# Patient Record
Sex: Female | Born: 1980 | Race: White | Hispanic: No | Marital: Single | Smoking: Former smoker
Health system: Southern US, Community
[De-identification: ages and names within clinical notes are randomized; demographics above are authoritative.]

## PROBLEM LIST (undated history)

## (undated) DIAGNOSIS — C801 Malignant (primary) neoplasm, unspecified: Secondary | ICD-10-CM

## (undated) DIAGNOSIS — G43909 Migraine, unspecified, not intractable, without status migrainosus: Secondary | ICD-10-CM

## (undated) DIAGNOSIS — F419 Anxiety disorder, unspecified: Secondary | ICD-10-CM

## (undated) DIAGNOSIS — Z9889 Other specified postprocedural states: Secondary | ICD-10-CM

## (undated) DIAGNOSIS — R112 Nausea with vomiting, unspecified: Secondary | ICD-10-CM

## (undated) HISTORY — DX: Migraine, unspecified, not intractable, without status migrainosus: G43.909

## (undated) HISTORY — PX: BILATERAL SALPINGECTOMY: SHX5743

---

## 2004-08-05 DIAGNOSIS — M5030 Other cervical disc degeneration, unspecified cervical region: Secondary | ICD-10-CM | POA: Insufficient documentation

## 2004-08-05 DIAGNOSIS — G43919 Migraine, unspecified, intractable, without status migrainosus: Secondary | ICD-10-CM | POA: Insufficient documentation

## 2006-07-23 DIAGNOSIS — N926 Irregular menstruation, unspecified: Secondary | ICD-10-CM | POA: Insufficient documentation

## 2018-07-12 DIAGNOSIS — F419 Anxiety disorder, unspecified: Secondary | ICD-10-CM | POA: Insufficient documentation

## 2019-03-18 DIAGNOSIS — F4323 Adjustment disorder with mixed anxiety and depressed mood: Secondary | ICD-10-CM | POA: Insufficient documentation

## 2020-11-12 ENCOUNTER — Ambulatory Visit (HOSPITAL_COMMUNITY)
Admission: EM | Admit: 2020-11-12 | Discharge: 2020-11-12 | Disposition: A | Discharge status: Home / Self Care | Payer: 59 | Attending: Emergency Medicine | Admitting: Emergency Medicine

## 2020-11-12 ENCOUNTER — Ambulatory Visit (INDEPENDENT_AMBULATORY_CARE_PROVIDER_SITE_OTHER): Payer: 59

## 2020-11-12 ENCOUNTER — Other Ambulatory Visit: Payer: Self-pay

## 2020-11-12 ENCOUNTER — Encounter (HOSPITAL_COMMUNITY): Payer: Self-pay

## 2020-11-12 DIAGNOSIS — M25512 Pain in left shoulder: Secondary | ICD-10-CM

## 2020-11-12 MED ORDER — KETOROLAC TROMETHAMINE 60 MG/2ML IM SOLN
60.0000 mg | Freq: Once | INTRAMUSCULAR | Status: AC
  Administered 2020-11-12: 60 mg via INTRAMUSCULAR

## 2020-11-12 MED ORDER — PREDNISONE 10 MG (21) PO TBPK: ORAL_TABLET | Freq: Every day | ORAL | 0 refills | Status: DC

## 2020-11-12 MED ORDER — KETOROLAC TROMETHAMINE 60 MG/2ML IM SOLN
INTRAMUSCULAR | Status: AC
  Filled 2020-11-12: qty 2

## 2020-11-12 NOTE — ED Triage Notes (Signed)
Pt reports left scapular pain x 3 days. States pain started at work when she was lifting some heavy boxes. Pain is worse when taking deep breath. Naproxen ibuprofen gives no relief.

## 2020-11-12 NOTE — Discharge Instructions (Addendum)
Your xray was negative you will need to follow up with orthopedic for further test and to r/o any other causes for pain  Take motrin and the steroids to help with the swelling and pain  Rest area for 3 days to help with the pain and swelling

## 2020-11-12 NOTE — ED Provider Notes (Signed)
[facility]    CSN: [id number] Arrival date & time: 11/12/20  1020      History   Chief Complaint Chief Complaint  Patient presents with   scapular pain    HPI Corrine Nagele is a 40 y.o. female.   Works at [employer] on fri was lifting above her head to get a box, began having pain to lt shoulder blade area. Not able to lift left arm well, has numbness and pain more in shoulder blade area. Has take motrin, naproxen, and ice  with no relief.   History reviewed. No pertinent past medical history.  There are no problems to display for this patient.   History reviewed. No pertinent surgical history.  OB History   No obstetric history on file.      Home Medications    Prior to Admission medications   Medication Sig Start Date End Date Taking? Authorizing Provider  predniSONE (STERAPRED UNI-PAK 21 TAB) 10 MG (21) TBPK tablet Take by mouth daily. Take 6 tabs by mouth daily  for 2 days, then 5 tabs for 2 days, then 4 tabs for 2 days, then 3 tabs for 2 days, 2 tabs for 2 days, then 1 tab by mouth daily for 2 days 11/12/20  Yes Coralyn Mark, NP    Family History History reviewed. No pertinent family history.  Social History Social History   Tobacco Use   Smoking status: Never   Smokeless tobacco: Never  Substance Use Topics   Alcohol use: Yes   Drug use: Never     Allergies   Amitriptyline and Contrast media [iodinated diagnostic agents]   Review of Systems Review of Systems  Respiratory: Negative.    Cardiovascular: Negative.   Gastrointestinal: Negative.   Genitourinary: Negative.   Musculoskeletal:  Positive for joint swelling.       Lt shoulder blade scapular area   Skin:        Swelling to lt shoulder blade area radiates to under arm pit   Neurological:  Positive for weakness and numbness.       Lt arm and shoulder blade area     Physical Exam Triage Vital Signs ED Triage Vitals  Enc Vitals Group     BP 11/12/20 1136 (!)  143/94     Pulse Rate 11/12/20 1136 69     Resp 11/12/20 1136 18     Temp 11/12/20 1136 98.6 F (37 C)     Temp Source 11/12/20 1136 Oral     SpO2 11/12/20 1136 99 %     Weight --      Height --      Head Circumference --      Peak Flow --      Pain Score 11/12/20 1134 8     Pain Loc --      Pain Edu? --      Excl. in GC? --    No data found.  Updated Vital Signs BP (!) 143/94 (BP Location: Right Arm)   Pulse 69   Temp 98.6 F (37 C) (Oral)   Resp 18   LMP  (Within Weeks) Comment: 2 1/2 weeks  SpO2 99%   Visual Acuity Right Eye Distance:   Left Eye Distance:   Bilateral Distance:    Right Eye Near:   Left Eye Near:    Bilateral Near:     Physical Exam Constitutional:      General: She is in acute distress.  Cardiovascular:  Rate and Rhythm: Normal rate.  Pulmonary:     Effort: Pulmonary effort is normal.  Abdominal:     General: Abdomen is flat.  Musculoskeletal:        General: Swelling, tenderness and signs of injury present.     Cervical back: Normal range of motion.     Comments: Decrease rom with extension to lt arm, lt shoulder scapula are slight edema, pain on palpation. Strong pulses.   Skin:    Capillary Refill: Capillary refill takes less than 2 seconds.     Findings: No erythema.  Neurological:     General: No focal deficit present.     Mental Status: She is alert.     UC Treatments / Results  Labs (all labs ordered are listed, but only abnormal results are displayed) Labs Reviewed - No data to display  EKG   Radiology DG Shoulder Left  Result Date: 11/12/2020 CLINICAL DATA:  Pain EXAM: LEFT SHOULDER - 2+ VIEW COMPARISON:  None. FINDINGS: There is no evidence of fracture or dislocation. There is no evidence of arthropathy or other focal bone abnormality. Soft tissues are unremarkable. IMPRESSION: Negative. Electronically Signed   By: Allegra Lai M.D.   On: 11/12/2020 12:42    Procedures Procedures (including critical care  time)  Medications Ordered in UC Medications  ketorolac (TORADOL) injection 60 mg (60 mg Intramuscular Given 11/12/20 1223)    Initial Impression / Assessment and Plan / UC Course  I have reviewed the triage vital signs and the nursing notes.  Pertinent labs & imaging results that were available during my care of the patient were reviewed by me and considered in my medical decision making (see chart for details).     Your xray was negative you will need to follow up with orthopedic for further test and to r/o any other causes for pain  Take motrin and the steroids to help with the swelling and pain  Final Clinical Impressions(s) / UC Diagnoses   Final diagnoses:  Pain in joint of left shoulder     Discharge Instructions      Your xray was negative you will need to follow up with orthopedic for further test and to r/o any other causes for pain  Take motrin and the steroids to help with the swelling and pain       ED Prescriptions     Medication Sig Dispense Auth. Provider   predniSONE (STERAPRED UNI-PAK 21 TAB) 10 MG (21) TBPK tablet Take by mouth daily. Take 6 tabs by mouth daily  for 2 days, then 5 tabs for 2 days, then 4 tabs for 2 days, then 3 tabs for 2 days, 2 tabs for 2 days, then 1 tab by mouth daily for 2 days 42 tablet Coralyn Mark, NP      PDMP not reviewed this encounter.   Coralyn Mark, NP 11/12/20 1253

## 2020-12-16 ENCOUNTER — Emergency Department (HOSPITAL_COMMUNITY)
Admission: EM | Admit: 2020-12-16 | Discharge: 2020-12-16 | Discharge status: Against Medical Advice | Payer: Managed Care, Other (non HMO) | Source: Home / Self Care

## 2021-10-08 ENCOUNTER — Other Ambulatory Visit: Payer: Self-pay | Admitting: Orthopaedic Surgery

## 2021-10-08 DIAGNOSIS — M25572 Pain in left ankle and joints of left foot: Secondary | ICD-10-CM

## 2021-10-22 ENCOUNTER — Ambulatory Visit
Admission: RE | Admit: 2021-10-22 | Discharge: 2021-10-22 | Disposition: A | Discharge status: Home / Self Care | Payer: BC Managed Care – PPO | Source: Ambulatory Visit | Attending: Emergency Medicine | Admitting: Emergency Medicine

## 2021-10-22 VITALS — BP 153/85 | HR 70 | Temp 98.5°F | Resp 18

## 2021-10-22 DIAGNOSIS — R110 Nausea: Secondary | ICD-10-CM

## 2021-10-22 DIAGNOSIS — A084 Viral intestinal infection, unspecified: Secondary | ICD-10-CM | POA: Diagnosis not present

## 2021-10-22 DIAGNOSIS — N926 Irregular menstruation, unspecified: Secondary | ICD-10-CM

## 2021-10-22 LAB — POCT URINALYSIS DIP (MANUAL ENTRY)
Bilirubin, UA: NEGATIVE
Blood, UA: NEGATIVE
Glucose, UA: NEGATIVE mg/dL
Ketones, POC UA: NEGATIVE mg/dL
Nitrite, UA: NEGATIVE
Protein Ur, POC: NEGATIVE mg/dL
Spec Grav, UA: 1.025 (ref 1.010–1.025)
Urobilinogen, UA: 0.2 E.U./dL
pH, UA: 6 (ref 5.0–8.0)

## 2021-10-22 MED ORDER — LOPERAMIDE HCL 2 MG PO TABS: 4.0000 mg | ORAL_TABLET | Freq: Four times a day (QID) | ORAL | 0 refills | Status: DC | PRN

## 2021-10-22 MED ORDER — ONDANSETRON 8 MG PO TBDP: 8.0000 mg | ORAL_TABLET | Freq: Three times a day (TID) | ORAL | 0 refills | Status: DC | PRN

## 2021-10-22 NOTE — Discharge Instructions (Signed)
Your urinalysis was not concerning for any signs of dehydration or urinary tract infection.  I sent a prescription for Zofran to your pharmacy that you can take every 8 hours as needed for nausea.  I sent a prescription for loperamide that you can take with every episode of diarrhea.  This can be taken up to 4 times daily.  Please do not take this medication unless you have an episode of diarrhea.  Please follow-up with either your primary care provider or your gynecologist regarding your irregular periods.  Thank you for visiting urgent care today.

## 2021-10-22 NOTE — ED Provider Notes (Signed)
[facility]    CSN: [id number] Arrival date & time: 10/22/21  1500    HISTORY   Chief Complaint  Patient presents with   Diarrhea    Iregular menstrual bleeding Vomiting - Entered by patient   Emesis   HPI Vanessa Murphy is a pleasant, 41 y.o. female who presents to urgent care today. Pt c/o intermittent episodes of vomiting and diarrhea for the past 6 days.  Patient states she threw up 3 times and had 4 episodes of diarrhea yesterday but adds that she is able to keep fluids down at this time.  Patient states she is only feeling nauseated currently.  Patient states she took some nausea medication around 9:30 AM today.  Patient also complains of menstrual bleeding beginning on 8/16, lasting for a few days then starting again on 9/1.  Patient states she currently is still spotting.  The history is provided by the patient.   History reviewed. No pertinent past medical history. There are no problems to display for this patient.  History reviewed. No pertinent surgical history. OB History   No obstetric history on file.    Home Medications    Prior to Admission medications   Not on File    Family History History reviewed. No pertinent family history. Social History Social History   Tobacco Use   Smoking status: Never   Smokeless tobacco: Never  Substance Use Topics   Alcohol use: Yes   Drug use: Never   Allergies   Amitriptyline and Contrast media [iodinated contrast media]  Review of Systems Review of Systems Pertinent findings revealed after performing a 14 point review of systems has been noted in the history of present illness.  Physical Exam Triage Vital Signs ED Triage Vitals  Enc Vitals Group     BP 12/07/20 0827 (!) 147/82     Pulse Rate 12/07/20 0827 72     Resp 12/07/20 0827 18     Temp 12/07/20 0827 98.3 F (36.8 C)     Temp Source 12/07/20 0827 Oral     SpO2 12/07/20 0827 98 %     Weight --      Height --      Head  Circumference --      Peak Flow --      Pain Score 12/07/20 0826 5     Pain Loc --      Pain Edu? --      Excl. in GC? --   No data found.  Updated Vital Signs BP (!) 153/85 (BP Location: Left Arm)   Pulse 70   Temp 98.5 F (36.9 C) (Oral)   Resp 18   SpO2 96%   Physical Exam Vitals and nursing note reviewed.  Constitutional:      General: She is not in acute distress.    Appearance: Normal appearance. She is not ill-appearing.  HENT:     Head: Normocephalic and atraumatic.  Eyes:     General: Lids are normal.        Right eye: No discharge.        Left eye: No discharge.     Extraocular Movements: Extraocular movements intact.     Conjunctiva/sclera: Conjunctivae normal.     Right eye: Right conjunctiva is not injected.     Left eye: Left conjunctiva is not injected.  Neck:     Trachea: Trachea and phonation normal.  Cardiovascular:     Rate and Rhythm: Normal rate and regular rhythm.  Pulses: Normal pulses.     Heart sounds: Normal heart sounds. No murmur heard.    No friction rub. No gallop.  Pulmonary:     Effort: Pulmonary effort is normal. No accessory muscle usage, prolonged expiration or respiratory distress.     Breath sounds: Normal breath sounds. No stridor, decreased air movement or transmitted upper airway sounds. No decreased breath sounds, wheezing, rhonchi or rales.  Chest:     Chest wall: No tenderness.  Abdominal:     General: Abdomen is flat. Bowel sounds are normal. There is no distension.     Palpations: Abdomen is soft. There is no mass.     Tenderness: There is no abdominal tenderness. There is no right CVA tenderness, left CVA tenderness, guarding or rebound.     Hernia: No hernia is present.  Musculoskeletal:        General: Normal range of motion.     Cervical back: Normal range of motion and neck supple. Normal range of motion.  Lymphadenopathy:     Cervical: No cervical adenopathy.  Skin:    General: Skin is warm and dry.      Findings: No erythema or rash.  Neurological:     General: No focal deficit present.     Mental Status: She is alert and oriented to person, place, and time.  Psychiatric:        Mood and Affect: Mood normal.        Behavior: Behavior normal.     Visual Acuity Right Eye Distance:   Left Eye Distance:   Bilateral Distance:    Right Eye Near:   Left Eye Near:    Bilateral Near:     UC Couse / Diagnostics / Procedures:     Radiology No results found.  Procedures Procedures (including critical care time) EKG  Pending results:  Labs Reviewed  POCT URINALYSIS DIP (MANUAL ENTRY) - Abnormal; Notable for the following components:      Result Value   Leukocytes, UA Trace (*)    All other components within normal limits    Medications Ordered in UC: Medications - No data to display  UC Diagnoses / Final Clinical Impressions(s)   I have reviewed the triage vital signs and the nursing notes.  Pertinent labs & imaging results that were available during my care of the patient were reviewed by me and considered in my medical decision making (see chart for details).    Final diagnoses:  Nausea without vomiting  Viral gastroenteritis  Irregular menses   Urinalysis was unremarkable.  Patient provided with Zofran and Immodium for nausea, vomiting and diarrhea.  Patient advised to follow-up with her gynecologist regarding menstrual irregularities.  Return precautions advised.  ED Prescriptions     Medication Sig Dispense Auth. Provider   ondansetron (ZOFRAN-ODT) 8 MG disintegrating tablet Take 1 tablet (8 mg total) by mouth every 8 (eight) hours as needed for nausea or vomiting. 20 tablet Lynden Oxford Scales, PA-C   loperamide (IMODIUM A-D) 2 MG tablet Take 2 tablets (4 mg total) by mouth 4 (four) times daily as needed for diarrhea or loose stools. 30 tablet Lynden Oxford Scales, PA-C      PDMP not reviewed this encounter.  Pending results:  Labs Reviewed  POCT  URINALYSIS DIP (MANUAL ENTRY) - Abnormal; Notable for the following components:      Result Value   Leukocytes, UA Trace (*)    All other components within normal limits    Discharge Instructions:  Discharge Instructions      Your urinalysis was not concerning for any signs of dehydration or urinary tract infection.  I sent a prescription for Zofran to your pharmacy that you can take every 8 hours as needed for nausea.  I sent a prescription for loperamide that you can take with every episode of diarrhea.  This can be taken up to 4 times daily.  Please do not take this medication unless you have an episode of diarrhea.  Please follow-up with either your primary care provider or your gynecologist regarding your irregular periods.  Thank you for visiting urgent care today.      Disposition Upon Discharge:  Condition: stable for discharge home  Patient presented with an acute illness with associated systemic symptoms and significant discomfort requiring urgent management. In my opinion, this is a condition that a prudent lay person (someone who possesses an average knowledge of health and medicine) may potentially expect to result in complications if not addressed urgently such as respiratory distress, impairment of bodily function or dysfunction of bodily organs.   Routine symptom specific, illness specific and/or disease specific instructions were discussed with the patient and/or caregiver at length.   As such, the patient has been evaluated and assessed, work-up was performed and treatment was provided in alignment with urgent care protocols and evidence based medicine.  Patient/parent/caregiver has been advised that the patient may require follow up for further testing and treatment if the symptoms continue in spite of treatment, as clinically indicated and appropriate.  Patient/parent/caregiver has been advised to return to the [facility] or PCP if no better; to PCP or the Emergency  Department if new signs and symptoms develop, or if the current signs or symptoms continue to change or worsen for further workup, evaluation and treatment as clinically indicated and appropriate  The patient will follow up with their current PCP if and as advised. If the patient does not currently have a PCP we will assist them in obtaining one.   The patient may need specialty follow up if the symptoms continue, in spite of conservative treatment and management, for further workup, evaluation, consultation and treatment as clinically indicated and appropriate.   Patient/parent/caregiver verbalized understanding and agreement of plan as discussed.  All questions were addressed during visit.  Please see discharge instructions below for further details of plan.  This office note has been dictated using Teaching laboratory technician.  Unfortunately, this method of dictation can sometimes lead to typographical or grammatical errors.  I apologize for your inconvenience in advance if this occurs.  Please do not hesitate to reach out to me if clarification is needed.      Theadora Rama Scales, PA-C 10/22/21 1553

## 2021-10-22 NOTE — ED Triage Notes (Addendum)
Pt c/o vomiting and diarrhea intermittent since last Thursday. States vomit x3 and diarrhea x4 today. States able to keep fluids down. States having nausea now. Took nausea meds at 9:30am.  Pt states having irregular menstrual cycle this month. States last cycle was 8/16 and started again on 9/1 and still bleeding some.

## 2021-10-23 ENCOUNTER — Ambulatory Visit
Admission: RE | Admit: 2021-10-23 | Discharge: 2021-10-23 | Disposition: A | Discharge status: Home / Self Care | Payer: 59 | Source: Ambulatory Visit | Attending: Orthopaedic Surgery | Admitting: Orthopaedic Surgery

## 2021-10-23 DIAGNOSIS — M25572 Pain in left ankle and joints of left foot: Secondary | ICD-10-CM

## 2022-01-16 IMAGING — DX DG SHOULDER 2+V*L*
3 series · 3 of 3 positions shown · non-contrast
Comparison: None.

CLINICAL DATA: Pain

EXAM:
LEFT SHOULDER - 2+ VIEW

[shoulder ap]
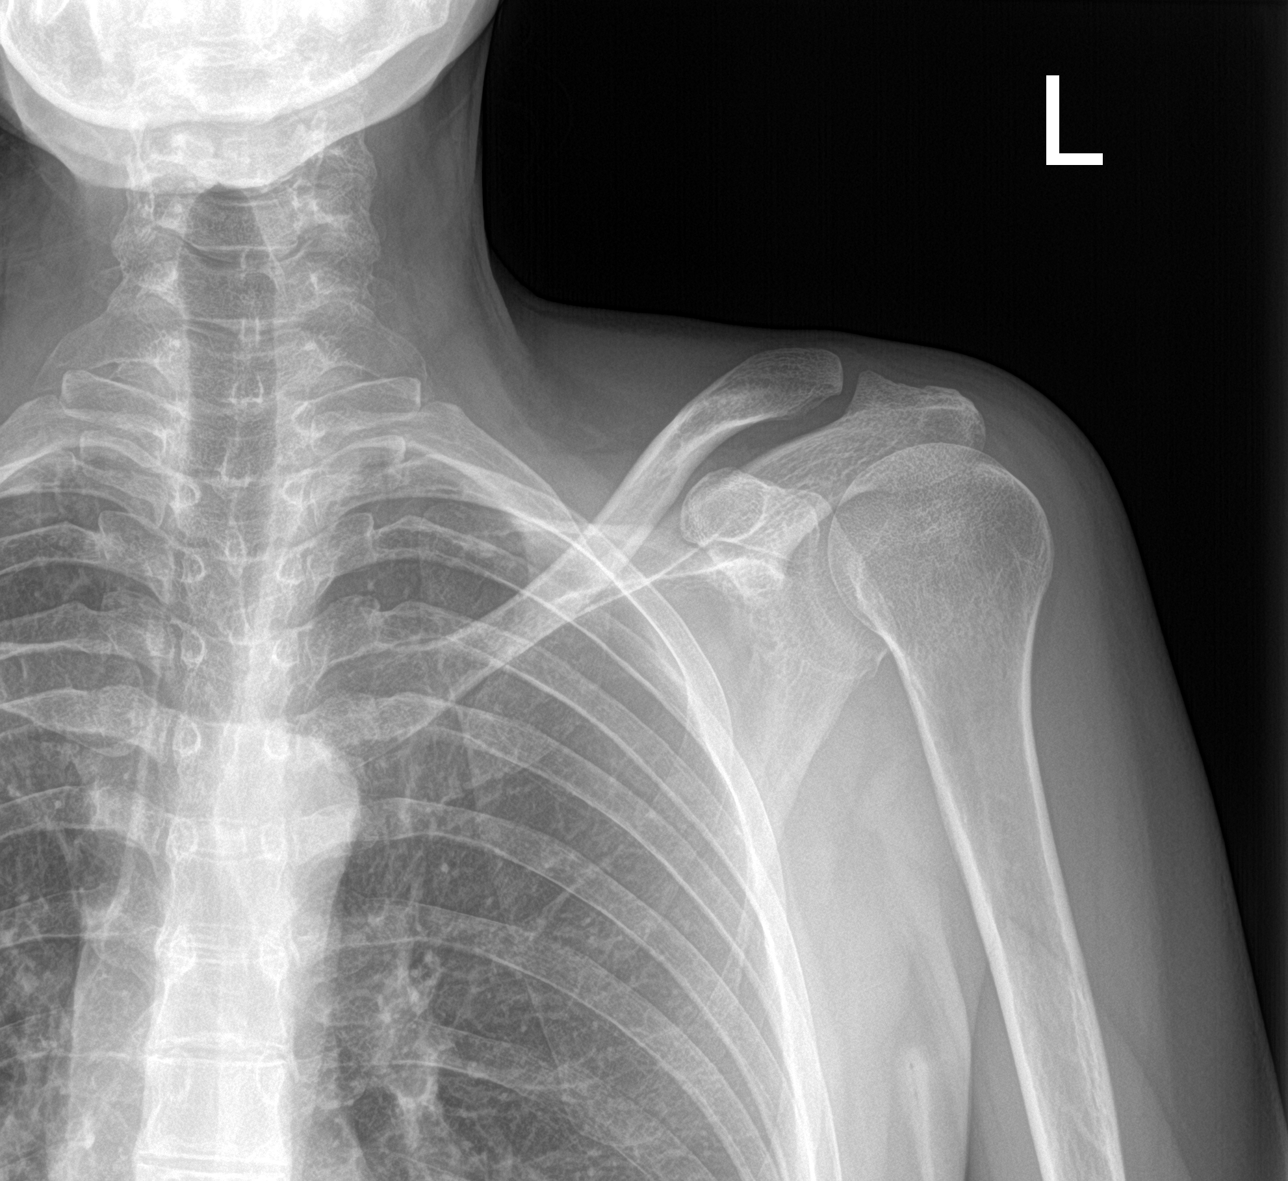

[shoulder grashey]
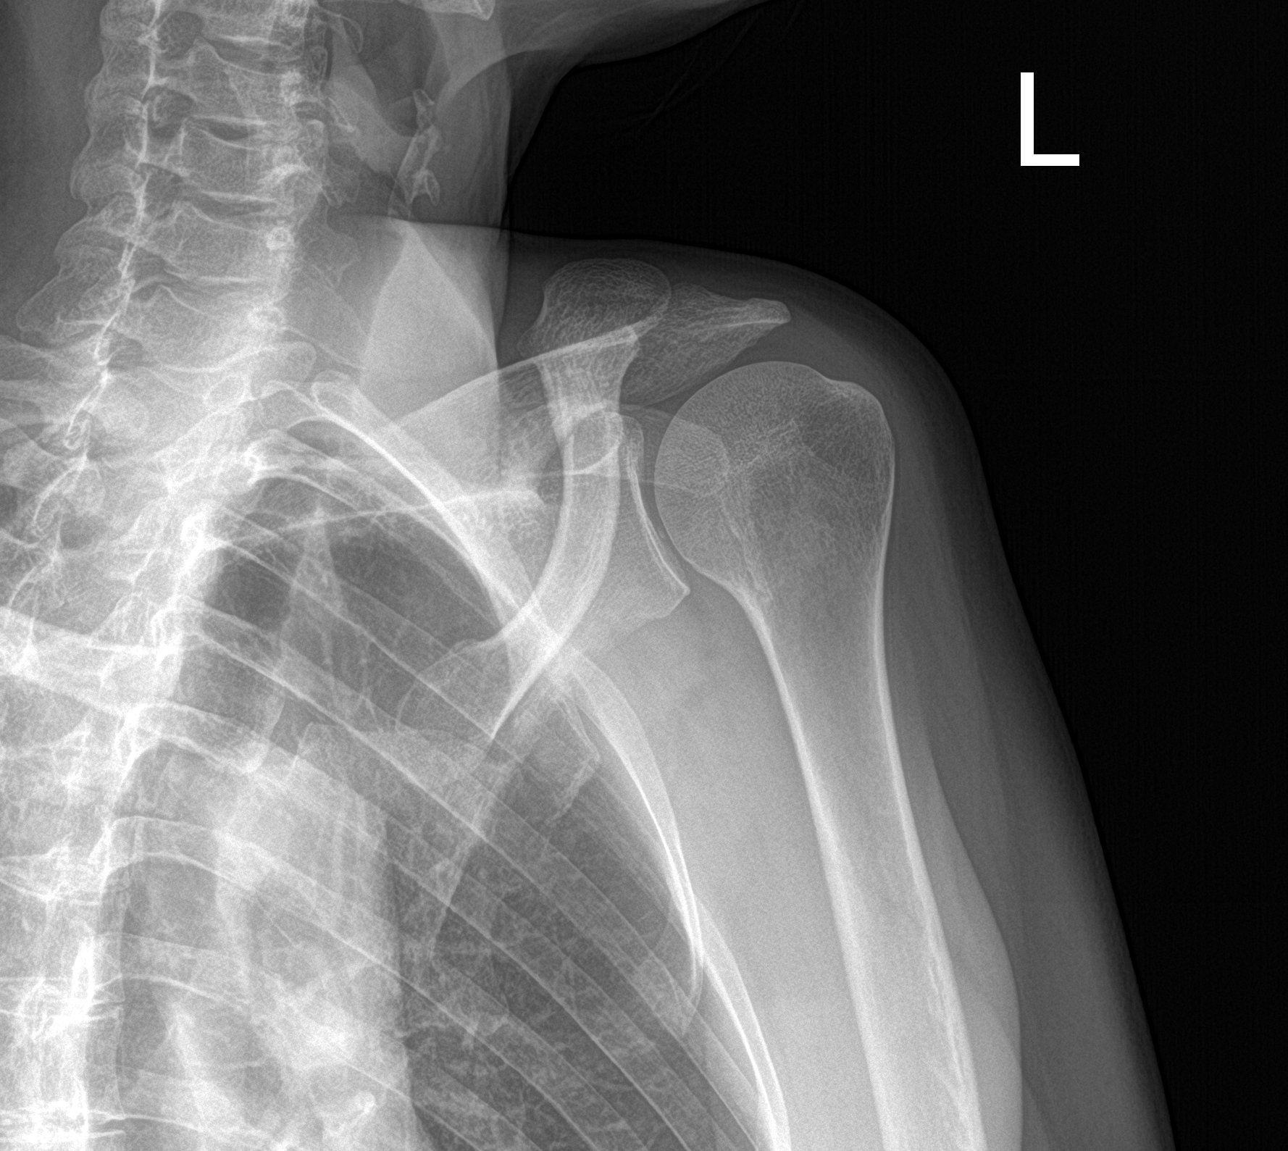

[shoulder y-view]
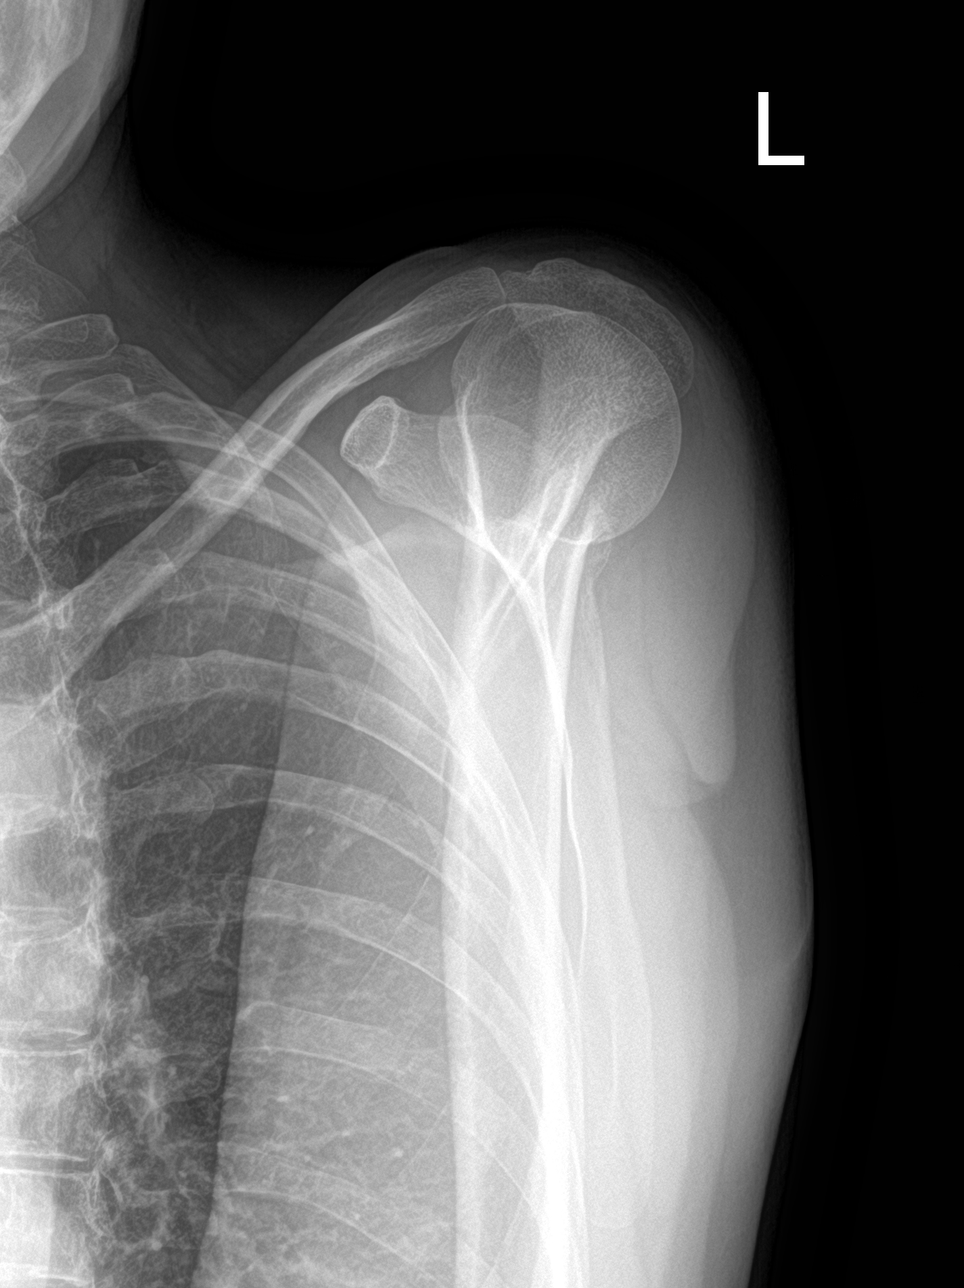

[3 of 3 positions shown; findings below may reference images not displayed]

FINDINGS: There is no evidence of fracture or dislocation. There is no
evidence of arthropathy or other focal bone abnormality. Soft
tissues are unremarkable.
IMPRESSION: Negative.

## 2022-04-10 ENCOUNTER — Other Ambulatory Visit: Payer: Self-pay | Admitting: *Deleted

## 2022-04-10 DIAGNOSIS — N6310 Unspecified lump in the right breast, unspecified quadrant: Secondary | ICD-10-CM

## 2022-04-11 ENCOUNTER — Other Ambulatory Visit: Payer: Self-pay | Admitting: *Deleted

## 2022-04-11 DIAGNOSIS — M810 Age-related osteoporosis without current pathological fracture: Secondary | ICD-10-CM

## 2022-04-24 ENCOUNTER — Other Ambulatory Visit: Payer: Self-pay | Admitting: *Deleted

## 2022-04-24 ENCOUNTER — Ambulatory Visit
Admission: RE | Admit: 2022-04-24 | Discharge: 2022-04-24 | Disposition: A | Discharge status: Home / Self Care | Payer: BC Managed Care – PPO | Source: Ambulatory Visit | Attending: *Deleted | Admitting: *Deleted

## 2022-04-24 DIAGNOSIS — N6310 Unspecified lump in the right breast, unspecified quadrant: Secondary | ICD-10-CM

## 2022-04-25 ENCOUNTER — Ambulatory Visit
Admission: RE | Admit: 2022-04-25 | Discharge: 2022-04-25 | Disposition: A | Discharge status: Home / Self Care | Payer: BC Managed Care – PPO | Source: Ambulatory Visit | Attending: *Deleted | Admitting: *Deleted

## 2022-04-25 DIAGNOSIS — N6310 Unspecified lump in the right breast, unspecified quadrant: Secondary | ICD-10-CM

## 2022-04-25 HISTORY — PX: BREAST BIOPSY: SHX20

## 2022-05-01 ENCOUNTER — Other Ambulatory Visit: Payer: BC Managed Care – PPO

## 2022-05-05 NOTE — Progress Notes (Signed)
Radiation Oncology         ([phone]) [phone] ________________________________  Name: Vanessa Murphy        MRN: [id number]  Date of Service: 05/07/2022 DOB: 04/22/1980  CC:Pcp, No  Jovita Kussmaul, MD     REFERRING PHYSICIAN: Autumn Messing III, MD   DIAGNOSIS: There were no encounter diagnoses.   HISTORY OF PRESENT ILLNESS: Vanessa Murphy is a 42 y.o. female seen in the multidisciplinary breast clinic for a new diagnosis of right breast cancer. The patient presented with two palpable abnormalities in the right breast. Diagnostic mammogram and ultrasound on 04/24/22 showed a suspicious mass in the 3 o'clock position measuring 2.2 cm. A 3 mm contiguous nodule is suspicious for satellite lesion. Patient accordingly underwent a core needle biopsy of the right breast on 04/25/22. 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 Prognostics showed estrogen receptor at 40% positive with weak staining intensity and progesterone receptor negative at 0%. HER2 was negative and the proliferation marker Ki^7 was 80%.   She is seen today to discuss treatment recommendations of her cancer.      PREVIOUS RADIATION THERAPY: {EXAM; YES/NO:19492::"No"}   PAST MEDICAL HISTORY: No past medical history on file.     PAST SURGICAL HISTORY: Past Surgical History:  Procedure Laterality Date   BREAST BIOPSY Right 04/25/2022   [place] RT BREAST BX W LOC DEV 1ST LESION IMG BX SPEC US GUIDE 04/25/2022 [facility]     FAMILY HISTORY: No family history on file.   SOCIAL HISTORY:  reports that she has never smoked. She has never used smokeless tobacco. She reports current alcohol use. She reports that she does not use drugs.   ALLERGIES: Amitriptyline and Contrast media [iodinated contrast media]   MEDICATIONS:  Current Outpatient Medications  Medication Sig Dispense Refill   loperamide (IMODIUM A-D) 2 MG tablet Take 2 tablets (4 mg total) by mouth 4 (four) times daily as needed for diarrhea or loose  stools. 30 tablet 0   ondansetron (ZOFRAN-ODT) 8 MG disintegrating tablet Take 1 tablet (8 mg total) by mouth every 8 (eight) hours as needed for nausea or vomiting. 20 tablet 0   No current facility-administered medications for this visit.     REVIEW OF SYSTEMS: On review of systems, the patient reports that she is doing well overall. No breast specific complaints are verbalized.  ***      PHYSICAL EXAM:  Wt Readings from Last 3 Encounters:  No data found for Wt   Temp Readings from Last 3 Encounters:  10/22/21 98.5 F (36.9 C) (Oral)  11/12/20 98.6 F (37 C) (Oral)   BP Readings from Last 3 Encounters:  10/22/21 (!) 153/85  11/12/20 (!) 143/94   Pulse Readings from Last 3 Encounters:  10/22/21 70  11/12/20 69    In general this is a well appearing *** female in no acute distress. She's alert and oriented x4 and appropriate throughout the examination. Cardiopulmonary assessment is negative for acute distress and she exhibits normal effort. Bilateral breast exam is deferred.    ECOG = ***  0 - Asymptomatic (Fully active, able to carry on all predisease activities without restriction)  1 - Symptomatic but completely ambulatory (Restricted in physically strenuous activity but ambulatory and able to carry out work of a light or sedentary nature. For example, light housework, office work)  2 - Symptomatic, <50% in bed during the day (Ambulatory and capable of all self care but unable to carry out any work activities. Up and  about more than 50% of waking hours)  3 - Symptomatic, >50% in bed, but not bedbound (Capable of only limited self-care, confined to bed or chair 50% or more of waking hours)  4 - Bedbound (Completely disabled. Cannot carry on any self-care. Totally confined to bed or chair)  5 - Death   Eustace Pen MM, Creech RH, Tormey DC, et al. [phone]). "Toxicity and response criteria of the [facility]". [place] Oncol. 5 (6):  649-55    LABORATORY DATA:  No results found for: "WBC", "HGB", "HCT", "MCV", "PLT" No results found for: "NA", "K", "CL", "CO2" No results found for: "ALT", "AST", "GGT", "ALKPHOS", "BILITOT"    RADIOGRAPHY: [place] RT BREAST BX W LOC DEV 1ST LESION IMG BX SPEC US GUIDE  Addendum Date: 04/29/2022   ADDENDUM REPORT: 04/29/2022 15:47 ADDENDUM: Pathology revealed GRADE 3 INVASIVE DUCTAL CARCINOMA of the RIGHT breast, needle core biopsy, 3 o'clock, (heart clip). This was found to be concordant by Dr. Lovey Newcomer. Pathology results were discussed with the patient by telephone. The patient reported doing well after the biopsy with tenderness at the site. Post biopsy instructions and care were reviewed and questions were answered. The patient was encouraged to call [facility] for any additional concerns. The patient was referred to [facility] on May 07, 2022, per patient request. Pathology results reported by Stacie Acres RN on 04/28/2022. Electronically Signed   By: Lovey Newcomer M.D.   On: 04/29/2022 15:47   Result Date: 04/29/2022 CLINICAL DATA:  Suspicious palpable right breast mass. EXAM: ULTRASOUND GUIDED RIGHT BREAST CORE NEEDLE BIOPSY COMPARISON:  Previous exam(s). PROCEDURE: I met with the patient and we discussed the procedure of ultrasound-guided biopsy, including benefits and alternatives. We discussed the high likelihood of a successful procedure. We discussed the risks of the procedure, including infection, bleeding, tissue injury, clip migration, and inadequate sampling. Informed written consent was given. The usual time-out protocol was performed immediately prior to the procedure. Lesion quadrant: Lower inner quadrant Using sterile technique and 1% Lidocaine as local anesthetic, under direct ultrasound visualization, a 14 gauge spring-loaded device was used to perform biopsy of right breast mass  3 o'clock position using a medial approach. At the conclusion of the procedure heart shaped tissue marker clip was deployed into the biopsy cavity. Follow up 2 view mammogram was performed and dictated separately. IMPRESSION: Ultrasound guided biopsy of right breast mass 3 o'clock position. No apparent complications. Electronically Signed: By: Lovey Newcomer M.D. On: 04/25/2022 14:28  MM CLIP PLACEMENT RIGHT  Result Date: 04/25/2022 CLINICAL DATA:  Suspicious palpable right breast mass. EXAM: 3D DIAGNOSTIC RIGHT MAMMOGRAM POST ULTRASOUND BIOPSY COMPARISON:  Previous exam(s). FINDINGS: 3D Mammographic images were obtained following ultrasound guided biopsy of palpable right breast mass. The biopsy marking clip is in expected position at the site of biopsy. IMPRESSION: Appropriate positioning of the heart shaped biopsy marking clip at the site of biopsy in the right breast mass 3 o'clock position. Final Assessment: Post Procedure Mammograms for Marker Placement Electronically Signed   By: Lovey Newcomer M.D.   On: 04/25/2022 14:30  MM DIAG BREAST TOMO BILATERAL  Result Date: 04/24/2022 CLINICAL DATA:  Two palpable abnormalities in the RIGHT breast. EXAM: DIGITAL DIAGNOSTIC BILATERAL MAMMOGRAM WITH TOMOSYNTHESIS; ULTRASOUND RIGHT BREAST LIMITED TECHNIQUE: Bilateral digital diagnostic mammography and breast tomosynthesis was performed.; Targeted ultrasound examination of the right breast was performed COMPARISON:  Baseline ACR  Breast Density Category b: There are scattered areas of fibroglandular density. FINDINGS: RIGHT BREAST: Mammogram: Within the MEDIAL aspect of the RIGHT breast, there is an oval mass with spiculated margins correlating with the palpable finding. Spot tangential view is performed in the UPPER central RIGHT breast corresponding to the second area of concern. This view shows normal appearing fibrofatty tissue. Mammographic images were processed with CAD. Physical Exam: I palpate a discrete mobile  mass in the 3 o'clock location of the RIGHT breast 4 centimeters from the nipple. In the UPPER central RIGHT breast, I palpate superficial ribs but no discrete mass. Ultrasound: Targeted ultrasound is performed, showing an oval mass with irregular margins in the 3 o'clock location of the RIGHT breast 4 centimeters from the nipple. Mass is taller than wide without significant blood flow on Doppler evaluation. Mass measures 2.2 x 1.9 x 2.0 centimeters. A small contiguous nodule measures 3 millimeters and is suspicious for satellite lesion. Evaluation of the RIGHT axilla is negative for adenopathy. LEFT BREAST: Mammogram: No suspicious mass, distortion, or microcalcifications are identified to suggest presence of malignancy. Mammographic images were processed with CAD. IMPRESSION: Suspicious mass in the 3 o'clock location of the RIGHT breast for which biopsy is recommended. RECOMMENDATION: Ultrasound-guided core biopsy of RIGHT breast mass. I have discussed the findings and recommendations with the patient. If applicable, a reminder letter will be sent to the patient regarding the next appointment. BI-RADS CATEGORY  5: Highly suggestive of malignancy. Electronically Signed   By: Nolon Nations M.D.   On: 04/24/2022 12:54  US BREAST LTD UNI RIGHT INC AXILLA  Result Date: 04/24/2022 CLINICAL DATA:  Two palpable abnormalities in the RIGHT breast. EXAM: DIGITAL DIAGNOSTIC BILATERAL MAMMOGRAM WITH TOMOSYNTHESIS; ULTRASOUND RIGHT BREAST LIMITED TECHNIQUE: Bilateral digital diagnostic mammography and breast tomosynthesis was performed.; Targeted ultrasound examination of the right breast was performed COMPARISON:  Baseline ACR Breast Density Category b: There are scattered areas of fibroglandular density. FINDINGS: RIGHT BREAST: Mammogram: Within the MEDIAL aspect of the RIGHT breast, there is an oval mass with spiculated margins correlating with the palpable finding. Spot tangential view is performed in the UPPER  central RIGHT breast corresponding to the second area of concern. This view shows normal appearing fibrofatty tissue. Mammographic images were processed with CAD. Physical Exam: I palpate a discrete mobile mass in the 3 o'clock location of the RIGHT breast 4 centimeters from the nipple. In the UPPER central RIGHT breast, I palpate superficial ribs but no discrete mass. Ultrasound: Targeted ultrasound is performed, showing an oval mass with irregular margins in the 3 o'clock location of the RIGHT breast 4 centimeters from the nipple. Mass is taller than wide without significant blood flow on Doppler evaluation. Mass measures 2.2 x 1.9 x 2.0 centimeters. A small contiguous nodule measures 3 millimeters and is suspicious for satellite lesion. Evaluation of the RIGHT axilla is negative for adenopathy. LEFT BREAST: Mammogram: No suspicious mass, distortion, or microcalcifications are identified to suggest presence of malignancy. Mammographic images were processed with CAD. IMPRESSION: Suspicious mass in the 3 o'clock location of the RIGHT breast for which biopsy is recommended. RECOMMENDATION: Ultrasound-guided core biopsy of RIGHT breast mass. I have discussed the findings and recommendations with the patient. If applicable, a reminder letter will be sent to the patient regarding the next appointment. BI-RADS CATEGORY  5: Highly suggestive of malignancy. Electronically Signed   By: Nolon Nations M.D.   On: 04/24/2022 12:54      IMPRESSION/PLAN: 1. *** Dr. Lisbeth Renshaw discussed the  pathology findings and reviewed the nature of ***. The consensus from the breast conference includes ***. Dr. Lisbeth Renshaw recommends external beam radiotherapy to the breast following her ***lumpectomy to reduce risks of local recurrence followed by antiestrogen therapy. We discussed the risks, benefits, short, and long term effects of radiotherapy, as well as the *** intent, and the patient is interested in proceeding. Dr. Lisbeth Renshaw discussed the  delivery and logistics of radiotherapy and anticipates a course of *** weeks of radiotherapy to the *** breast with *** deep inspiration breath hold technique. We will see her back a few weeks after surgery to discuss the simulation process and anticipate starting radiotherapy about 4-6 weeks after surgery.   2. Possible genetic predisposition to malignancy. The patient is a candidate for genetic testing given *** personal and family history. She will meet with our geneticist today in clinic.   In a visit lasting *** minutes, greater than 50% of the time was spent face to face reviewing her case, as well as in preparation of, discussing, and coordinating the patient's care.  The above documentation reflects my direct findings during this shared patient visit. Please see the separate note by Dr. Lisbeth Renshaw on this date for the remainder of the patient's plan of care.    Leona Singleton, [place]    **Disclaimer: This note was dictated with voice recognition software. Similar sounding words can inadvertently be transcribed and this note may contain transcription errors which may not have been corrected upon publication of note.**

## 2022-05-06 ENCOUNTER — Encounter: Payer: Self-pay | Admitting: *Deleted

## 2022-05-06 DIAGNOSIS — Z170 Estrogen receptor positive status [ER+]: Secondary | ICD-10-CM

## 2022-05-07 ENCOUNTER — Ambulatory Visit: Payer: Self-pay | Admitting: General Surgery

## 2022-05-07 ENCOUNTER — Encounter: Payer: Self-pay | Admitting: Physical Therapy

## 2022-05-07 ENCOUNTER — Inpatient Hospital Stay (HOSPITAL_BASED_OUTPATIENT_CLINIC_OR_DEPARTMENT_OTHER): Payer: BC Managed Care – PPO | Admitting: Hematology and Oncology

## 2022-05-07 ENCOUNTER — Inpatient Hospital Stay (HOSPITAL_BASED_OUTPATIENT_CLINIC_OR_DEPARTMENT_OTHER): Payer: BC Managed Care – PPO | Admitting: Genetic Counselor

## 2022-05-07 ENCOUNTER — Ambulatory Visit: Payer: BC Managed Care – PPO | Attending: General Surgery | Admitting: Physical Therapy

## 2022-05-07 ENCOUNTER — Telehealth: Payer: Self-pay | Admitting: Hematology and Oncology

## 2022-05-07 ENCOUNTER — Encounter: Payer: Self-pay | Admitting: *Deleted

## 2022-05-07 ENCOUNTER — Ambulatory Visit
Admission: RE | Admit: 2022-05-07 | Discharge: 2022-05-07 | Disposition: A | Discharge status: Home / Self Care | Payer: BC Managed Care – PPO | Source: Ambulatory Visit | Attending: Radiation Oncology | Admitting: Radiation Oncology

## 2022-05-07 ENCOUNTER — Inpatient Hospital Stay: Payer: BC Managed Care – PPO | Admitting: Licensed Clinical Social Worker

## 2022-05-07 ENCOUNTER — Other Ambulatory Visit: Payer: Self-pay

## 2022-05-07 ENCOUNTER — Inpatient Hospital Stay: Payer: BC Managed Care – PPO | Attending: Hematology and Oncology

## 2022-05-07 ENCOUNTER — Encounter: Payer: Self-pay | Admitting: Hematology and Oncology

## 2022-05-07 ENCOUNTER — Encounter: Payer: Self-pay | Admitting: Genetic Counselor

## 2022-05-07 VITALS — BP 139/82 | HR 77 | Temp 97.9°F | Resp 16 | Ht 64.0 in | Wt 135.9 lb

## 2022-05-07 DIAGNOSIS — Z803 Family history of malignant neoplasm of breast: Secondary | ICD-10-CM | POA: Diagnosis not present

## 2022-05-07 DIAGNOSIS — Z808 Family history of malignant neoplasm of other organs or systems: Secondary | ICD-10-CM | POA: Insufficient documentation

## 2022-05-07 DIAGNOSIS — Z170 Estrogen receptor positive status [ER+]: Secondary | ICD-10-CM | POA: Insufficient documentation

## 2022-05-07 DIAGNOSIS — Z87891 Personal history of nicotine dependence: Secondary | ICD-10-CM | POA: Insufficient documentation

## 2022-05-07 DIAGNOSIS — Z483 Aftercare following surgery for neoplasm: Secondary | ICD-10-CM | POA: Diagnosis not present

## 2022-05-07 DIAGNOSIS — C50211 Malignant neoplasm of upper-inner quadrant of right female breast: Secondary | ICD-10-CM

## 2022-05-07 DIAGNOSIS — R293 Abnormal posture: Secondary | ICD-10-CM | POA: Diagnosis not present

## 2022-05-07 DIAGNOSIS — C50311 Malignant neoplasm of lower-inner quadrant of right female breast: Secondary | ICD-10-CM | POA: Diagnosis not present

## 2022-05-07 LAB — CBC WITH DIFFERENTIAL (CANCER CENTER ONLY)
Abs Immature Granulocytes: 0.03 10*3/uL (ref 0.00–0.07)
Basophils Absolute: 0 10*3/uL (ref 0.0–0.1)
Basophils Relative: 1 %
Eosinophils Absolute: 0.1 10*3/uL (ref 0.0–0.5)
Eosinophils Relative: 1 %
HCT: 41.1 % (ref 36.0–46.0)
Hemoglobin: 14.3 g/dL (ref 12.0–15.0)
Immature Granulocytes: 0 %
Lymphocytes Relative: 21 %
Lymphs Abs: 1.6 10*3/uL (ref 0.7–4.0)
MCH: 31.6 pg (ref 26.0–34.0)
MCHC: 34.8 g/dL (ref 30.0–36.0)
MCV: 90.9 fL (ref 80.0–100.0)
Monocytes Absolute: 0.6 10*3/uL (ref 0.1–1.0)
Monocytes Relative: 8 %
Neutro Abs: 5.2 10*3/uL (ref 1.7–7.7)
Neutrophils Relative %: 69 %
Platelet Count: 258 10*3/uL (ref 150–400)
RBC: 4.52 MIL/uL (ref 3.87–5.11)
RDW: 12.5 % (ref 11.5–15.5)
WBC Count: 7.6 10*3/uL (ref 4.0–10.5)
nRBC: 0 % (ref 0.0–0.2)

## 2022-05-07 LAB — CMP (CANCER CENTER ONLY)
ALT: 13 U/L (ref 0–44)
AST: 13 U/L — ABNORMAL LOW (ref 15–41)
Albumin: 4.8 g/dL (ref 3.5–5.0)
Alkaline Phosphatase: 50 U/L (ref 38–126)
Anion gap: 6 (ref 5–15)
BUN: 11 mg/dL (ref 6–20)
CO2: 26 mmol/L (ref 22–32)
Calcium: 10.1 mg/dL (ref 8.9–10.3)
Chloride: 105 mmol/L (ref 98–111)
Creatinine: 0.72 mg/dL (ref 0.44–1.00)
GFR, Estimated: >60 mL/min (ref >60)
Glucose, Bld: 101 mg/dL — ABNORMAL HIGH (ref 70–99)
Potassium: 4.5 mmol/L (ref 3.5–5.1)
Sodium: 137 mmol/L (ref 135–145)
Total Bilirubin: 0.7 mg/dL (ref 0.3–1.2)
Total Protein: 7.5 g/dL (ref 6.5–8.1)

## 2022-05-07 LAB — GENETIC SCREENING ORDER

## 2022-05-07 NOTE — Therapy (Signed)
OUTPATIENT PHYSICAL THERAPY BREAST CANCER BASELINE EVALUATION   Patient Name: Vanessa Murphy MRN: [id number] DOB:[birth year], 42 y.o., female Today's Date: 05/07/2022  END OF SESSION:  PT End of Session - 05/07/22 1046     Visit Number 1    Number of Visits 2    Date for PT Re-Evaluation 11/07/22    PT Start Time 0945    PT Stop Time 1010   Also saw pt from 1140 to 1148 for a total of 33 minutes   PT Time Calculation (min) 25 min    Activity Tolerance Patient tolerated treatment well    Behavior During Therapy [facility] for tasks assessed/performed             Past Medical History:  Diagnosis Date   Migraines    Past Surgical History:  Procedure Laterality Date   BREAST BIOPSY Right 04/25/2022   [place] RT BREAST BX W LOC DEV 1ST LESION IMG BX [place] US GUIDE 04/25/2022 [facility] MAMMOGRAPHY   Patient Active Problem List   Diagnosis Date Noted   Malignant neoplasm of upper-inner quadrant of right breast in female, estrogen receptor positive ([place]) 05/06/2022    REFERRING PROVIDER: Dr. Autumn Messing  REFERRING DIAG: Right breast cancer  THERAPY DIAG:  Malignant neoplasm of lower-inner quadrant of right breast of female, estrogen receptor positive ([place])  Abnormal posture  Rationale for Evaluation and Treatment: Rehabilitation  ONSET DATE: 04/24/2022  SUBJECTIVE:                                                                                                                                                                                           SUBJECTIVE STATEMENT: Patient reports she is here today to be seen by her medical team for her newly diagnosed right breast cancer.   PERTINENT HISTORY:  Patient was diagnosed on 04/24/2022 with right grade 3 invasive ductal carcinoma breast cancer. It measures 2.2 cm and is located in the lower inner quadrant. It is weakly ER positive, PR and HER2 negative with a Ki67 of 80%. She smokes 1/2 pack per day.  PATIENT GOALS:   reduce lymphedema  risk and learn post op HEP.   PAIN:  Are you having pain? No  PRECAUTIONS: Active CA   HAND DOMINANCE: left  WEIGHT BEARING RESTRICTIONS: No  FALLS:  Has patient fallen in last 6 months? 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  LEISURE: She does not exercise  PRIOR LEVEL OF FUNCTION: Independent   OBJECTIVE:  COGNITION: Overall cognitive  status: Within functional limits for tasks assessed    POSTURE:  Forward head and rounded shoulders posture  UPPER EXTREMITY AROM/PROM:  A/PROM RIGHT   eval   Shoulder extension 41  Shoulder flexion 155  Shoulder abduction 168  Shoulder internal rotation 64  Shoulder external rotation 83    (Blank rows = not tested)  A/PROM LEFT   eval  Shoulder extension 50  Shoulder flexion 152  Shoulder abduction 165  Shoulder internal rotation 57  Shoulder external rotation 88    (Blank rows = not tested)  CERVICAL AROM: All within normal limits   UPPER EXTREMITY STRENGTH: WFL  LYMPHEDEMA ASSESSMENTS:   LANDMARK RIGHT   eval  10 cm proximal to olecranon process 24.8  Olecranon process 22.9  10 cm proximal to ulnar styloid process 19.4  Just proximal to ulnar styloid process 14  Across hand at thumb web space 17.8  At base of 2nd digit 6  (Blank rows = not tested)  LANDMARK LEFT   eval  10 cm proximal to olecranon process 26  Olecranon process 22.7  10 cm proximal to ulnar styloid process 18.6  Just proximal to ulnar styloid process 14  Across hand at thumb web space 18.4  At base of 2nd digit 5.8  (Blank rows = not tested)  L-DEX LYMPHEDEMA SCREENING:  The patient was assessed using the L-Dex machine today to produce a lymphedema index baseline score. The patient will be reassessed on a regular basis (typically every 3 months) to obtain new L-Dex scores. If  the score is > 6.5 points away from his/her baseline score indicating onset of subclinical lymphedema, it will be recommended to wear a compression garment for 4 weeks, 12 hours per day and then be reassessed. If the score continues to be > 6.5 points from baseline at reassessment, we will initiate lymphedema treatment. Assessing in this manner has a 95% rate of preventing clinically significant lymphedema.   L-DEX FLOWSHEETS - 05/07/22 1000       L-DEX LYMPHEDEMA SCREENING   Measurement Type Unilateral    L-DEX MEASUREMENT EXTREMITY Upper Extremity    POSITION  Standing    DOMINANT SIDE Left    At Risk Side Right    BASELINE SCORE (UNILATERAL) 2.6             QUICK DASH SURVEY:  Katina Dung - 05/07/22 0001     Open a tight or new jar Mild difficulty    Do heavy household chores (wash walls, wash floors) No difficulty    Carry a shopping bag or briefcase No difficulty    Wash your back No difficulty    Use a knife to cut food No difficulty    Recreational activities in which you take some force or impact through your arm, shoulder, or hand (golf, hammering, tennis) Mild difficulty    During the past week, to what extent has your arm, shoulder or hand problem interfered with your normal social activities with family, friends, neighbors, or groups? Not at all    During the past week, to what extent has your arm, shoulder or hand problem limited your work or other regular daily activities Slightly    Arm, shoulder, or hand pain. None    Tingling (pins and needles) in your arm, shoulder, or hand None    Difficulty Sleeping No difficulty    DASH Score 6.82 %              PATIENT EDUCATION:  Education details:  Lymphedema risk reduction and post op shoulder/posture HEP Person educated: Patient Education method: Explanation, Demonstration, Handout Education comprehension: Patient verbalized understanding and returned demonstration  HOME EXERCISE PROGRAM: Patient was instructed  today in a home exercise program today for post op shoulder range of motion. These included active assist shoulder flexion in sitting, scapular retraction, wall walking with shoulder abduction, and hands behind head external rotation.  She was encouraged to do these twice a day, holding 3 seconds and repeating 5 times when permitted by her physician.   ASSESSMENT:  CLINICAL IMPRESSION: Patient was diagnosed on 04/24/2022 with right grade 3 invasive ductal carcinoma breast cancer. It measures 2.2 cm and is located in the lower inner quadrant. It is weakly ER positive, PR and HER2 negative with a Ki67 of 80%. She smokes 1/2 pack per day.Her multidisciplinary medical team met prior to her assessments to determine a recommended treatment plan. She is planning to have neoadjuvant chemotherapy followed by a right lumpectomy and sentinel node biopsy and radiation. She will benefit from a post op PT reassessment to determine needs and from L-Dex screens every 3 months for 2 years to detect subclinical lymphedema.  Pt will benefit from skilled therapeutic intervention to improve on the following deficits: Decreased knowledge of precautions, impaired UE functional use, pain, decreased ROM, postural dysfunction.   PT treatment/interventions: ADL/self-care home management, pt/family education, therapeutic exercise  REHAB POTENTIAL: Excellent  CLINICAL DECISION MAKING: Stable/uncomplicated  EVALUATION COMPLEXITY: Low   GOALS: Goals reviewed with patient? YES  LONG TERM GOALS: (STG=LTG)    Name Target Date Goal status  1 Pt will be able to verbalize understanding of pertinent lymphedema risk reduction practices relevant to her dx specifically related to skin care.  Baseline:  No knowledge 05/07/2022 Achieved at eval  2 Pt will be able to return demo and/or verbalize understanding of the post op HEP related to regaining shoulder ROM. Baseline:  No knowledge 05/07/2022 Achieved at eval  3 Pt will be able  to verbalize understanding of the importance of attending the post op After Breast CA Class for further lymphedema risk reduction education and therapeutic exercise.  Baseline:  No knowledge 05/07/2022 Achieved at eval  4 Pt will demo she has regained full shoulder ROM and function post operatively compared to baselines.  Baseline: See objective measurements taken today. 11/07/2022     PLAN:  PT FREQUENCY/DURATION: EVAL and 1 follow up appointment.   PLAN FOR NEXT SESSION: will reassess 3-4 weeks post op to determine needs.   Patient will follow up at outpatient cancer rehab 3-4 weeks following surgery.  If the patient requires physical therapy at that time, a specific plan will be dictated and sent to the referring physician for approval. The patient was educated today on appropriate basic range of motion exercises to begin post operatively and the importance of attending the After Breast Cancer class following surgery.  Patient was educated today on lymphedema risk reduction practices as it pertains to recommendations that will benefit the patient immediately following surgery.  She verbalized good understanding.    Physical Therapy Information for After Breast Cancer Surgery/Treatment:  Lymphedema is a swelling condition that you may be at risk for in your arm if you have lymph nodes removed from the armpit area.  After a sentinel node biopsy, the risk is approximately 5-9% and is higher after an axillary node dissection.  There is treatment available for this condition and it is not life-threatening.  Contact your physician or physical therapist with  concerns. You may begin the 4 shoulder/posture exercises (see additional sheet) when permitted by your physician (typically a week after surgery).  If you have drains, you may need to wait until those are removed before beginning range of motion exercises.  A general recommendation is to not lift your arms above shoulder height until drains are  removed.  These exercises should be done to your tolerance and gently.  This is not a "no pain/no gain" type of recovery so listen to your body and stretch into the range of motion that you can tolerate, stopping if you have pain.  If you are having immediate reconstruction, ask your plastic surgeon about doing exercises as he or she may want you to wait. We encourage you to attend the free one time ABC (After Breast Cancer) class offered by [facility].  You will learn information related to lymphedema risk, prevention and treatment and additional exercises to regain mobility following surgery.  You can call [phone] for more information.  This is offered the 1st and 3rd Monday of each month.  You only attend the class one time. While undergoing any medical procedure or treatment, try to avoid blood pressure being taken or needle sticks from occurring on the arm on the side of cancer.   This recommendation begins after surgery and continues for the rest of your life.  This may help reduce your risk of getting lymphedema (swelling in your arm). An excellent resource for those seeking information on lymphedema is the National Lymphedema Network's web site. It can be accessed at [place].org If you notice swelling in your hand, arm or breast at any time following surgery (even if it is many years from now), please contact your doctor or physical therapist to discuss this.  Lymphedema can be treated at any time but it is easier for you if it is treated early on.  If you feel like your shoulder motion is not returning to normal in a reasonable amount of time, please contact your surgeon or physical therapist.  [facility] [phone]. [address], [place] [address]  ABC CLASS After Breast Cancer Class  After Breast Cancer Class is a specially designed exercise class to assist you in a safe recover after having breast cancer  surgery.  In this class you will learn how to get back to full function whether your drains were just removed or if you had surgery a month ago.  This one-time class is held the 1st and 3rd Monday of every month from 11:00 a.m. until 12:00 noon virtually.  This class is FREE and space is limited. For more information or to register for the next available class, call [phone].  Class Goals  Understand specific stretches to improve the flexibility of you chest and shoulder. Learn ways to safely strengthen your upper body and improve your posture. Understand the warning signs of infection and why you may be at risk for an arm infection. Learn about Lymphedema and prevention.  ** You do not attend this class until after surgery.  Drains must be removed to participate  Patient was instructed today in a home exercise program today for post op shoulder range of motion. These included active assist shoulder flexion in sitting, scapular retraction, wall walking with shoulder abduction, and hands behind head external rotation.  She was encouraged to do these twice a day, holding 3 seconds and repeating 5 times when permitted by her physician.  Annia Friendly, [place] 05/07/22 12:26 PM

## 2022-05-07 NOTE — Assessment & Plan Note (Addendum)
This is a very pleasant 42 year old female patient with newly diagnosed right breast high-grade invasive ductal carcinoma, ER +40% weak staining, PR 0% negative, HER2 negative, Ki-67 of 80% referred to breast [place] for additional recommendations.  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  With tumor size larger than 2 cm, we have discussed about considering neoadjuvant chemotherapy based on keynote 522 study.  Other option would be to do neoadjuvant non anthracycline-based regimen since she has node-negative disease.  Given high-grade, high proliferation index and weak staining of estrogen receptor, I do believe this is likely to behave like a triple negative breast cancer.  Pathology and radiology counseling: Discussed with the patient, the details of pathology including the type of breast cancer,the clinical staging, the significance of ER, PR and HER-2/neu receptors and the implications for treatment. After reviewing the pathology in detail, we proceeded to discuss the different treatment options between surgery, radiation, chemotherapy, antiestrogen therapies.   Recommendation  1. Neoadjuvant chemotherapy with  Taxol weekly 12 with carboplatin (weekly or every 3 weeks) and Keytruda every 3 weeks x4, followed by Adriamycin and Cytoxan with Keytruda every 3 weeks followed by Beryle Flock maintenance for 1 year ( Adjuvant 9 cycles) 2. Followed by breast conserving surgery with sentinel lymph node study 3. Followed by adjuvant radiation therapy 4.  Followed by adjuvant antiestrogen therapy.  We can consider repeating prognostics if there is residual cancer specimen to confirm the weak ER positivity.  If she is ER negative on the residual tumor, we will omit antiestrogen therapy.   Chemotherapy Counseling: I discussed the risks and benefits of chemotherapy including the risks of nausea/ vomiting, risk of infection from low WBC count, fatigue  due to chemo or anemia, bruising or bleeding due to low platelets, mouth sores, loss/ change in taste and decreased appetite. Liver and kidney function will be monitored through out chemotherapy as abnormalities in liver and kidney function may be a side effect of treatment.  Peripheral neuropathy due to Taxol and cardiac dysfunction due to Adriamycin was discussed in detail. Risk of permanent bone marrow dysfunction due to chemo were also discussed.  We also discussed the immune mediated adverse effects related to [facility].   Plan: 1. Port placement to be done  2. Echocardiogram 3. Chemotherapy class 4. Breast MRI  Genetic counseling will also be arranged   Return to clinic in 2 weeks to start chemotherapy.  She is agreeable to all these recommendations.

## 2022-05-07 NOTE — Progress Notes (Signed)
START ON PATHWAY REGIMEN - Breast     Cycles 1 through 4: A cycle is every 21 days:     Pembrolizumab      Paclitaxel      Carboplatin      Filgrastim-xxxx    Cycles 5 through 8: A cycle is every 21 days:     Pembrolizumab      Doxorubicin      Cyclophosphamide      Pegfilgrastim-xxxx   **Always confirm dose/schedule in your pharmacy ordering system**  Patient Characteristics: Preoperative or Nonsurgical Candidate, M0 (Clinical Staging), Up to cT4c, Any N, M0, Neoadjuvant Therapy followed by Surgery, Invasive Disease, Chemotherapy, HER2 Negative, ER Negative, Platinum Therapy Indicated and Candidate for Checkpoint Inhibitor Therapeutic Status: Preoperative or Nonsurgical Candidate, M0 (Clinical Staging) AJCC M Category: cM0 AJCC Grade: G3 ER Status: Negative (-) AJCC 8 Stage Grouping: IIB HER2 Status: Negative (-) AJCC T Category: cT2 AJCC N Category: cN0 PR Status: Negative (-) Breast Surgical Plan: Neoadjuvant Therapy followed by Surgery Intent of Therapy: Curative Intent, Discussed with Patient

## 2022-05-07 NOTE — Progress Notes (Signed)
REFERRING PROVIDER: Benay Pike, MD  PRIMARY PROVIDER:  Everardo Beals, NP  PRIMARY REASON FOR VISIT:  1. Malignant neoplasm of upper-inner quadrant of right breast in female, estrogen receptor positive ([place])   2. Family history of breast cancer   3. Family history of melanoma    HISTORY OF PRESENT ILLNESS:   Vanessa Murphy, a 42 y.o. female, was seen for a [place] cancer genetics consultation during the breast multidisciplinary clinic at the request of Dr. Chryl Heck due to a personal and family history of cancer.  Vanessa Murphy presents to clinic today to discuss the possibility of a hereditary predisposition to cancer, to discuss genetic testing, and to further clarify her future cancer risks, as well as potential cancer risks for family members.   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   CANCER HISTORY:  Oncology History  Malignant neoplasm of upper-inner quadrant of right breast in female, estrogen receptor positive ([place])  04/24/2022 Mammogram   Bilateral diagnostic mammogram given palpable mass showed suspicious mass in the 3:00 location of the right breast for which biopsy was recommended.   04/25/2022 Pathology Results   Right breast needle core biopsy at 3:00 showed high-grade invasive ductal carcinoma.  Prognostic showed ER 40% positive weak staining intensity, PR 0% negative, group 5 HER2 negative and Ki-67 of 80%.   05/06/2022 Initial Diagnosis   Malignant neoplasm of upper-inner quadrant of right breast in female, estrogen receptor positive ([place])   05/21/2022 - 05/21/2022 Chemotherapy   Patient is on Treatment Plan : BREAST Pembrolizumab (200) D1 + Carboplatin (5) D1 + Paclitaxel (80) D1,8,15 q21d X 4 cycles / Pembrolizumab (200) D1 + AC D1 q21d x 4 cycles     05/21/2022 -  Chemotherapy   Patient is on Treatment Plan : BREAST Pembrolizumab (200) D1 +  Carboplatin (1.5) D1,8,15 + Paclitaxel (80) D1,8,15 q21d X 4 cycles / Pembrolizumab (200) D1 + AC D1 q21d x 4 cycles        RISK FACTORS:  Menarche was at age 38.  First live birth at age 34.  OCP use for approximately  15  years.  Ovaries intact: yes.  Uterus intact: yes.  Menopausal status: premenopausal.  HRT use: 0 years. Colonoscopy: no Mammogram within the last year: yes.  Past Medical History:  Diagnosis Date   Migraines     Past Surgical History:  Procedure Laterality Date   BREAST BIOPSY Right 04/25/2022   [place] RT BREAST BX W LOC DEV 1ST LESION IMG BX SPEC US GUIDE 04/25/2022 [facility] MAMMOGRAPHY    Social History   Socioeconomic History   Marital status: Single    Spouse name: Not on file   Number of children: Not on file   Years of education: Not on file   Highest education level: Not on file  Occupational History   Not on file  Tobacco Use   Smoking status: Former    Packs/day: .5    Types: Cigarettes    Quit date: 04/29/2022    Years since quitting: 0.0   Smokeless tobacco: Never  Substance and Sexual Activity   Alcohol use: Yes   Drug use: Never   Sexual activity: Yes    Birth control/protection: Surgical  Other Topics Concern   Not on file  Social History Narrative   Not on file   Social Determinants of Health   Financial Resource Strain: Medium  Risk (05/07/2022)   Overall Financial Resource Strain (CARDIA)    Difficulty of Paying Living Expenses: Somewhat hard  Food Insecurity: No Food Insecurity (05/07/2022)   Hunger Vital Sign    Worried About Running Out of Food in the Last Year: Never true    Ran Out of Food in the Last Year: Never true  Transportation Needs: No Transportation Needs (05/07/2022)   PRAPARE - Hydrologist (Medical): No    Lack of Transportation (Non-Medical): No  Physical Activity: Not on file  Stress: Stress Concern Present (05/07/2022)   [place]    Feeling of Stress : Very much  Social Connections: Not on file     FAMILY HISTORY:  We obtained a detailed, 4-generation family history.  Significant diagnoses are listed below: Family History  Problem Relation Age of Onset   Melanoma Father 82 - 86       dx. again in his early 80s   Breast cancer Paternal Grandmother 18 - 77     Vanessa Murphy's father was diagnosed with melanoma in his late 11s and again in his early 105s. Her paternal grandmother was diagnosed with breast cancer in her 31s. Vanessa Murphy is unaware of previous family history of genetic testing for hereditary cancer risks. There is no reported Ashkenazi Jewish ancestry.   GENETIC COUNSELING ASSESSMENT: Vanessa Murphy is a 42 y.o. female with a personal and family history of cancer which is somewhat suggestive of a hereditary cancer syndrome and predisposition to cancer given her young age at diagnosis. We, therefore, discussed and recommended the following at today's visit.   DISCUSSION: We discussed that 5 - 10% of cancer is hereditary, with most cases of hereditary breast cancer associated with mutations in BRCA1/2.  There are other genes that can be associated with hereditary breast cancer syndromes. Type of cancer risk and level of risk are gene-specific. We discussed that testing is beneficial for several reasons including knowing how to follow individuals after completing their treatment, identifying whether potential treatment options would be beneficial, and understanding if other family members could be at risk for cancer and allowing them to undergo genetic testing.   We reviewed the characteristics, features and inheritance patterns of hereditary cancer syndromes. We also discussed genetic testing, including the appropriate family members to test, the process of testing, insurance coverage and turn-around-time for results. We discussed the implications of a negative, positive and/or variant of  uncertain significant result.   Vanessa Murphy elected to have [facility] Custom Panel. The Custom Hereditary Cancers Panel offered by [facility] includes sequencing and/or deletion duplication testing of the following 43 genes: APC, ATM, AXIN2, BAP1, BARD1, BMPR1A, BRCA1, BRCA2, BRIP1, CDH1, CDK4, CDKN2A (p14ARF and p16INK4a only), CHEK2, CTNNA1, EPCAM (Deletion/duplication testing only), FH, GREM1 (promoter region duplication testing only), HOXB13, KIT, MBD4, MEN1, MLH1, MSH2, MSH3, MSH6, MUTYH, NF1, NHTL1, PALB2, PDGFRA, PMS2, POLD1, POLE, PTEN, RAD51C, RAD51D, SMAD4, SMARCA4. STK11, TP53, TSC1, TSC2, and VHL.  Based on Vanessa Murphy personal and family history of cancer, she meets medical criteria for genetic testing. Despite that she meets criteria, she may still have an out of pocket cost. We discussed that if her out of pocket cost for testing is over $100, the laboratory should contact them to discuss self-pay prices, patient pay assistance programs, if applicable, and other billing options.   PLAN: After considering the risks, benefits, and limitations, Vanessa Murphy provided informed consent to pursue genetic testing  and the blood sample was sent to [facility] for analysis of the  Custom Cancer Panel. Results should be available within approximately 2-3 weeks' time, at which point they will be disclosed by telephone to Vanessa Murphy, as will any additional recommendations warranted by these results. Vanessa Murphy will receive a summary of her genetic counseling visit and a copy of her results once available. This information will also be available in Epic.   Vanessa Murphy questions were answered to her satisfaction today. Our contact information was provided should additional questions or concerns arise. Thank you for the referral and allowing [place] to share in the care of your patient.   Lucille Passy, MS, [facility] Genetic Counselor [email or web address] (P) [phone]  The  patient was seen for a total of 20 minutes in face-to-face genetic counseling.  The patient brought her partner, Ron. Drs. Lindi Adie and/or Burr Medico were available to discuss this case as needed.  _______________________________________________________________________ For Office Staff:  Number of people involved in session: 2 Was an Intern/ student involved with case: no

## 2022-05-07 NOTE — Progress Notes (Signed)
Vanessa Murphy  Initial Assessment   Vanessa Murphy is a 42 y.o. year old female accompanied by partner, Vanessa Murphy). Clinical Social Murphy was referred by  [facility]  for assessment of psychosocial needs.   SDOH (Social Determinants of Health) assessments performed: Yes SDOH Interventions    Flowsheet Row Clinical Support from 05/07/2022 in [place] at [employer] Interventions Intervention Not Indicated  Housing Interventions Intervention Not Indicated  Transportation Interventions Intervention Not Indicated  Financial Strain Interventions Other (Comment)  [cancer foundations]  Stress Interventions Provide Counseling, Other (Comment)  [support group, peer mentor]       SDOH Screenings   Food Insecurity: No Food Insecurity (05/07/2022)  Housing: Low Risk  (05/07/2022)  Transportation Needs: No Transportation Needs (05/07/2022)  Financial Resource Strain: Medium Risk (05/07/2022)  Stress: Stress Concern Present (05/07/2022)  Tobacco Use: Medium Risk (05/07/2022)     Distress Screen completed: Yes    05/07/2022    1:57 PM  ONCBCN DISTRESS SCREENING  Screening Type Initial Screening  Distress experienced in past week (1-10) 7  Emotional problem type Nervousness/Anxiety  Physical Problem type Nausea/vomiting;Sleep/insomnia      Family/Social Information:  Housing Arrangement: patient lives with significant other, Vanessa . 48yo daughter lives in [place] (to finish HS) with pt's parents Family members/support persons in your life? Significant other, his family. Limited support locally as moved to [place] from [place] 1.5 years ago Transportation concerns: no  Employment: Working full time as Psychologist, sport and exercise for [employer].  Income source: Employment - has FMLA and short-term disability Financial concerns: Yes, due to illness and/or loss of Murphy during treatment Type of concern: Medical bills Food access  concerns: no Religious or spiritual practice: Not known Services Currently in place:  [employer]. Working on bringing paperwork for [employer], STD if needed  Coping/ Adjustment to diagnosis: Patient understands treatment plan and what happens next? Patient understands but is overwhelmed by unexpected news of needing chemo and later daily radiation. Tearful during visit today Concerns about diagnosis and/or treatment: Losing my job and/or losing income and How I will pay for the services I need and keep supporting daughter financially Patient reported stressors: Finances, Anxiety/ nervousness, and Adjusting to my illness Current coping skills/ strengths: Other: supportive partner. Open to additional peer and professional supports    SUMMARY: Current SDOH Barriers:  Financial constraints related to loss of income during treatment Anxiety related to diagnosis  Clinical Social Murphy Clinical Goal(s):  Demonstrate a reduction in symptoms related to :Anxiety  Explore resources for unmet needs related to medical financial constraints  Interventions: Discussed common feeling and emotions when being diagnosed with cancer, and the importance of support during treatment Informed patient of the support team roles and support services at [facility] Provided CSW contact information and encouraged patient to call with any questions or concerns Referred patient to [place] guide/ peer mentor Emailed information about breast cancer foundations for financial support Added to support group mailing list   Follow Up Plan: CSW will follow-up with patient by phone in 1 week and plan to support pt during treatment. Pt will look at foundation information and notify CSW with questions Patient verbalizes understanding of plan: Yes    Javan Gonzaga E Terell Kincy, LCSW

## 2022-05-07 NOTE — Research (Signed)
Trial:  [employer] 2021-05 - Specimen Collection Study to Evaluate Biomarkers in Subjects with Cancer  Patient Vanessa Murphy was identified by Dr Chryl Heck as a potential candidate for the above listed study.  This Clinical Research Nurse met with Vincenzina Graw, [id number], on 05/07/22 in a manner and location that ensures patient privacy to discuss participation in the above listed research study.  Patient is Accompanied by support person .  A copy of the informed consent document with embedded HIPAA language was provided to the patient.  Patient reads, speaks, and understands [place].   Patient was provided with the business card of this Nurse and encouraged to contact the research team with any questions.  Approximately 15 minutes were spent with the patient reviewing the informed consent documents.  Patient was provided the option of taking informed consent documents home to review and was encouraged to review at their convenience with their support network, including other care providers. Patient took the consent documents home to review. Patient expressed interest in enrolling in study. Research team will follow appt schedule and coordinate consents & labs with future appointments. Confirmed phone number [phone].  Marjie Skiff Gabbi Whetstone, RN, BSN, [facility]  Her  Hers Clinical Research Nurse [facility] Direct Dial [phone]  Pager [phone] 05/07/2022 1:24 PM

## 2022-05-07 NOTE — Telephone Encounter (Signed)
Left patient a vm regarding upcoming appointments  

## 2022-05-07 NOTE — Progress Notes (Signed)
[place] NOTE  Patient Care Team: Everardo Beals, NP as PCP - General Benay Pike, MD as Consulting Physician (Hematology and Oncology) Kyung Rudd, MD as Consulting Physician (Radiation Oncology) Jovita Kussmaul, MD as Consulting Physician (General Surgery) Mauro Kaufmann, RN as Oncology Nurse Navigator Rockwell Germany, RN as Oncology Nurse Navigator  CHIEF COMPLAINTS/PURPOSE OF CONSULTATION:  Newly diagnosed breast cancer  HISTORY OF PRESENTING ILLNESS:  Vanessa Murphy 42 y.o. female is here because of recent diagnosis of right breast cancer  I reviewed her records extensively and collaborated the history with the patient.  SUMMARY OF ONCOLOGIC HISTORY: Oncology History  Malignant neoplasm of upper-inner quadrant of right breast in female, estrogen receptor positive ([place])  04/24/2022 Mammogram   Bilateral diagnostic mammogram given palpable mass showed suspicious mass in the 3:00 location of the right breast for which biopsy was recommended.   04/25/2022 Pathology Results   Right breast needle core biopsy at 3:00 showed high-grade invasive ductal carcinoma.  Prognostic showed ER 40% positive weak staining intensity, PR 0% negative, group 5 HER2 negative and Ki-67 of 80%.   05/06/2022 Initial Diagnosis   Malignant neoplasm of upper-inner quadrant of right breast in female, estrogen receptor positive ([place])   05/21/2022 - 05/21/2022 Chemotherapy   Patient is on Treatment Plan : BREAST Pembrolizumab (200) D1 + Carboplatin (5) D1 + Paclitaxel (80) D1,8,15 q21d X 4 cycles / Pembrolizumab (200) D1 + AC D1 q21d x 4 cycles     05/21/2022 -  Chemotherapy   Patient is on Treatment Plan : BREAST Pembrolizumab (200) D1 + Carboplatin (1.5) D1,8,15 + Paclitaxel (80) D1,8,15 q21d X 4 cycles / Pembrolizumab (200) D1 + AC D1 q21d x 4 cycles      This is a very pleasant 42 year old female patient with no significant past medical history referred to breast [place]  for additional recommendations given new diagnosis of right breast high-grade invasive carcinoma.  She arrived to the appointment today with her boyfriend.  She tells me that she first felt this lump in early February, no significant change since diagnosis.  She used to work as a Charity fundraiser for 5 years hence was familiar with some terminology from the pathology report.  She is a smoker, smokes about half pack a day now , however used to smoke about a pack a day for 5 years.  She drinks about 2-3 drinks every week.  She has a 11 year old daughter.  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  She denies any history of breast cancer in her first-degree relatives but she says her family history is not very clear.  She does mention her paternal grandmother who had breast cancer in her 60s.  Rest of the pertinent 10 point ROS reviewed and negative.  MEDICAL HISTORY:  Past Medical History:  Diagnosis Date   Migraines     SURGICAL HISTORY: Past Surgical History:  Procedure Laterality Date   BREAST BIOPSY Right 04/25/2022   [place] RT BREAST BX W LOC DEV 1ST LESION IMG BX SPEC US GUIDE 04/25/2022 [facility] MAMMOGRAPHY    SOCIAL HISTORY: Social History   Socioeconomic History   Marital status: Single    Spouse name: Not on file   Number of children: Not on file   Years of education: Not on file   Highest education level: Not on file  Occupational History   Not on file  Tobacco Use   Smoking status: Former    Packs/day: .5  Types: Cigarettes    Quit date: 04/29/2022    Years since quitting: 0.0   Smokeless tobacco: Never  Substance and Sexual Activity   Alcohol use: Yes   Drug use: Never   Sexual activity: Yes    Birth control/protection: Surgical  Other Topics Concern   Not on file  Social History Narrative   Not on file   Social Determinants of Health   Financial Resource Strain: Not on file  Food Insecurity: Not on file  Transportation Needs: Not on file   Physical Activity: Not on file  Stress: Not on file  Social Connections: Not on file  Intimate Partner Violence: Not on file    FAMILY HISTORY: Family History  Problem Relation Age of Onset   Skin cancer Father    Breast cancer Paternal Grandmother     ALLERGIES:  is allergic to contrast media [iodinated contrast media] and amitriptyline.  MEDICATIONS:  No current outpatient medications on file.   No current facility-administered medications for this visit.    REVIEW OF SYSTEMS:   Constitutional: Denies fevers, chills or abnormal night sweats Eyes: Denies blurriness of vision, double vision or watery eyes Ears, nose, mouth, throat, and face: Denies mucositis or sore throat Respiratory: Denies cough, dyspnea or wheezes Cardiovascular: Denies palpitation, chest discomfort or lower extremity swelling Gastrointestinal:  Denies nausea, heartburn or change in bowel habits Skin: Denies abnormal skin rashes Lymphatics: Denies new lymphadenopathy or easy bruising Neurological:Denies numbness, tingling or new weaknesses Behavioral/Psych: Mood is stable, no new changes  Breast: Low denies any palpable lumps or discharge All other systems were reviewed with the patient and are negative.  PHYSICAL EXAMINATION: ECOG PERFORMANCE STATUS: 0 - Asymptomatic  Vitals:   05/07/22 0854  BP: 139/82  Pulse: 77  Resp: 16  Temp: 97.9 F (36.6 C)  SpO2: 100%   Filed Weights   05/07/22 0854  Weight: 135 lb 14.4 oz (61.6 kg)    GENERAL:alert, no distress and comfortable SKIN: skin color, texture, turgor are normal, no rashes or significant lesions EYES: normal, conjunctiva are pink and non-injected, sclera clear OROPHARYNX:no exudate, no erythema and lips, buccal mucosa, and tongue normal  NECK: supple, thyroid normal size, non-tender, without nodularity LYMPH:  no palpable lymphadenopathy in the cervical, axillary  LUNGS: clear to auscultation and percussion with normal breathing  effort HEART: regular rate & rhythm and no murmurs and no lower extremity edema ABDOMEN:abdomen soft, non-tender and normal bowel sounds Musculoskeletal:no cyanosis of digits and no clubbing  PSYCH: alert & oriented x 3 with fluent speech NEURO: no focal motor/sensory deficits BREAST: Right breast inner lower quadrant mass, palpable about 3 cms. no regional adenopathy  LABORATORY DATA:  I have reviewed the data as listed Lab Results  Component Value Date   WBC 7.6 05/07/2022   HGB 14.3 05/07/2022   HCT 41.1 05/07/2022   MCV 90.9 05/07/2022   PLT 258 05/07/2022   Lab Results  Component Value Date   NA 137 05/07/2022   K 4.5 05/07/2022   CL 105 05/07/2022   CO2 26 05/07/2022    RADIOGRAPHIC STUDIES: I have personally reviewed the radiological reports and agreed with the findings in the report.  ASSESSMENT AND PLAN:  Malignant neoplasm of upper-inner quadrant of right breast in female, estrogen receptor positive ([place]) This is a very pleasant 42 year old female patient with newly diagnosed right breast high-grade invasive ductal carcinoma, ER +40% weak staining, PR 0% negative, HER2 negative, Ki-67 of 80% referred to breast [place] for additional  recommendations.  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  With tumor size larger than 2 cm, we have discussed about considering neoadjuvant chemotherapy based on keynote 522 study.  Other option would be to do neoadjuvant non anthracycline-based regimen since she has node-negative disease.  Given high-grade, high proliferation index and weak staining of estrogen receptor, I do believe this is likely to behave like a triple negative breast cancer.  Pathology and radiology counseling: Discussed with the patient, the details of pathology including the type of breast cancer,the clinical staging, the significance of ER, PR and HER-2/neu receptors and the implications for treatment.  After reviewing the pathology in detail, we proceeded to discuss the different treatment options between surgery, radiation, chemotherapy, antiestrogen therapies.   Recommendation  1. Neoadjuvant chemotherapy with  Taxol weekly 12 with carboplatin (weekly or every 3 weeks) and Keytruda every 3 weeks x4, followed by Adriamycin and Cytoxan with Keytruda every 3 weeks followed by Beryle Flock maintenance for 1 year ( Adjuvant 9 cycles) 2. Followed by breast conserving surgery with sentinel lymph node study 3. Followed by adjuvant radiation therapy 4.  Followed by adjuvant antiestrogen therapy.  We can consider repeating prognostics if there is residual cancer specimen to confirm the weak ER positivity.  If she is ER negative on the residual tumor, we will omit antiestrogen therapy.   Chemotherapy Counseling: I discussed the risks and benefits of chemotherapy including the risks of nausea/ vomiting, risk of infection from low WBC count, fatigue due to chemo or anemia, bruising or bleeding due to low platelets, mouth sores, loss/ change in taste and decreased appetite. Liver and kidney function will be monitored through out chemotherapy as abnormalities in liver and kidney function may be a side effect of treatment.  Peripheral neuropathy due to Taxol and cardiac dysfunction due to Adriamycin was discussed in detail. Risk of permanent bone marrow dysfunction due to chemo were also discussed.  We also discussed the immune mediated adverse effects related to [facility].   Plan: 1. Port placement to be done  2. Echocardiogram 3. Chemotherapy class 4. Breast MRI  Genetic counseling will also be arranged   Return to clinic in 2 weeks to start chemotherapy.  She is agreeable to all these recommendations.    Total time spent: 60 minutes including history, physical exam, review of records, counseling and coordination of care All questions were answered. The patient knows to call the clinic with any problems,  questions or concerns.    Benay Pike, MD 05/07/22

## 2022-05-09 ENCOUNTER — Encounter: Payer: Self-pay | Admitting: *Deleted

## 2022-05-09 ENCOUNTER — Telehealth: Payer: Self-pay | Admitting: *Deleted

## 2022-05-09 DIAGNOSIS — Z170 Estrogen receptor positive status [ER+]: Secondary | ICD-10-CM

## 2022-05-09 NOTE — Telephone Encounter (Signed)
Spoke with patient to follow up from [facility] 3/27 and assess navigation needs. Patient tells me she is going for a 2nd opinion at [facility] in [place] on 4/4 but will move forward with all her appts here including port insertion. Encourage her to call with any questions or concerns. Patient verbalized understanding.

## 2022-05-12 ENCOUNTER — Telehealth: Payer: Self-pay

## 2022-05-12 HISTORY — PX: BREAST BIOPSY: SHX20

## 2022-05-12 NOTE — Telephone Encounter (Signed)
[employer] 2021-05 - Specimen Collection Study to Evaluate Biomarkers in Subjects with Cancer   Spoke with Ms Hladky to follow up on our meeting at [facility] last week. She wishes to go ahead with study enrollment. Plan to sign consents after her chemo ed on Friday 05/16/22, and labs will be drawn at 1st chemo on Thursday 05/22/22.  Marjie Skiff Ahmira Boisselle, RN, BSN, [facility]  Her  Hers Clinical Research Nurse [facility] Direct Dial [phone]  Pager [phone] 05/12/2022 3:31 PM

## 2022-05-13 ENCOUNTER — Other Ambulatory Visit: Payer: Self-pay | Admitting: *Deleted

## 2022-05-13 ENCOUNTER — Other Ambulatory Visit: Payer: Self-pay

## 2022-05-14 ENCOUNTER — Telehealth: Payer: Self-pay | Admitting: Hematology and Oncology

## 2022-05-14 NOTE — Telephone Encounter (Signed)
Patient aware of upcoming appointment change 

## 2022-05-15 ENCOUNTER — Encounter: Payer: Self-pay | Admitting: Genetic Counselor

## 2022-05-15 ENCOUNTER — Telehealth: Payer: Self-pay | Admitting: Genetic Counselor

## 2022-05-15 ENCOUNTER — Encounter: Payer: Self-pay | Admitting: *Deleted

## 2022-05-15 DIAGNOSIS — Z1379 Encounter for other screening for genetic and chromosomal anomalies: Secondary | ICD-10-CM | POA: Insufficient documentation

## 2022-05-15 NOTE — Progress Notes (Signed)
Pharmacist Chemotherapy Monitoring - Initial Assessment    Anticipated start date: 05/22/22   The following has been reviewed per standard work regarding the patient's treatment regimen: The patient's diagnosis, treatment plan and drug doses, and organ/hematologic function Lab orders and baseline tests specific to treatment regimen  The treatment plan start date, drug sequencing, and pre-medications Prior authorization status  Patient's documented medication list, including drug-drug interaction screen and prescriptions for anti-emetics and supportive care specific to the treatment regimen The drug concentrations, fluid compatibility, administration routes, and timing of the medications to be used The patient's access for treatment and lifetime cumulative dose history, if applicable  The patient's medication allergies and previous infusion related reactions, if applicable   Changes made to treatment plan:  treatment plan date  Follow up needed:  Pending authorization for treatment    Judge Stall, [facility], 05/15/2022  4:22 PM

## 2022-05-15 NOTE — Telephone Encounter (Signed)
I contacted [name] to discuss her genetic testing results. No pathogenic variants were identified in the 43 genes analyzed. Detailed clinic note to follow.  The test report has been scanned into EPIC and is located under the Molecular Pathology section of the Results Review tab.  A portion of the result report is included below for reference.   Vanessa Passy, MS, [facility] Genetic Counselor [email or web address] (P) [phone]

## 2022-05-16 ENCOUNTER — Inpatient Hospital Stay: Payer: BC Managed Care – PPO | Attending: Hematology and Oncology | Admitting: Licensed Clinical Social Worker

## 2022-05-16 ENCOUNTER — Other Ambulatory Visit: Payer: BC Managed Care – PPO

## 2022-05-16 ENCOUNTER — Ambulatory Visit
Admission: RE | Admit: 2022-05-16 | Discharge: 2022-05-16 | Disposition: A | Discharge status: Home / Self Care | Payer: BC Managed Care – PPO | Source: Ambulatory Visit | Attending: Hematology and Oncology | Admitting: Hematology and Oncology

## 2022-05-16 DIAGNOSIS — Z5189 Encounter for other specified aftercare: Secondary | ICD-10-CM | POA: Insufficient documentation

## 2022-05-16 DIAGNOSIS — R509 Fever, unspecified: Secondary | ICD-10-CM | POA: Insufficient documentation

## 2022-05-16 DIAGNOSIS — Z79899 Other long term (current) drug therapy: Secondary | ICD-10-CM | POA: Insufficient documentation

## 2022-05-16 DIAGNOSIS — Z170 Estrogen receptor positive status [ER+]: Secondary | ICD-10-CM

## 2022-05-16 DIAGNOSIS — Z87891 Personal history of nicotine dependence: Secondary | ICD-10-CM | POA: Insufficient documentation

## 2022-05-16 DIAGNOSIS — Z5111 Encounter for antineoplastic chemotherapy: Secondary | ICD-10-CM | POA: Insufficient documentation

## 2022-05-16 DIAGNOSIS — R1030 Lower abdominal pain, unspecified: Secondary | ICD-10-CM | POA: Insufficient documentation

## 2022-05-16 DIAGNOSIS — Z808 Family history of malignant neoplasm of other organs or systems: Secondary | ICD-10-CM | POA: Insufficient documentation

## 2022-05-16 DIAGNOSIS — C50211 Malignant neoplasm of upper-inner quadrant of right female breast: Secondary | ICD-10-CM | POA: Insufficient documentation

## 2022-05-16 DIAGNOSIS — Z803 Family history of malignant neoplasm of breast: Secondary | ICD-10-CM | POA: Insufficient documentation

## 2022-05-16 MED ORDER — GADOPICLENOL 0.5 MMOL/ML IV SOLN
6.0000 mL | Freq: Once | INTRAVENOUS | Status: AC | PRN
  Administered 2022-05-16: 6 mL via INTRAVENOUS

## 2022-05-16 NOTE — Progress Notes (Signed)
[facility] CSW Progress Note  Clinical Child psychotherapist contacted patient by phone to follow-up on coping after [facility]. Pt reports feeling a little better since she had her second opinion at [facility] yesterday and received confirmation on plan of care.  She is working on [employer] and CSW provided contact for our [employer] team here.  Also reviewed potential avenues for financial assistance through cancer foundations.   CSW will plan to check in with patient during first chemo treatment.    Murad Staples E Bre Pecina, LCSW

## 2022-05-19 ENCOUNTER — Encounter: Payer: Self-pay | Admitting: *Deleted

## 2022-05-19 ENCOUNTER — Other Ambulatory Visit: Payer: Self-pay

## 2022-05-19 ENCOUNTER — Telehealth: Payer: Self-pay | Admitting: *Deleted

## 2022-05-19 ENCOUNTER — Other Ambulatory Visit: Payer: Self-pay | Admitting: Hematology and Oncology

## 2022-05-19 DIAGNOSIS — R928 Other abnormal and inconclusive findings on diagnostic imaging of breast: Secondary | ICD-10-CM

## 2022-05-19 DIAGNOSIS — C50211 Malignant neoplasm of upper-inner quadrant of right female breast: Secondary | ICD-10-CM

## 2022-05-19 NOTE — Telephone Encounter (Signed)
Called pt to discuss MRI results and need for MR bx to the left breast. Received verbal understanding Orders placed, msg sent to schedule

## 2022-05-20 ENCOUNTER — Encounter: Payer: Self-pay | Admitting: Hematology and Oncology

## 2022-05-20 ENCOUNTER — Inpatient Hospital Stay: Payer: BC Managed Care – PPO | Admitting: Pharmacist

## 2022-05-20 ENCOUNTER — Other Ambulatory Visit: Payer: Self-pay | Admitting: Radiology

## 2022-05-20 ENCOUNTER — Inpatient Hospital Stay: Payer: BC Managed Care – PPO

## 2022-05-20 DIAGNOSIS — Z803 Family history of malignant neoplasm of breast: Secondary | ICD-10-CM | POA: Diagnosis not present

## 2022-05-20 DIAGNOSIS — Z5111 Encounter for antineoplastic chemotherapy: Secondary | ICD-10-CM | POA: Diagnosis present

## 2022-05-20 DIAGNOSIS — Z87891 Personal history of nicotine dependence: Secondary | ICD-10-CM | POA: Diagnosis not present

## 2022-05-20 DIAGNOSIS — Z808 Family history of malignant neoplasm of other organs or systems: Secondary | ICD-10-CM | POA: Diagnosis not present

## 2022-05-20 DIAGNOSIS — R509 Fever, unspecified: Secondary | ICD-10-CM | POA: Diagnosis not present

## 2022-05-20 DIAGNOSIS — Z170 Estrogen receptor positive status [ER+]: Secondary | ICD-10-CM

## 2022-05-20 DIAGNOSIS — R1030 Lower abdominal pain, unspecified: Secondary | ICD-10-CM | POA: Diagnosis not present

## 2022-05-20 DIAGNOSIS — C50211 Malignant neoplasm of upper-inner quadrant of right female breast: Secondary | ICD-10-CM | POA: Diagnosis not present

## 2022-05-20 DIAGNOSIS — Z5189 Encounter for other specified aftercare: Secondary | ICD-10-CM | POA: Diagnosis not present

## 2022-05-20 DIAGNOSIS — Z79899 Other long term (current) drug therapy: Secondary | ICD-10-CM | POA: Diagnosis not present

## 2022-05-20 NOTE — Progress Notes (Signed)
[facility]       Telephone: [phone]?Fax: [phone]   Oncology Clinical Pharmacist Practitioner Initial Assessment  Vanessa Murphy is a 42 y.o. female with a diagnosis of breast cancer. They were contacted today via in-person visit.  Indication/Regimen Pembrolizumab (Keytruda), paclitaxel (Taxol), carboplatin (Paraplatin ) followed by pembrolizumab Rande Lawman), doxorubicin (Adriamycin), cyclophosphamide (Cytoxan) is being used appropriately for treatment of breast cancer by Dr. Rachel Moulds.      Wt Readings from Last 1 Encounters:  05/07/22 135 lb 14.4 oz (61.6 kg)    Estimated body surface area is 1.67 meters squared as calculated from the following:   Height as of 05/07/22: 5\' 4"  (1.626 m).   Weight as of 05/07/22: 135 lb 14.4 oz (61.6 kg).  The dosing regimen cycle is every 21 days x 4 cycles  Pembrolizumab (200 mg) on Day 1 Paclitaxel (80 mg/m2) on Days 1, 8, 15 Carboplatin (AUC 5) on Day 1 Filgrastim (300 mcg or 480 mcg based on weight)  on Days 16, 17, 18  Followed by the below regimen cycle every 21 days x 4 cycles  Pembrolizumab (200 mg) Day 1 Doxorubicin (60 mg/m2) Day 1 Cyclophosphamide (600 mg/m2) Day 1 Pegfilgrastim (6 mg) on Day 3  It is planned to continue until treatment plan completion or unacceptable toxicity. The tentative start date is: 05/22/22  Dose Modifications none   Allergies Allergies  Allergen Reactions   Contrast Media [Iodinated Contrast Media] Nausea And Vomiting    "Skin feeling hot"   Amitriptyline Other (See Comments)    Confusion, sleeplessness    Vitals = No vitals or labs were done today for this chemotherapy education visit     05/07/2022    8:54 AM 10/22/2021    3:17 PM 10/22/2021    3:16 PM  Oncology Vitals  Height 163 cm    Weight 61.644 kg    Weight (lbs) 135 lbs 14 oz    BMI 23.33 kg/m2   23.33 kg/m2    Temp 97.9 F (36.6 C)  98.5 F (36.9 C)  Pulse Rate 77  70  BP 139/82  153/85   Resp 16 18 18   SpO2 100 %  96 %  BSA (m2) 1.67 m2   1.67 m2       Laboratory Data    Latest Ref Rng & Units 05/07/2022    8:27 AM  CBC EXTENDED  WBC 4.0 - 10.5 K/uL 7.6   RBC 3.87 - 5.11 MIL/uL 4.52   Hemoglobin 12.0 - 15.0 g/dL 88.8   HCT 91.6 - 94.5 % 41.1   Platelets 150 - 400 K/uL 258   NEUT# 1.7 - 7.7 K/uL 5.2   Lymph# 0.7 - 4.0 K/uL 1.6        Latest Ref Rng & Units 05/07/2022    8:27 AM  CMP  Glucose 70 - 99 mg/dL 038   BUN 6 - 20 mg/dL 11   Creatinine 8.82 - 1.00 mg/dL 8.00   Sodium 349 - 179 mmol/L 137   Potassium 3.5 - 5.1 mmol/L 4.5   Chloride 98 - 111 mmol/L 105   CO2 22 - 32 mmol/L 26   Calcium 8.9 - 10.3 mg/dL 15.0   Total Protein 6.5 - 8.1 g/dL 7.5   Total Bilirubin 0.3 - 1.2 mg/dL 0.7   Alkaline Phos 38 - 126 U/L 50   AST 15 - 41 U/L 13   ALT 0 - 44 U/L 13    Contraindications Contraindications were  reviewed? Yes Contraindications to therapy were identified? No   Safety Precautions The following safety precautions were reviewed:  Fever: reviewed the importance of having a thermometer and the Centers for Disease Control and Prevention (CDC) definition of fever which is 100.79F (38C) or higher. Patient should call 24/7 triage at [phone] if experiencing a fever or any other symptoms Decreased white blood cells (WBCs) and increased risk for infection Decreased platelet count and increased risk of bleeding Decreased hemoglobin, part of the red blood cells that carry iron and oxygen Nausea or vomiting Diarrhea or constipation Hair Loss Fatigue Changes in liver function Rash Peripheral Neuropathy Hypersensitivity reactions Pembrolizumab toxicities (skin, lung, liver, thyroid, kidneys, vision Changes in color of urine Mouth sores MDS/AML Handling body fluids and waste  Medication Reconciliation Current Outpatient Medications  Medication Sig Dispense Refill   fluticasone (FLONASE) 50 MCG/ACT nasal spray Place 2 sprays into both nostrils  daily. OTC from [employer]     No current facility-administered medications for this visit.    Medication reconciliation is based on the patient's most recent medication list in the electronic medical record (EMR) including herbal products and OTC medications.   The patient's medication list was reviewed today with the patient? Yes   Drug-drug interactions (DDIs) DDIs were evaluated? Yes Significant DDIs identified? Yes   Drug-Food Interactions Drug-food interactions were evaluated? Yes Drug-food interactions identified? No   Follow-up Plan  Treatment start date: 05/22/22 Port placement date: 05/21/22 ECHO date: 05/21/22 We reviewed the prescriptions, premedications, and treatment regimen with the patient. Possible side effects of the treatment regimen were reviewed and management strategies were discussed.  Prescriptions not sent yet. Will let Dr. Al Pimple know Vanessa Murphy mentioned that [facility] where she had [facility] opinion had communicated with Dr. Al Pimple to possibly run pathology again on biopsy. Will let Dr. Al Pimple know Can use loperamide as needed for diarrhea, loratadine as needed for G-CSF bone pain, and Senna-S as needed for constipation.  Clinical pharmacy will assist Dr. Rachel Moulds and Vanessa Murphy on an as needed basis going forward  Vanessa Murphy participated in the discussion, expressed understanding, and voiced agreement with the above plan. All questions were answered to her satisfaction. The patient was advised to contact the clinic at [phone] with any questions or concerns prior to her return visit.   I spent 60 minutes assessing the patient.  Cloe Sockwell A. Odetta Pink, PharmD, BCOP, CPP  Anselm Lis, RPH-CPP, 05/20/2022 11:03 AM  **Disclaimer: This note was dictated with voice recognition software. Similar sounding words can inadvertently be transcribed and this note may contain transcription errors which may not have been corrected upon publication of  note.**

## 2022-05-20 NOTE — Progress Notes (Signed)
Received call from patient after receiving my card per my request.  Introduced myself as Dance movement psychotherapist and to offer available resources. Discussed one-time $1000 Marketing executive to assist with personal expenses while going through treatment. Advised what is needed to apply and she states she can send via fax. My fax information is on the card she received. Advised on 4/11 she will be given grant paperwork to complete and will be scanned to me. Advised at her earliest convenience after, she may give me a call to discuss grant expenses in detail and how to submit. She verbalized understanding.   She has my card to do so and  for any additional financial questions or concerns.  Also, asked about insurance ded/OOP. She states she has met her deductible but not OOP yet. Provided her the number to [facility]([phone]) should any copay assistance be needed for treatment. They work with [facility] to assist with copay for specific treatment drugs or diagnosis. She verbalized understanding.

## 2022-05-21 ENCOUNTER — Ambulatory Visit (HOSPITAL_COMMUNITY)
Admission: RE | Admit: 2022-05-21 | Discharge: 2022-05-21 | Disposition: A | Discharge status: Home / Self Care | Payer: BC Managed Care – PPO | Source: Ambulatory Visit | Attending: Hematology and Oncology | Admitting: Hematology and Oncology

## 2022-05-21 ENCOUNTER — Encounter (HOSPITAL_COMMUNITY): Payer: Self-pay

## 2022-05-21 ENCOUNTER — Other Ambulatory Visit: Payer: Self-pay | Admitting: Hematology and Oncology

## 2022-05-21 ENCOUNTER — Other Ambulatory Visit: Payer: BC Managed Care – PPO

## 2022-05-21 ENCOUNTER — Other Ambulatory Visit: Payer: Self-pay

## 2022-05-21 DIAGNOSIS — Z170 Estrogen receptor positive status [ER+]: Secondary | ICD-10-CM | POA: Insufficient documentation

## 2022-05-21 DIAGNOSIS — Z0189 Encounter for other specified special examinations: Secondary | ICD-10-CM

## 2022-05-21 DIAGNOSIS — C50211 Malignant neoplasm of upper-inner quadrant of right female breast: Secondary | ICD-10-CM

## 2022-05-21 HISTORY — PX: IR IMAGING GUIDED PORT INSERTION: IMG5740

## 2022-05-21 LAB — ECHOCARDIOGRAM COMPLETE
AV Mean grad: 7 mmHg
AV Peak grad: 12.7 mmHg
Ao pk vel: 1.78 m/s
Area-P 1/2: 4.06 cm2
Calc EF: 66.6 %
S' Lateral: 2.7 cm
Single Plane A2C EF: 64.3 %
Single Plane A4C EF: 65.1 %

## 2022-05-21 LAB — GLUCOSE, CAPILLARY: Glucose-Capillary: 74 mg/dL (ref 70–99)

## 2022-05-21 MED ORDER — FENTANYL CITRATE (PF) 100 MCG/2ML IJ SOLN
INTRAMUSCULAR | Status: AC
  Filled 2022-05-21: qty 2

## 2022-05-21 MED ORDER — LIDOCAINE-PRILOCAINE 2.5-2.5 % EX CREA
TOPICAL_CREAM | CUTANEOUS | 3 refills | Status: DC
Start: 2022-05-21 — End: 2022-09-25

## 2022-05-21 MED ORDER — DEXAMETHASONE 4 MG PO TABS
ORAL_TABLET | ORAL | 1 refills | Status: DC
Start: 2022-05-21 — End: 2022-09-04

## 2022-05-21 MED ORDER — PROCHLORPERAZINE MALEATE 10 MG PO TABS
10.0000 mg | ORAL_TABLET | Freq: Four times a day (QID) | ORAL | 1 refills | Status: DC | PRN
Start: 2022-05-21 — End: 2022-09-25

## 2022-05-21 MED ORDER — MIDAZOLAM HCL 2 MG/2ML IJ SOLN
INTRAMUSCULAR | Status: AC | PRN
  Administered 2022-05-21 (×4): 1 mg via INTRAVENOUS

## 2022-05-21 MED ORDER — LIDOCAINE HCL 1 % IJ SOLN
INTRAMUSCULAR | Status: AC
  Filled 2022-05-21: qty 20

## 2022-05-21 MED ORDER — FENTANYL CITRATE (PF) 100 MCG/2ML IJ SOLN
INTRAMUSCULAR | Status: AC | PRN
  Administered 2022-05-21 (×2): 50 ug via INTRAVENOUS

## 2022-05-21 MED ORDER — SODIUM CHLORIDE 0.9 % IV SOLN: INTRAVENOUS | Status: DC

## 2022-05-21 MED ORDER — HEPARIN SOD (PORK) LOCK FLUSH 100 UNIT/ML IV SOLN
500.0000 [IU] | Freq: Once | INTRAVENOUS | Status: AC
  Administered 2022-05-21: 500 [IU] via INTRAVENOUS

## 2022-05-21 MED ORDER — MIDAZOLAM HCL 2 MG/2ML IJ SOLN
INTRAMUSCULAR | Status: AC
  Filled 2022-05-21: qty 4

## 2022-05-21 MED ORDER — LIDOCAINE HCL 1 % IJ SOLN
20.0000 mL | Freq: Once | INTRAMUSCULAR | Status: AC
  Administered 2022-05-21: 16 mL via INTRADERMAL

## 2022-05-21 MED ORDER — HEPARIN SOD (PORK) LOCK FLUSH 100 UNIT/ML IV SOLN
INTRAVENOUS | Status: AC
  Filled 2022-05-21: qty 5

## 2022-05-21 MED ORDER — ONDANSETRON HCL 8 MG PO TABS
8.0000 mg | ORAL_TABLET | Freq: Three times a day (TID) | ORAL | 1 refills | Status: DC | PRN
Start: 2022-05-21 — End: 2022-09-25

## 2022-05-21 MED FILL — Dexamethasone Sodium Phosphate Inj 100 MG/10ML: INTRAMUSCULAR | Qty: 1 | Status: AC

## 2022-05-21 NOTE — Discharge Instructions (Signed)

## 2022-05-21 NOTE — Consult Note (Signed)
Chief Complaint: Patient was seen in consultation today for port a cath placement  Referring Physician(s): Iruku,Praveena  Supervising Physician: Irish Lack  Patient Status: [facility] - Out-pt  History of Present Illness: Vanessa Murphy is a 42 y.o. female smoker with hx recently diagnosed right breast cancer who presents today for port a cath placement to assist with treatment.      Past Medical History:  Diagnosis Date   Migraines     Past Surgical History:  Procedure Laterality Date   BREAST BIOPSY Right 04/25/2022   [place] RT BREAST BX W LOC DEV 1ST LESION IMG BX SPEC US GUIDE 04/25/2022 [facility]AMMOGRAP[facility]    Allergies: Contrast media [iodinated contrast media] and Amitriptyline  Medications: Prior to Admission medications   Medication Sig Start Date End Date Taking? Authorizing Provider  fluticasone (FLONASE) 50 MCG/ACT nasal spray Place 2 sprays into both nostrils daily. OTC from [employer]   Yes [provider]  dexamethasone (DECADRON) 4 MG tablet Take 2 tablets daily for 2 days, start the day after chemotherapy. Take with food. 05/21/22   Rachel Moulds, MD  lidocaine-prilocaine (EMLA) cream Apply to affected area once 05/21/22   Rachel Moulds, MD  ondansetron (ZOFRAN) 8 MG tablet Take 1 tablet (8 mg total) by mouth every 8 (eight) hours as needed for nausea or vomiting. Start on the third day after chemotherapy. 05/21/22   Rachel Moulds, MD  prochlorperazine (COMPAZINE) 10 MG tablet Take 1 tablet (10 mg total) by mouth every 6 (six) hours as needed for nausea or vomiting. 05/21/22   Rachel Moulds, MD     Family History  Problem Relation Age of Onset   Melanoma Father 19 - 62       dx. again in his early 80s   Breast cancer Paternal Grandmother 15 - 61    Social History   Socioeconomic History   Marital status: Single    Spouse name: Not on file   Number of children: Not on file   Years of education: Not on file   Highest education level:  Not on file  Occupational History   Not on file  Tobacco Use   Smoking status: Former    Packs/day: .5    Types: Cigarettes    Quit date: 04/29/2022    Years since quitting: 0.0   Smokeless tobacco: Never  Substance and Sexual Activity   Alcohol use: Yes   Drug use: Never   Sexual activity: Yes    Birth control/protection: Surgical    Comment: MRI 05-16-22 Left Breast Lump found  Other Topics Concern   Not on file  Social History Narrative   Not on file   Social Determinants of Health   Financial Resource Strain: Medium Risk (05/07/2022)   Overall Financial Resource Strain (CARDIA)    Difficulty of Paying Living Expenses: Somewhat hard  Food Insecurity: No Food Insecurity (05/07/2022)   Hunger Vital Sign    Worried About Running Out of Food in the Last Year: Never true    Ran Out of Food in the Last Year: Never true  Transportation Needs: No Transportation Needs (05/07/2022)   PRAPARE - Administrator, Civil Service (Medical): No    Lack of Transportation (Non-Medical): No  Physical Activity: Not on file  Stress: Stress Concern Present (05/07/2022)   [employer] of Occupational Health - Occupational Stress Questionnaire    Feeling of Stress : Very much  Social Connections: Not on file      Review of  Systems denies fever,CP,dyspnea, cough, abd/back pain,vomiting or bleeding; does have occ nausea/HA  Vital Signs: BP 118/86   Pulse 74   Temp 98.3 F (36.8 C) (Oral)   Resp 16   Ht 5\' 4"  (1.626 m)   Wt 137 lb (62.1 kg)   LMP 05/11/2022 (Approximate) Comment: 05-11-22 to 05-15-22, Fallopian tubes have been removed  SpO2 100%   BMI 23.52 kg/m   Code Status: FULL CODE  Physical Exam: awake/alert; chest- CTA bilat; heart- RRR; abd- soft,+BS,NT; no LE edema  Imaging: ECHOCARDIOGRAM COMPLETE  Result Date: 05/21/2022    ECHOCARDIOGRAM REPORT   Patient Name:   Vanessa Murphy Date of Exam: 05/21/2022 Medical Rec #:  [id number]          Height:       64.0  in Accession #:    [id number]         Weight:       135.9 lb Date of Birth:  [birth year]           BSA:          1.660 m Patient Age:    42 years           BP:           139/82 mmHg Patient Gender: F                  HR:           60 bpm. Exam Location:  Outpatient Procedure: 2D Echo, Color Doppler, Cardiac Doppler and Strain Analysis Indications:    Chemo  History:        Patient has no prior history of Echocardiogram examinations.                 Breast cancer.  Sonographer:    Milbert Coulter Referring Phys: [id number] PRAVEENA IRUKU  Sonographer Comments: Global longitudinal strain was attempted. IMPRESSIONS  1. Left ventricular ejection fraction, by estimation, is 60 to 65%. The left ventricle has normal function. The left ventricle has no regional wall motion abnormalities. Left ventricular diastolic parameters were normal.  2. Right ventricular systolic function is normal. The right ventricular size is normal.  3. The mitral valve is normal in structure. No evidence of mitral valve regurgitation. No evidence of mitral stenosis.  4. Although not clearly seen, the aortic valve appears to be bicuspid (Sievers 1, fusion of right and noncoronary cusps).. The aortic valve is abnormal. Aortic valve regurgitation is not visualized. No aortic stenosis is present.  5. The inferior vena cava is normal in size with greater than 50% respiratory variability, suggesting right atrial pressure of 3 mmHg. FINDINGS  Left Ventricle: Left ventricular ejection fraction, by estimation, is 60 to 65%. The left ventricle has normal function. The left ventricle has no regional wall motion abnormalities. Global longitudinal strain performed but not reported based on interpreter judgement due to suboptimal tracking. The left ventricular internal cavity size was normal in size. There is no left ventricular hypertrophy. Left ventricular diastolic parameters were normal. Right Ventricle: The right ventricular size is normal. No increase in right  ventricular wall thickness. Right ventricular systolic function is normal. Left Atrium: Left atrial size was normal in size. Right Atrium: Right atrial size was normal in size. Pericardium: There is no evidence of pericardial effusion. Mitral Valve: The mitral valve is normal in structure. No evidence of mitral valve regurgitation. No evidence of mitral valve stenosis. Tricuspid Valve: The tricuspid valve is normal in structure. Tricuspid valve regurgitation  is not demonstrated. No evidence of tricuspid stenosis. Aortic Valve: Although not clearly seen, the aortic valve appears to be bicuspid (Sievers 1, fusion of right and noncoronary cusps). The aortic valve is abnormal. Aortic valve regurgitation is not visualized. No aortic stenosis is present. Aortic valve mean gradient measures 7.0 mmHg. Aortic valve peak gradient measures 12.7 mmHg. Pulmonic Valve: The pulmonic valve was normal in structure. Pulmonic valve regurgitation is not visualized. No evidence of pulmonic stenosis. Aorta: No coarctation is seen. The aortic root is normal in size and structure. Venous: The inferior vena cava is normal in size with greater than 50% respiratory variability, suggesting right atrial pressure of 3 mmHg. IAS/Shunts: No atrial level shunt detected by color flow Doppler.  LEFT VENTRICLE PLAX 2D LVIDd:         4.30 cm     Diastology LVIDs:         2.70 cm     LV e' medial:    10.80 cm/s LV PW:         0.90 cm     LV E/e' medial:  8.9 LV IVS:        0.90 cm     LV e' lateral:   17.60 cm/s LVOT diam:     2.20 cm     LV E/e' lateral: 5.5 LVOT Area:     3.80 cm  LV Volumes (MOD) LV vol d, MOD A2C: 64.4 ml LV vol d, MOD A4C: 52.2 ml LV vol s, MOD A2C: 23.0 ml LV vol s, MOD A4C: 18.2 ml LV SV MOD A2C:     41.4 ml LV SV MOD A4C:     52.2 ml LV SV MOD BP:      40.4 ml RIGHT VENTRICLE RV Basal diam:  3.00 cm RV Mid diam:    2.80 cm RV S prime:     10.90 cm/s TAPSE (M-mode): 2.5 cm LEFT ATRIUM             Index        RIGHT ATRIUM            Index LA diam:        3.00 cm 1.81 cm/m   RA Area:     13.60 cm LA Vol (A2C):   24.8 ml 14.94 ml/m  RA Volume:   32.90 ml  19.82 ml/m LA Vol (A4C):   22.4 ml 13.49 ml/m LA Biplane Vol: 25.7 ml 15.48 ml/m  AORTIC VALVE AV Vmax:      178.00 cm/s AV Vmean:     120.000 cm/s AV VTI:       0.376 m AV Peak Grad: 12.7 mmHg AV Mean Grad: 7.0 mmHg  AORTA Ao Root diam: 2.70 cm Ao Asc diam:  3.30 cm MITRAL VALVE MV Area (PHT): 4.06 cm    SHUNTS MV Decel Time: 187 msec    Systemic Diam: 2.20 cm MV E velocity: 96.00 cm/s MV A velocity: 66.40 cm/s MV E/A ratio:  1.45 Mihai Croitoru MD Electronically signed by Thurmon Fair MD Signature Date/Time: 05/21/2022/2:04:02 PM    Final    MR BREAST BILATERAL W WO CONTRAST INC CAD  Result Date: 05/16/2022 CLINICAL DATA:  Recent diagnosis of invasive right breast carcinoma. Assess extent of disease. EXAM: BILATERAL BREAST MRI WITH AND WITHOUT CONTRAST TECHNIQUE: Multiplanar, multisequence MR images of both breasts were obtained prior to and following the intravenous administration of 6 ml of Vueway. Three-dimensional MR images were rendered by post-processing of the original MR  data on an independent workstation. The three-dimensional MR images were interpreted, and findings are reported in the following complete MRI report for this study. Three dimensional images were evaluated at the independent interpreting workstation using the DynaCAD thin client. COMPARISON:  Prior exams FINDINGS: Breast composition: b. Scattered fibroglandular tissue. Background parenchymal enhancement: Mild Right breast: Heterogeneously enhancing mass the in the lower inner right breast at approximately 3:30 o'clock, measuring 2.6 x 2.2 x 2.6 cm, containing a small focus of post biopsy clip susceptibility artifact, consistent with the recently diagnosed invasive right breast carcinoma. No other right breast masses or areas of abnormal enhancement. Left breast: Small enhancing mass, anterior, lower inner  quadrant, measuring 10 x 4 x 5 mm, demonstrating some washout kinetics, but also demonstrating increased T2 signal. No other left breast masses or areas of abnormal enhancement. Lymph nodes: No abnormal appearing lymph nodes. Ancillary findings:  None. IMPRESSION: 1. 1 cm indeterminate enhancing mass in the anterior left breast, lower inner quadrant, best appreciated on image 98, series 6. Tissue sampling is recommended. 2. 2.6 cm recently biopsied right breast carcinoma. No evidence of additional right breast carcinoma. 3. No abnormal lymph nodes. RECOMMENDATION: 1. MRI guided core needle biopsy of the small enhancing left breast mass. BI-RADS CATEGORY  4: Suspicious. Electronically Signed   By: Amie Portland M.D.   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 This was found to be concordant by Dr. Annia Belt. Pathology results were discussed with the patient by telephone. The patient reported doing well after the biopsy with tenderness at the site. Post biopsy instructions and care were reviewed and questions were answered. The patient was encouraged to call [facility] for any additional concerns. The patient was referred to [facility] on May 07, 2022, per patient request. Pathology results reported by Collene Mares RN on 04/28/2022. Electronically Signed   By: Annia Belt M.D.   On: 04/29/2022 15:47   Result Date: 04/29/2022 CLINICAL DATA:  Suspicious palpable right breast mass. EXAM: ULTRASOUND GUIDED RIGHT BREAST CORE NEEDLE BIOPSY COMPARISON:  Previous exam(s). PROCEDURE: I met with the patient and we discussed the procedure of ultrasound-guided biopsy, including benefits and  alternatives. We discussed the high likelihood of a successful procedure. We discussed the risks of the procedure, including infection, bleeding, tissue injury, clip migration, and inadequate sampling. Informed written consent was given. The usual time-out protocol was performed immediately prior to the procedure. Lesion quadrant: Lower inner quadrant Using sterile technique and 1% Lidocaine as local anesthetic, under direct ultrasound visualization, a 14 gauge spring-loaded device was used to perform biopsy of right breast mass 3 o'clock position using a medial approach. At the conclusion of the procedure heart shaped tissue marker clip was deployed into the biopsy cavity. Follow up 2 view mammogram was performed and dictated separately. IMPRESSION: Ultrasound guided biopsy of right breast mass 3 o'clock position. No apparent complications. Electronically Signed: By: Annia Belt M.D. On: 04/25/2022 14:28  MM CLIP PLACEMENT RIGHT  Result Date: 04/25/2022 CLINICAL DATA:  Suspicious palpable right breast mass. EXAM: 3D DIAGNOSTIC RIGHT MAMMOGRAM POST ULTRASOUND BIOPSY COMPARISON:  Previous exam(s). FINDINGS: 3D Mammographic images were obtained following ultrasound guided biopsy of palpable  right breast mass. The biopsy marking clip is in expected position at the site of biopsy. IMPRESSION: Appropriate positioning of the heart shaped biopsy marking clip at the site of biopsy in the right breast mass 3 o'clock position. Final Assessment: Post Procedure Mammograms for Marker Placement Electronically Signed   By: Annia Beltrew  Davis M.D.   On: 04/25/2022 14:30  MM DIAG BREAST TOMO BILATERAL  Result Date: 04/24/2022 CLINICAL DATA:  Two palpable abnormalities in the RIGHT breast. EXAM: DIGITAL DIAGNOSTIC BILATERAL MAMMOGRAM WITH TOMOSYNTHESIS; ULTRASOUND RIGHT BREAST LIMITED TECHNIQUE: Bilateral digital diagnostic mammography and breast tomosynthesis was performed.; Targeted ultrasound examination of the right breast was  performed COMPARISON:  Baseline ACR Breast Density Category b: There are scattered areas of fibroglandular density. FINDINGS: RIGHT BREAST: Mammogram: Within the MEDIAL aspect of the RIGHT breast, there is an oval mass with spiculated margins correlating with the palpable finding. Spot tangential view is performed in the UPPER central RIGHT breast corresponding to the second area of concern. This view shows normal appearing fibrofatty tissue. Mammographic images were processed with CAD. Physical Exam: I palpate a discrete mobile mass in the 3 o'clock location of the RIGHT breast 4 centimeters from the nipple. In the UPPER central RIGHT breast, I palpate superficial ribs but no discrete mass. Ultrasound: Targeted ultrasound is performed, showing an oval mass with irregular margins in the 3 o'clock location of the RIGHT breast 4 centimeters from the nipple. Mass is taller than wide without significant blood flow on Doppler evaluation. Mass measures 2.2 x 1.9 x 2.0 centimeters. A small contiguous nodule measures 3 millimeters and is suspicious for satellite lesion. Evaluation of the RIGHT axilla is negative for adenopathy. LEFT BREAST: Mammogram: No suspicious mass, distortion, or microcalcifications are identified to suggest presence of malignancy. Mammographic images were processed with CAD. IMPRESSION: Suspicious mass in the 3 o'clock location of the RIGHT breast for which biopsy is recommended. RECOMMENDATION: Ultrasound-guided core biopsy of RIGHT breast mass. I have discussed the findings and recommendations with the patient. If applicable, a reminder letter will be sent to the patient regarding the next appointment. BI-RADS CATEGORY  5: Highly suggestive of malignancy. Electronically Signed   By: Norva PavlovElizabeth  Brown M.D.   On: 04/24/2022 12:54  US BREAST LTD UNI RIGHT INC AXILLA  Result Date: 04/24/2022 CLINICAL DATA:  Two palpable abnormalities in the RIGHT breast. EXAM: DIGITAL DIAGNOSTIC BILATERAL MAMMOGRAM  WITH TOMOSYNTHESIS; ULTRASOUND RIGHT BREAST LIMITED TECHNIQUE: Bilateral digital diagnostic mammography and breast tomosynthesis was performed.; Targeted ultrasound examination of the right breast was performed COMPARISON:  Baseline ACR Breast Density Category b: There are scattered areas of fibroglandular density. FINDINGS: RIGHT BREAST: Mammogram: Within the MEDIAL aspect of the RIGHT breast, there is an oval mass with spiculated margins correlating with the palpable finding. Spot tangential view is performed in the UPPER central RIGHT breast corresponding to the second area of concern. This view shows normal appearing fibrofatty tissue. Mammographic images were processed with CAD. Physical Exam: I palpate a discrete mobile mass in the 3 o'clock location of the RIGHT breast 4 centimeters from the nipple. In the UPPER central RIGHT breast, I palpate superficial ribs but no discrete mass. Ultrasound: Targeted ultrasound is performed, showing an oval mass with irregular margins in the 3 o'clock location of the RIGHT breast 4 centimeters from the nipple. Mass is taller than wide without significant blood flow on Doppler evaluation. Mass measures 2.2 x 1.9 x 2.0 centimeters. A small contiguous nodule measures 3 millimeters and is suspicious for satellite lesion.  Evaluation of the RIGHT axilla is negative for adenopathy. LEFT BREAST: Mammogram: No suspicious mass, distortion, or microcalcifications are identified to suggest presence of malignancy. Mammographic images were processed with CAD. IMPRESSION: Suspicious mass in the 3 o'clock location of the RIGHT breast for which biopsy is recommended. RECOMMENDATION: Ultrasound-guided core biopsy of RIGHT breast mass. I have discussed the findings and recommendations with the patient. If applicable, a reminder letter will be sent to the patient regarding the next appointment. BI-RADS CATEGORY  5: Highly suggestive of malignancy. Electronically Signed   By: Norva Pavlov  M.D.   On: 04/24/2022 12:54   Labs:  CBC: Recent Labs    05/07/22 0827  WBC 7.6  HGB 14.3  HCT 41.1  PLT 258    COAGS: No results for input(s): "INR", "APTT" in the last 8760 hours.  BMP: Recent Labs    05/07/22 0827  NA 137  K 4.5  CL 105  CO2 26  GLUCOSE 101*  BUN 11  CALCIUM 10.1  CREATININE 0.72  GFRNONAA >60    LIVER FUNCTION TESTS: Recent Labs    05/07/22 0827  BILITOT 0.7  AST 13*  ALT 13  ALKPHOS 50  PROT 7.5  ALBUMIN 4.8    TUMOR MARKERS: No results for input(s): "AFPTM", "CEA", "CA199", "CHROMGRNA" in the last 8760 hours.  Assessment and Plan: 42 y.o. female smoker with hx recently diagnosed right breast cancer who presents today for port a cath placement to assist with treatment.Risks and benefits of image guided port-a-catheter placement was discussed with the patient including, but not limited to bleeding, infection, pneumothorax, or fibrin sheath development and need for additional procedures.  All of the patient's questions were answered, patient is agreeable to proceed. Consent signed and in chart.    Thank you for this interesting consult.  I greatly enjoyed meeting Vanessa Murphy and look forward to participating in their care.  A copy of this report was sent to the requesting provider on this date.  Electronically Signed: D. Jeananne Rama, PA-C 05/21/2022, 3:20 PM   I spent a total of 20 minutes  in face to face in clinical consultation, greater than 50% of which was counseling/coordinating care for port a cath placement

## 2022-05-21 NOTE — Procedures (Signed)
Interventional Radiology Procedure Note  Procedure: Single Lumen Power Port Placement    Access:  Left IJ vein.  Findings: Catheter tip positioned at SVC/RA junction. Port is ready for immediate use.   Complications: None  EBL: < 10 mL  Recommendations:  - Ok to shower in 24 hours - Do not submerge for 7 days - Routine line care   Semone Orlov T. Bassel Gaskill, M.D Pager:  [phone]   

## 2022-05-21 NOTE — Progress Notes (Signed)
Premeds released. 

## 2022-05-22 ENCOUNTER — Inpatient Hospital Stay: Payer: BC Managed Care – PPO | Admitting: Licensed Clinical Social Worker

## 2022-05-22 ENCOUNTER — Other Ambulatory Visit: Payer: BC Managed Care – PPO

## 2022-05-22 ENCOUNTER — Encounter: Payer: Self-pay | Admitting: Hematology and Oncology

## 2022-05-22 ENCOUNTER — Inpatient Hospital Stay: Payer: BC Managed Care – PPO

## 2022-05-22 ENCOUNTER — Inpatient Hospital Stay (HOSPITAL_BASED_OUTPATIENT_CLINIC_OR_DEPARTMENT_OTHER): Payer: BC Managed Care – PPO | Admitting: Hematology and Oncology

## 2022-05-22 VITALS — BP 112/77 | HR 65 | Resp 18

## 2022-05-22 DIAGNOSIS — Z170 Estrogen receptor positive status [ER+]: Secondary | ICD-10-CM

## 2022-05-22 DIAGNOSIS — C50211 Malignant neoplasm of upper-inner quadrant of right female breast: Secondary | ICD-10-CM | POA: Diagnosis not present

## 2022-05-22 DIAGNOSIS — Z95828 Presence of other vascular implants and grafts: Secondary | ICD-10-CM

## 2022-05-22 HISTORY — DX: Presence of other vascular implants and grafts: Z95.828

## 2022-05-22 LAB — CMP (CANCER CENTER ONLY)
ALT: 11 U/L (ref 0–44)
AST: 13 U/L — ABNORMAL LOW (ref 15–41)
Albumin: 4.2 g/dL (ref 3.5–5.0)
Alkaline Phosphatase: 48 U/L (ref 38–126)
Anion gap: 5 (ref 5–15)
BUN: 8 mg/dL (ref 6–20)
CO2: 26 mmol/L (ref 22–32)
Calcium: 9.2 mg/dL (ref 8.9–10.3)
Chloride: 105 mmol/L (ref 98–111)
Creatinine: 0.79 mg/dL (ref 0.44–1.00)
GFR, Estimated: >60 mL/min (ref >60)
Glucose, Bld: 90 mg/dL (ref 70–99)
Potassium: 3.9 mmol/L (ref 3.5–5.1)
Sodium: 136 mmol/L (ref 135–145)
Total Bilirubin: 0.4 mg/dL (ref 0.3–1.2)
Total Protein: 6.5 g/dL (ref 6.5–8.1)

## 2022-05-22 LAB — TSH: TSH: 1.025 u[IU]/mL (ref 0.350–4.500)

## 2022-05-22 LAB — CBC WITH DIFFERENTIAL (CANCER CENTER ONLY)
Abs Immature Granulocytes: 0.02 10*3/uL (ref 0.00–0.07)
Basophils Absolute: 0 10*3/uL (ref 0.0–0.1)
Basophils Relative: 0 %
Eosinophils Absolute: 0.1 10*3/uL (ref 0.0–0.5)
Eosinophils Relative: 1 %
HCT: 37.1 % (ref 36.0–46.0)
Hemoglobin: 12.7 g/dL (ref 12.0–15.0)
Immature Granulocytes: 0 %
Lymphocytes Relative: 22 %
Lymphs Abs: 1.7 10*3/uL (ref 0.7–4.0)
MCH: 31.4 pg (ref 26.0–34.0)
MCHC: 34.2 g/dL (ref 30.0–36.0)
MCV: 91.6 fL (ref 80.0–100.0)
Monocytes Absolute: 0.6 10*3/uL (ref 0.1–1.0)
Monocytes Relative: 8 %
Neutro Abs: 5.2 10*3/uL (ref 1.7–7.7)
Neutrophils Relative %: 69 %
Platelet Count: 228 10*3/uL (ref 150–400)
RBC: 4.05 MIL/uL (ref 3.87–5.11)
RDW: 12.8 % (ref 11.5–15.5)
WBC Count: 7.6 10*3/uL (ref 4.0–10.5)
nRBC: 0 % (ref 0.0–0.2)

## 2022-05-22 LAB — RESEARCH LABS

## 2022-05-22 LAB — PREGNANCY, URINE: Preg Test, Ur: NEGATIVE

## 2022-05-22 MED ORDER — SODIUM CHLORIDE 0.9 % IV SOLN
80.0000 mg/m2 | Freq: Once | INTRAVENOUS | Status: AC
  Administered 2022-05-22: 132 mg via INTRAVENOUS
  Filled 2022-05-22: qty 22

## 2022-05-22 MED ORDER — SODIUM CHLORIDE 0.9% FLUSH
10.0000 mL | INTRAVENOUS | Status: DC | PRN
  Administered 2022-05-22: 10 mL

## 2022-05-22 MED ORDER — SODIUM CHLORIDE 0.9% FLUSH
10.0000 mL | Freq: Once | INTRAVENOUS | Status: AC
  Administered 2022-05-22: 10 mL

## 2022-05-22 MED ORDER — SODIUM CHLORIDE 0.9 % IV SOLN: Freq: Once | INTRAVENOUS | Status: AC

## 2022-05-22 MED ORDER — DIPHENHYDRAMINE HCL 50 MG/ML IJ SOLN
50.0000 mg | Freq: Once | INTRAMUSCULAR | Status: AC
  Administered 2022-05-22: 50 mg via INTRAVENOUS
  Filled 2022-05-22: qty 1

## 2022-05-22 MED ORDER — PALONOSETRON HCL INJECTION 0.25 MG/5ML
0.2500 mg | Freq: Once | INTRAVENOUS | Status: AC
  Administered 2022-05-22: 0.25 mg via INTRAVENOUS
  Filled 2022-05-22: qty 5

## 2022-05-22 MED ORDER — HEPARIN SOD (PORK) LOCK FLUSH 100 UNIT/ML IV SOLN
500.0000 [IU] | Freq: Once | INTRAVENOUS | Status: AC | PRN
  Administered 2022-05-22: 500 [IU]

## 2022-05-22 MED ORDER — SODIUM CHLORIDE 0.9 % IV SOLN
200.0000 mg | Freq: Once | INTRAVENOUS | Status: AC
  Administered 2022-05-22: 200 mg via INTRAVENOUS
  Filled 2022-05-22: qty 200

## 2022-05-22 MED ORDER — SODIUM CHLORIDE 0.9 % IV SOLN
169.8000 mg | Freq: Once | INTRAVENOUS | Status: AC
  Administered 2022-05-22: 170 mg via INTRAVENOUS
  Filled 2022-05-22: qty 17

## 2022-05-22 MED ORDER — SODIUM CHLORIDE 0.9 % IV SOLN
10.0000 mg | Freq: Once | INTRAVENOUS | Status: AC
  Administered 2022-05-22: 10 mg via INTRAVENOUS
  Filled 2022-05-22: qty 10

## 2022-05-22 MED ORDER — FAMOTIDINE IN NACL 20-0.9 MG/50ML-% IV SOLN
20.0000 mg | Freq: Once | INTRAVENOUS | Status: AC
  Administered 2022-05-22: 20 mg via INTRAVENOUS
  Filled 2022-05-22: qty 50

## 2022-05-22 NOTE — Research (Addendum)
[employer] 2021-05 - Specimen Collection Study to Evaluate Biomarkers in Subjects with Cancer    This Nurse has reviewed this patient's inclusion and exclusion criteria as a second review and confirms Lakeya Henne is eligible for study participation.  Patient may continue with enrollment.   Janan Ridge RN, BSN, CCRP Clinical Research Nurse Lead 05/22/2022 11:43 AM

## 2022-05-22 NOTE — Research (Cosign Needed Addendum)
Trial Name:  [employer] 2021-05 - Specimen Collection Study to Evaluate Biomarkers in Subjects with Cancer    Patient Vanessa Murphy was identified by Dr. Al Pimple as a potential candidate for the above listed study.  This Clinical Research Nurse met with Vanessa Murphy, [id number] on 05/22/22 in a manner and location that ensures patient privacy to discuss participation in the above listed research study.  Patient is Unaccompanied.  Patient was previously provided with informed consent documents.  Patient has not yet read the informed consent documents and so documents were reviewed page by page today.  As outlined in the informed consent form, this Nurse and Vanessa Murphy discussed the purpose of the research study, the investigational nature of the study, study procedures and requirements for study participation, potential risks and benefits of study participation, as well as alternatives to participation.  This study is not blinded or double-blinded. The patient understands participation is voluntary and they may withdraw from study participation at any time.  This study does not involve randomization.  This study does not involve an investigational drug or device. This study does not involve a placebo. Patient understands enrollment is pending full eligibility review.   Confidentiality and how the patient's information will be used as part of study participation were discussed.  Patient was informed there is reimbursement provided for their time and effort spent on trial participation.  The patient is encouraged to discuss research study participation with their insurance provider to determine what costs they may incur as part of study participation, including research related injury.    All questions were answered to patient's satisfaction.  The informed consent with embedded HIPAA language was reviewed page by page.  The patient's mental and emotional status is appropriate to provide  informed consent, and the patient verbalizes an understanding of study participation.  Patient has agreed to participate in the above listed research study and has voluntarily signed the informed consent version IRB approved 24 Feb 2020, revised 11 Mar 2021 with embedded HIPAA.on 05/22/22 at 10:55 AM.  The patient was provided with a copy of the signed informed consent form with embedded HIPAA language for their reference.  No study specific procedures were obtained prior to the signing of the informed consent document.  Approximately 20 minutes were spent with the patient reviewing the informed consent documents.  Patient was not requested to complete a Release of Information form.   Data Collection:  Medical History:  High Blood Pressure  No Coronary Artery Disease No Lupus    No Rheumatoid Arthritis  No Diabetes   No          Lynch Syndrome  No  Is the patient currently taking a magnesium supplement?   No  Does the patient have a personal history of cancer (greater than 5 years ago)?  No  Does the patient have a family history of cancer in 1st or 2nd degree relatives? Yes If yes, Relationship(s) and Cancer type(s)? Father had skin cancer (Melanoma) and Grandmother had breast cancer.   Does the patient have history of alcohol consumption? Yes   If yes, current or former? Current If former, year stopped? n/a Number of years? 20 Drinks per week? 2  Does the patient have history of cigarette, cigar, pipe, or chewing tobacco use?  Yes  If yes, current for former? Current If yes, type (Cigarette, cigar, pipe, and/or chewing tobacco)? Cigarette   If former, year stopped? n/a Number of years? 15 Packs/number/containers per day? 1/2   Eligibility:  Eligibility criteria reviewed with patient. This Nurse has reviewed this patient's inclusion and exclusion criteria and confirmed patient is eligible for study participation. Eligibility confirmed by treating investigator, who also agrees that  patient should proceed with enrollment. Patient will continue with enrollment.  Blood Collection: Research blood obtained by [facility] a Cath per patient's preference. Patient tolerated well without any adverse events.  Gift Card: $50 gift card given to patient for her participation in this study.    Domenica Reamer, BSN, RN, [employer] Research Nurse II [phone] 05/22/2022

## 2022-05-22 NOTE — Assessment & Plan Note (Signed)
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  With tumor size larger than 2 cm, we have discussed about considering neoadjuvant chemotherapy based on keynote 522 study.  Other option would be to do neoadjuvant non anthracycline-based regimen since she has node-negative disease.  Given high-grade, high proliferation index and weak staining of estrogen receptor, I do believe this is likely to behave like a triple negative breast cancer. 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  We did discuss that if she is uncomfortable with the option to treated like a triple negative breast cancer we can certainly try to omit immunotherapy and adjust chemotherapy regimen.  She however wanted to proceed with treatment as planned today.  She wants to defer another biopsy, she is worried that the cancer is already growing.  She will proceed with chemotherapy as planned today, left breast biopsy tomorrow and return to clinic before her next cycle of chemotherapy.  Thank you for consulting [place] in the care of this patient.  Please do not hesitate to contact us with any additional questions or concerns.

## 2022-05-22 NOTE — Progress Notes (Signed)
[facility] CSW Progress Note  Visual merchandiser met with patient to provide support during 1st infusion. Pt reports doing well overall today. CSW discussed common feelings as treatment begins and reminded pt of support services at [facility]  Pt was sleepy from medicines, so visit was short today.    Brigette Hopfer E Wesleigh Markovic, LCSW

## 2022-05-22 NOTE — Patient Instructions (Signed)
[facility]  Discharge Instructions: Thank you for choosing [facility] to provide your oncology and hematology care.   If you have a lab appointment with the Cancer Center, please go directly to the Cancer Center and check in at the registration area.   Wear comfortable clothing and clothing appropriate for easy access to any Portacath or PICC line.   We strive to give you quality time with your provider. You may need to reschedule your appointment if you arrive late (15 or more minutes).  Arriving late affects you and other patients whose appointments are after yours.  Also, if you miss three or more appointments without notifying the office, you may be dismissed from the clinic at the provider's discretion.      For prescription refill requests, have your pharmacy contact our office and allow 72 hours for refills to be completed.    Today you received the following chemotherapy and/or immunotherapy agents paclitaxel, carboplatin, Vanessa Murphy      To help prevent nausea and vomiting after your treatment, we encourage you to take your nausea medication as directed.  BELOW ARE SYMPTOMS THAT SHOULD BE REPORTED IMMEDIATELY: *FEVER GREATER THAN 100.4 F (38 C) OR HIGHER *CHILLS OR SWEATING *NAUSEA AND VOMITING THAT IS NOT CONTROLLED WITH YOUR NAUSEA MEDICATION *UNUSUAL SHORTNESS OF BREATH *UNUSUAL BRUISING OR BLEEDING *URINARY PROBLEMS (pain or burning when urinating, or frequent urination) *BOWEL PROBLEMS (unusual diarrhea, constipation, pain near the anus) TENDERNESS IN MOUTH AND THROAT WITH OR WITHOUT PRESENCE OF ULCERS (sore throat, sores in mouth, or a toothache) UNUSUAL RASH, SWELLING OR PAIN  UNUSUAL VAGINAL DISCHARGE OR ITCHING   Items with * indicate a potential emergency and should be followed up as soon as possible or go to the Emergency Department if any problems should occur.  Please show the CHEMOTHERAPY ALERT CARD or  IMMUNOTHERAPY ALERT CARD at check-in to the Emergency Department and triage nurse.  Should you have questions after your visit or need to cancel or reschedule your appointment, please contact [facility] [facility]  Dept: [phone]  and follow the prompts.  Office hours are 8:00 a.m. to 4:30 p.m. Monday - Friday. Please note that voicemails left after 4:00 p.m. may not be returned until the following business day.  We are closed weekends and major holidays. You have access to a nurse at all times for urgent questions. Please call the main number to the clinic Dept: [phone] and follow the prompts.   For any non-urgent questions, you may also contact your provider using MyChart. We now offer e-Visits for anyone 50 and older to request care online for non-urgent symptoms. For details visit [email or web address].   Also download the MyChart app! Go to the app store, search "MyChart", open the app, select [place], and log in with your MyChart username and password.

## 2022-05-22 NOTE — Progress Notes (Signed)
[facility] CONSULT NOTE  Patient Care Team: Marva PandaMillsaps, Kimberly, NP as PCP - General Rachel MouldsIruku, Moriah Shawley, MD as Consulting Physician (Hematology and Oncology) Dorothy PufferMoody, John, MD as Consulting Physician (Radiation Oncology) Griselda Mineroth, Paul III, MD as Consulting Physician (General Surgery) Pershing ProudStuart, Dawn C, RN as Oncology Nurse Navigator Donnelly AngelicaMartini, Keisha N, RN as Oncology Nurse Navigator  CHIEF COMPLAINTS/PURPOSE OF CONSULTATION:  Newly diagnosed breast cancer  HISTORY OF PRESENTING ILLNESS:  Vanessa Murphy 42 y.o. female is here because of recent diagnosis of right breast cancer  I reviewed her records extensively and collaborated the history with the patient.  SUMMARY OF ONCOLOGIC HISTORY: Oncology History  Malignant neoplasm of upper-inner quadrant of right breast in female, estrogen receptor positive  04/24/2022 Mammogram   Bilateral diagnostic mammogram given palpable mass showed suspicious mass in the 3:00 location of the right breast for which biopsy was recommended.   04/25/2022 Pathology Results   Right breast needle core biopsy at 3:00 showed high-grade invasive ductal carcinoma.  Prognostic showed ER 40% positive weak staining intensity, PR 0% negative, group 5 HER2 negative and Ki-67 of 80%.   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 Report date is 05/14/2022.  The Custom Hereditary Cancers  Panel offered by [facility] includes sequencing and/or deletion duplication testing of the following 43 genes: APC, ATM, AXIN2, BAP1, BARD1, BMPR1A, BRCA1, BRCA2, BRIP1, CDH1, CDK4, CDKN2A (p14ARF and p16INK4a only), CHEK2, CTNNA1, EPCAM (Deletion/duplication testing only), FH, GREM1 (promoter region duplication testing only), HOXB13, KIT, MBD4, MEN1, MLH1, MSH2, MSH3, MSH6, MUTYH, NF1, NHTL1, PALB2, PDGFRA, PMS2, POLD1, POLE, PTEN, RAD51C, RAD51D, SMAD4, SMARCA4. STK11, TP53, TSC1, TSC2, and VHL.    This is a very pleasant 42 year old female patient with no significant past medical history referred to breast MDC for additional recommendations given new diagnosis of right breast high-grade invasive carcinoma.    Since her last visit here, she had another reading on her biopsy which once again confirmed a weak staining of the ER.  She considered a second biopsy but since her tumor is growing, she wants to proceed with treatment.  Since her last visit here, she had an MRI which recommended biopsy on the left side as well.  She had her port placed.  She feels that the right breast mass is growing.  No other complaints today.  Rest of the pertinent 10 point ROS reviewed and negative  FH She denies any history of breast cancer in her first-degree relatives but she says her family history is not very clear.  She does mention her paternal grandmother who had breast cancer in her 3620s.  Rest of the pertinent 10 point ROS reviewed and negative.  MEDICAL HISTORY:  Past Medical History:  Diagnosis Date   Migraines     SURGICAL HISTORY: Past Surgical History:  Procedure Laterality Date   BREAST BIOPSY Right 04/25/2022  [place] RT BREAST BX W LOC DEV 1ST LESION IMG BX SPEC US GUIDE 04/25/2022 [facility] MAMMOGRAPHY   IR IMAGING GUIDED PORT INSERTION  05/21/2022    SOCIAL HISTORY: Social History   Socioeconomic History   Marital status: Single    Spouse name: Not on file   Number of children: Not on file   Years  of education: Not on file   Highest education level: Not on file  Occupational History   Not on file  Tobacco Use   Smoking status: Former    Packs/day: .5    Types: Cigarettes    Quit date: 04/29/2022    Years since quitting: 0.0   Smokeless tobacco: Never  Substance and Sexual Activity   Alcohol use: Yes   Drug use: Never   Sexual activity: Yes    Birth control/protection: Surgical    Comment: MRI 05-16-22 Left Breast Lump found  Other Topics Concern   Not on file  Social History Narrative   Not on file   Social Determinants of Health   Financial Resource Strain: Medium Risk (05/07/2022)   Overall Financial Resource Strain (CARDIA)    Difficulty of Paying Living Expenses: Somewhat hard  Food Insecurity: No Food Insecurity (05/07/2022)   Hunger Vital Sign    Worried About Running Out of Food in the Last Year: Never true    Ran Out of Food in the Last Year: Never true  Transportation Needs: No Transportation Needs (05/07/2022)   PRAPARE - Administrator, Civil Service (Medical): No    Lack of Transportation (Non-Medical): No  Physical Activity: Not on file  Stress: Stress Concern Present (05/07/2022)   [employer] of Occupational Health - Occupational Stress Questionnaire    Feeling of Stress : Very much  Social Connections: Not on file  Intimate Partner Violence: Not on file    FAMILY HISTORY: Family History  Problem Relation Age of Onset   Melanoma Father 70 - 42       dx. again in his early 59s   Breast cancer Paternal Grandmother 58 - 38    ALLERGIES:  is allergic to contrast media [iodinated contrast media] and amitriptyline.  MEDICATIONS:  Current Outpatient Medications  Medication Sig Dispense Refill   dexamethasone (DECADRON) 4 MG tablet Take 2 tablets daily for 2 days, start the day after chemotherapy. Take with food. 30 tablet 1   fluticasone (FLONASE) 50 MCG/ACT nasal spray Place 2 sprays into both nostrils daily. OTC from [employer]      lidocaine-prilocaine (EMLA) cream Apply to affected area once 30 g 3   ondansetron (ZOFRAN) 8 MG tablet Take 1 tablet (8 mg total) by mouth every 8 (eight) hours as needed for nausea or vomiting. Start on the third day after chemotherapy. 30 tablet 1   prochlorperazine (COMPAZINE) 10 MG tablet Take 1 tablet (10 mg total) by mouth every 6 (six) hours as needed for nausea or vomiting. 30 tablet 1   No current facility-administered medications for this visit.   Facility-Administered Medications Ordered in Other Visits  Medication Dose Route Frequency Provider Last Rate Last Admin   CARBOplatin (PARAPLATIN) 170 mg in sodium chloride 0.9 % 100 mL chemo infusion  170 mg Intravenous Once Kajsa Butrum, MD       dexamethasone (DECADRON) 10 mg in sodium chloride 0.9 % 50 mL IVPB  10 mg Intravenous Once Kristiane Morsch, MD       famotidine (PEPCID) IVPB 20 mg premix  20 mg Intravenous Once Saryna Kneeland,  Burnice Logan, MD       heparin lock flush 100 unit/mL  500 Units Intracatheter Once PRN Lariza Cothron, Burnice Logan, MD       PACLitaxel (TAXOL) 132 mg in sodium chloride 0.9 % 250 mL chemo infusion (</= 80mg /m2)  80 mg/m2 (Treatment Plan Recorded) Intravenous Once Ajamu Maxon, Burnice Logan, MD       pembrolizumab (KEYTRUDA) 200 mg in sodium chloride 0.9 % 50 mL chemo infusion  200 mg Intravenous Once Itzel Lowrimore, MD       sodium chloride flush (NS) 0.9 % injection 10 mL  10 mL Intracatheter PRN Dannell Raczkowski, MD        REVIEW OF SYSTEMS:   Constitutional: Denies fevers, chills or abnormal night sweats Eyes: Denies blurriness of vision, double vision or watery eyes Ears, nose, mouth, throat, and face: Denies mucositis or sore throat Respiratory: Denies cough, dyspnea or wheezes Cardiovascular: Denies palpitation, chest discomfort or lower extremity swelling Gastrointestinal:  Denies nausea, heartburn or change in bowel habits Skin: Denies abnormal skin rashes Lymphatics: Denies new lymphadenopathy or easy  bruising Neurological:Denies numbness, tingling or new weaknesses Behavioral/Psych: Mood is stable, no new changes  Breast: Low denies any palpable lumps or discharge All other systems were reviewed with the patient and are negative.  PHYSICAL EXAMINATION: ECOG PERFORMANCE STATUS: 0 - Asymptomatic  Vitals:   05/22/22 1144  BP: 131/76  Pulse: 60  Resp: 16  Temp: 97.8 F (36.6 C)  SpO2: 100%   Filed Weights   05/22/22 1144  Weight: 139 lb 11.2 oz (63.4 kg)    GENERAL:alert, no distress and comfortable SKIN: skin color, texture, turgor are normal, no rashes or significant lesions EYES: normal, conjunctiva are pink and non-injected, sclera clear OROPHARYNX:no exudate, no erythema and lips, buccal mucosa, and tongue normal  NECK: supple, thyroid normal size, non-tender, without nodularity LYMPH:  no palpable lymphadenopathy in the cervical, axillary  LUNGS: clear to auscultation and percussion with normal breathing effort HEART: regular rate & rhythm and no murmurs and no lower extremity edema ABDOMEN:abdomen soft, non-tender and normal bowel sounds Musculoskeletal:no cyanosis of digits and no clubbing  PSYCH: alert & oriented x 3 with fluent speech NEURO: no focal motor/sensory deficits BREAST: Right breast inner lower quadrant mass, palpable about 3 cms * 4cms. no regional adenopathy.no palpable breast mass in the lower inner quadrant of the left breast.  LABORATORY DATA:  I have reviewed the data as listed Lab Results  Component Value Date   WBC 7.6 05/22/2022   HGB 12.7 05/22/2022   HCT 37.1 05/22/2022   MCV 91.6 05/22/2022   PLT 228 05/22/2022   Lab Results  Component Value Date   NA 136 05/22/2022   K 3.9 05/22/2022   CL 105 05/22/2022   CO2 26 05/22/2022    RADIOGRAPHIC STUDIES: I have personally reviewed the radiological reports and agreed with the findings in the report.  ASSESSMENT AND PLAN:  Malignant neoplasm of upper-inner quadrant of right breast in  female, estrogen receptor positive (HCC) This is a very pleasant 42 year old female patient with newly diagnosed right breast high-grade invasive ductal carcinoma, ER +40% weak staining, PR 0% negative, HER2 negative, Ki-67 of 80% referred to breast MDC for additional recommendations.  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  With tumor size larger than 2 cm, we have discussed about considering neoadjuvant chemotherapy based on keynote 522 study.  Other option would be to do neoadjuvant non anthracycline-based regimen since  she has node-negative disease.  Given high-grade, high proliferation index and weak staining of estrogen receptor, I do believe this is likely to behave like a triple negative breast cancer. 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  We did discuss that if she is uncomfortable with the option to treated like a triple negative breast cancer we can certainly try to omit immunotherapy and adjust chemotherapy regimen.  She however wanted to proceed with treatment as planned today.  She wants to defer another biopsy, she is worried that the cancer is already growing.  She will proceed with chemotherapy as planned today, left breast biopsy tomorrow and return to clinic before her next cycle of chemotherapy.  Thank you for consulting [place] in the care of this patient.  Please do not hesitate to contact us with any additional questions or concerns.   Total time spent: 30 minutes including history, physical exam, review of records, counseling and coordination of care All questions were answered. The patient knows to call the clinic with any problems, questions or concerns.    Rachel Moulds, MD 05/22/22

## 2022-05-23 ENCOUNTER — Ambulatory Visit
Admission: RE | Admit: 2022-05-23 | Discharge: 2022-05-23 | Disposition: A | Discharge status: Home / Self Care | Payer: BC Managed Care – PPO | Source: Ambulatory Visit | Attending: Hematology and Oncology | Admitting: Hematology and Oncology

## 2022-05-23 ENCOUNTER — Ambulatory Visit: Payer: Self-pay | Admitting: Genetic Counselor

## 2022-05-23 ENCOUNTER — Encounter: Payer: Self-pay | Admitting: *Deleted

## 2022-05-23 ENCOUNTER — Encounter: Payer: Self-pay | Admitting: Genetic Counselor

## 2022-05-23 ENCOUNTER — Telehealth: Payer: Self-pay | Admitting: *Deleted

## 2022-05-23 DIAGNOSIS — Z1379 Encounter for other screening for genetic and chromosomal anomalies: Secondary | ICD-10-CM

## 2022-05-23 DIAGNOSIS — R928 Other abnormal and inconclusive findings on diagnostic imaging of breast: Secondary | ICD-10-CM

## 2022-05-23 DIAGNOSIS — Z170 Estrogen receptor positive status [ER+]: Secondary | ICD-10-CM

## 2022-05-23 LAB — T4: T4, Total: 7.3 ug/dL (ref 4.5–12.0)

## 2022-05-23 MED ORDER — GADOPICLENOL 0.5 MMOL/ML IV SOLN
6.0000 mL | Freq: Once | INTRAVENOUS | Status: AC | PRN
  Administered 2022-05-23: 6 mL via INTRAVENOUS

## 2022-05-23 NOTE — Progress Notes (Signed)
HPI:   Vanessa Murphy was previously seen in the [facility] due to a personal and family history of cancer and concerns regarding a hereditary predisposition to cancer. Please refer to our prior cancer genetics clinic note for more information regarding our discussion, assessment and recommendations, at the time. Vanessa Murphy recent genetic test results were disclosed to her, as were recommendations warranted by these results. These results and recommendations are discussed in more detail below.  CANCER HISTORY:  Oncology History  Malignant neoplasm of upper-inner quadrant of right breast in female, estrogen receptor positive  04/24/2022 Mammogram   Bilateral diagnostic mammogram given palpable mass showed suspicious mass in the 3:00 location of the right breast for which biopsy was recommended.   04/25/2022 Pathology Results   Right breast needle core biopsy at 3:00 showed high-grade invasive ductal carcinoma.  Prognostic showed ER 40% positive weak staining intensity, PR 0% negative, group 5 HER2 negative and Ki-67 of 80%.   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 Report date is 05/14/2022.  The Custom Hereditary Cancers Panel offered by [facility] includes sequencing and/or deletion duplication testing of the following 43 genes: APC, ATM, AXIN2, BAP1, BARD1, BMPR1A, BRCA1, BRCA2, BRIP1, CDH1,  CDK4, CDKN2A (p14ARF and p16INK4a only), CHEK2, CTNNA1, EPCAM (Deletion/duplication testing only), FH, GREM1 (promoter region duplication testing only), HOXB13, KIT, MBD4, MEN1, MLH1, MSH2, MSH3, MSH6, MUTYH, NF1, NHTL1, PALB2, PDGFRA, PMS2, POLD1, POLE, PTEN, RAD51C, RAD51D, SMAD4, SMARCA4. STK11, TP53, TSC1, TSC2, and VHL.     FAMILY HISTORY:  We obtained a detailed, 4-generation family history.  Significant diagnoses are listed below:      Family History  Problem Relation Age of Onset   Melanoma Father 54 - 33        dx. again in his early 53s   Breast cancer Paternal Grandmother 76 - 25       Vanessa Murphy's father was diagnosed with melanoma in his late 34s and again in his early 6s. Her paternal grandmother was diagnosed with breast cancer in her 14s. Vanessa Murphy is unaware of previous family history of genetic testing for hereditary cancer risks. There is no reported Ashkenazi Jewish ancestry.   GENETIC TEST RESULTS:  The [facility] Custom Panel found no pathogenic mutations.  The Custom Hereditary Cancers Panel offered by [facility] includes sequencing and/or deletion duplication testing of the following 43 genes: APC, ATM, AXIN2, BAP1, BARD1, BMPR1A, BRCA1, BRCA2, BRIP1, CDH1, CDK4, CDKN2A (p14ARF and p16INK4a only), CHEK2, CTNNA1, EPCAM (Deletion/duplication testing only), FH, GREM1 (promoter region duplication testing only), HOXB13, KIT, MBD4, MEN1, MLH1, MSH2, MSH3, MSH6, MUTYH, NF1, NHTL1, PALB2, PDGFRA, PMS2, POLD1, POLE, PTEN, RAD51C, RAD51D, SMAD4, SMARCA4. STK11, TP53, TSC1, TSC2, and VHL.   The test report has been scanned into EPIC and is located under the Molecular Pathology section of the Results Review tab.  A  portion of the result report is included below for reference. Genetic testing reported out on 05/14/2022.     Even though a pathogenic variant was not identified, possible explanations for the cancer in the family may include: There may be no hereditary risk for  cancer in the family. The cancers in Vanessa Murphy and/or her family may be due to other genetic or environmental factors. There may be a gene mutation in one of these genes that current testing methods cannot detect, but that chance is small. There could be another gene that has not yet been discovered, or that we have not yet tested, that is responsible for the cancer diagnoses in the family.  It is also possible there is a hereditary cause for the cancer in the family that Vanessa Murphy did not inherit.  Therefore, it is important to remain in touch with cancer genetics in the future so that we can continue to offer Vanessa Murphy the most up to date genetic testing.   ADDITIONAL GENETIC TESTING:  We discussed with Vanessa Murphy that her genetic testing was fairly extensive.  If there are genes identified to increase cancer risk that can be analyzed in the future, we would be happy to discuss and coordinate this testing at that time.    CANCER SCREENING RECOMMENDATIONS:  Vanessa Murphy test result is considered negative (normal).  This means that we have not identified a hereditary cause for her personal and family history of cancer at this time.   An individual's cancer risk and medical management are not determined by genetic test results alone. Overall cancer risk assessment incorporates additional factors, including personal medical history, family history, and any available genetic information that may result in a personalized plan for cancer prevention and surveillance. Therefore, it is recommended she continue to follow the cancer management and screening guidelines provided by her oncology and primary healthcare provider.  RECOMMENDATIONS FOR FAMILY MEMBERS:   Since she did not inherit a mutation in a cancer predisposition gene included on this panel, her daughter could not have inherited a mutation from her in one of these genes. Individuals in this family might be at some increased risk  of developing cancer, over the general population risk, due to the family history of cancer. We recommend women in this family have a yearly mammogram beginning at age 47, or 50 years younger than the earliest onset of cancer, an annual clinical breast exam, and perform monthly breast self-exams.  FOLLOW-UP:  Cancer genetics is a rapidly advancing field and it is possible that new genetic tests will be appropriate for her and/or her family members in the future. We encouraged her to remain in contact with cancer genetics on an annual basis so we can update her personal and family histories and let her know of advances in cancer genetics that may benefit this family.   Our contact number was provided. Ms. Dilella questions were answered to her satisfaction, and she knows she is welcome to call us at anytime with additional questions or concerns.   Lalla Brothers, MS, [facility] Genetic Counselor [email or web address] (P) [phone]

## 2022-05-23 NOTE — Telephone Encounter (Addendum)
-----   Message from Edger House, RN sent at 05/22/2022  4:19 PM EDT ----- Regarding: FT chemo Vanessa Murphy Pt completed first time chemo: keytruda, paclitaxel, carboplatin without incidence. Vanessa Murphy.  Per contact- pt states increased drowsness post treatment- but that wore off and she was able to sleep for 5 hours. She had MRI bx this AM - and then proceeded to work post.  She noticed some nausea around 2pm but was able to continue to work as well as she took a compazine with benefit.  She also notes some general body malaise " just aching"  She notices the decadron gives her "like I feel jumpy"  She is able to hydrate well.  Above discussed- with pt understanding possible benefit with OTC benadryl for sleep.  No further needs at this time.  Pt states she has number to call if she has any issues over the weekend.

## 2022-05-26 ENCOUNTER — Encounter: Payer: Self-pay | Admitting: *Deleted

## 2022-05-27 ENCOUNTER — Encounter: Payer: Self-pay | Admitting: *Deleted

## 2022-05-27 MED FILL — Dexamethasone Sodium Phosphate Inj 100 MG/10ML: INTRAMUSCULAR | Qty: 1 | Status: AC

## 2022-05-28 ENCOUNTER — Other Ambulatory Visit: Payer: BC Managed Care – PPO

## 2022-05-28 ENCOUNTER — Inpatient Hospital Stay: Payer: BC Managed Care – PPO

## 2022-05-28 VITALS — BP 110/75 | HR 68 | Temp 98.7°F | Resp 18 | Wt 137.5 lb

## 2022-05-28 DIAGNOSIS — Z170 Estrogen receptor positive status [ER+]: Secondary | ICD-10-CM

## 2022-05-28 DIAGNOSIS — C50211 Malignant neoplasm of upper-inner quadrant of right female breast: Secondary | ICD-10-CM

## 2022-05-28 DIAGNOSIS — Z95828 Presence of other vascular implants and grafts: Secondary | ICD-10-CM

## 2022-05-28 LAB — CBC WITH DIFFERENTIAL (CANCER CENTER ONLY)
Abs Immature Granulocytes: 0.04 10*3/uL (ref 0.00–0.07)
Basophils Absolute: 0 10*3/uL (ref 0.0–0.1)
Basophils Relative: 0 %
Eosinophils Absolute: 0.1 10*3/uL (ref 0.0–0.5)
Eosinophils Relative: 1 %
HCT: 37.5 % (ref 36.0–46.0)
Hemoglobin: 13 g/dL (ref 12.0–15.0)
Immature Granulocytes: 1 %
Lymphocytes Relative: 23 %
Lymphs Abs: 1.7 10*3/uL (ref 0.7–4.0)
MCH: 31.1 pg (ref 26.0–34.0)
MCHC: 34.7 g/dL (ref 30.0–36.0)
MCV: 89.7 fL (ref 80.0–100.0)
Monocytes Absolute: 0.3 10*3/uL (ref 0.1–1.0)
Monocytes Relative: 4 %
Neutro Abs: 5.1 10*3/uL (ref 1.7–7.7)
Neutrophils Relative %: 71 %
Platelet Count: 206 10*3/uL (ref 150–400)
RBC: 4.18 MIL/uL (ref 3.87–5.11)
RDW: 12.5 % (ref 11.5–15.5)
WBC Count: 7.2 10*3/uL (ref 4.0–10.5)
nRBC: 0 % (ref 0.0–0.2)

## 2022-05-28 LAB — CMP (CANCER CENTER ONLY)
ALT: 12 U/L (ref 0–44)
AST: 10 U/L — ABNORMAL LOW (ref 15–41)
Albumin: 4.4 g/dL (ref 3.5–5.0)
Alkaline Phosphatase: 49 U/L (ref 38–126)
Anion gap: 4 — ABNORMAL LOW (ref 5–15)
BUN: 11 mg/dL (ref 6–20)
CO2: 26 mmol/L (ref 22–32)
Calcium: 9.4 mg/dL (ref 8.9–10.3)
Chloride: 103 mmol/L (ref 98–111)
Creatinine: 0.59 mg/dL (ref 0.44–1.00)
GFR, Estimated: >60 mL/min (ref >60)
Glucose, Bld: 133 mg/dL — ABNORMAL HIGH (ref 70–99)
Potassium: 3.8 mmol/L (ref 3.5–5.1)
Sodium: 133 mmol/L — ABNORMAL LOW (ref 135–145)
Total Bilirubin: 0.5 mg/dL (ref 0.3–1.2)
Total Protein: 6.9 g/dL (ref 6.5–8.1)

## 2022-05-28 MED ORDER — FAMOTIDINE IN NACL 20-0.9 MG/50ML-% IV SOLN
20.0000 mg | Freq: Once | INTRAVENOUS | Status: AC
  Administered 2022-05-28: 20 mg via INTRAVENOUS
  Filled 2022-05-28: qty 50

## 2022-05-28 MED ORDER — SODIUM CHLORIDE 0.9 % IV SOLN: Freq: Once | INTRAVENOUS | Status: AC

## 2022-05-28 MED ORDER — SODIUM CHLORIDE 0.9 % IV SOLN
169.8000 mg | Freq: Once | INTRAVENOUS | Status: AC
  Administered 2022-05-28: 170 mg via INTRAVENOUS
  Filled 2022-05-28: qty 17

## 2022-05-28 MED ORDER — DIPHENHYDRAMINE HCL 50 MG/ML IJ SOLN
50.0000 mg | Freq: Once | INTRAMUSCULAR | Status: AC
  Administered 2022-05-28: 50 mg via INTRAVENOUS
  Filled 2022-05-28: qty 1

## 2022-05-28 MED ORDER — SODIUM CHLORIDE 0.9% FLUSH
10.0000 mL | Freq: Once | INTRAVENOUS | Status: AC
  Administered 2022-05-28: 10 mL

## 2022-05-28 MED ORDER — SODIUM CHLORIDE 0.9 % IV SOLN
80.0000 mg/m2 | Freq: Once | INTRAVENOUS | Status: AC
  Administered 2022-05-28: 132 mg via INTRAVENOUS
  Filled 2022-05-28: qty 22

## 2022-05-28 MED ORDER — PALONOSETRON HCL INJECTION 0.25 MG/5ML
0.2500 mg | Freq: Once | INTRAVENOUS | Status: AC
  Administered 2022-05-28: 0.25 mg via INTRAVENOUS
  Filled 2022-05-28: qty 5

## 2022-05-28 MED ORDER — SODIUM CHLORIDE 0.9% FLUSH
10.0000 mL | INTRAVENOUS | Status: DC | PRN
  Administered 2022-05-28: 10 mL

## 2022-05-28 MED ORDER — SODIUM CHLORIDE 0.9 % IV SOLN
10.0000 mg | Freq: Once | INTRAVENOUS | Status: AC
  Administered 2022-05-28: 10 mg via INTRAVENOUS
  Filled 2022-05-28: qty 10

## 2022-05-28 MED ORDER — HEPARIN SOD (PORK) LOCK FLUSH 100 UNIT/ML IV SOLN
500.0000 [IU] | Freq: Once | INTRAVENOUS | Status: AC | PRN
  Administered 2022-05-28: 500 [IU]

## 2022-05-28 NOTE — Patient Instructions (Signed)
[facility]  Discharge Instructions: Thank you for choosing [facility] to provide your oncology and hematology care.   If you have a lab appointment with the Cancer Center, please go directly to the Cancer Center and check in at the registration area.   Wear comfortable clothing and clothing appropriate for easy access to any Portacath or PICC line.   We strive to give you quality time with your provider. You may need to reschedule your appointment if you arrive late (15 or more minutes).  Arriving late affects you and other patients whose appointments are after yours.  Also, if you miss three or more appointments without notifying the office, you may be dismissed from the clinic at the provider's discretion.      For prescription refill requests, have your pharmacy contact our office and allow 72 hours for refills to be completed.    Today you received the following chemotherapy and/or immunotherapy agents :  Paclitaxel & Carboplatin.      To help prevent nausea and vomiting after your treatment, we encourage you to take your nausea medication as directed.  BELOW ARE SYMPTOMS THAT SHOULD BE REPORTED IMMEDIATELY: *FEVER GREATER THAN 100.4 F (38 C) OR HIGHER *CHILLS OR SWEATING *NAUSEA AND VOMITING THAT IS NOT CONTROLLED WITH YOUR NAUSEA MEDICATION *UNUSUAL SHORTNESS OF BREATH *UNUSUAL BRUISING OR BLEEDING *URINARY PROBLEMS (pain or burning when urinating, or frequent urination) *BOWEL PROBLEMS (unusual diarrhea, constipation, pain near the anus) TENDERNESS IN MOUTH AND THROAT WITH OR WITHOUT PRESENCE OF ULCERS (sore throat, sores in mouth, or a toothache) UNUSUAL RASH, SWELLING OR PAIN  UNUSUAL VAGINAL DISCHARGE OR ITCHING   Items with * indicate a potential emergency and should be followed up as soon as possible or go to the Emergency Department if any problems should occur.  Please show the CHEMOTHERAPY ALERT CARD or IMMUNOTHERAPY  ALERT CARD at check-in to the Emergency Department and triage nurse.  Should you have questions after your visit or need to cancel or reschedule your appointment, please contact [facility]  Dept: [phone]  and follow the prompts.  Office hours are 8:00 a.m. to 4:30 p.m. Monday - Friday. Please note that voicemails left after 4:00 p.m. may not be returned until the following business day.  We are closed weekends and major holidays. You have access to a nurse at all times for urgent questions. Please call the main number to the clinic Dept: [phone] and follow the prompts.   For any non-urgent questions, you may also contact your provider using MyChart. We now offer e-Visits for anyone 18 and older to request care online for non-urgent symptoms. For details visit [email or web address].   Also download the MyChart app! Go to the app store, search "MyChart", open the app, select [facility], and log in with your MyChart username and password. 

## 2022-05-29 ENCOUNTER — Encounter: Payer: Self-pay | Admitting: Hematology and Oncology

## 2022-05-29 NOTE — Telephone Encounter (Signed)
This RN spoke with pt per her request to review her labs and if she needed any dietary changes.   Of note this RN spoke with yesterday at C1 D8 treatment per her request due to post first treatment she had severe constipation. Patient used biscodyl with benefit-  "But that was after 2 days of severe constipation that was so uncomfortable" with recommendation for pt to institute 1 biscodyl daily post chemo for regulation of bowel movements- as well as adding a stool softner.  Pt states presently bowels are moving well with above recommendation.  This RN reviewed labs with discussion of overall good readings- discussed mildly decreased sodium- which the patient states she has been increasing her fluid intake- primarily with water.  This RN discussed benefit of increasing sodium intake ( pt usually does not add extra salt to foods).  Reviewed mild elevation in glucose- with pt verifying she had eaten prior to lab.  Per discussion pt did ask about getting her further appointments scheduled with request for earliest in the day for accommodation with her work.  This RN sent a scheduling note per above.  No further needs at this time.

## 2022-06-02 ENCOUNTER — Telehealth: Payer: Self-pay | Admitting: *Deleted

## 2022-06-02 NOTE — Telephone Encounter (Signed)
Patient call-For past 3 days has had abdominal discomfort/ache and felt as though intestines were "irritated". Highest Temp 99.1.Denies problems with bowel movements and urination. States appetite decreased, but not nauseated. Took advil yesterday and felt some relief. Patient has appt tomorrow with Dr. Al Pimple at 1245.  Dr. Al Pimple informed of patient message.  If appointment available, schedule with [facility] this afternoon. [facility] does not have any appts open this afternoon, earliest would be tomorrow. Dr. Al Pimple informed that no [facility] appt open today.   Per MD, contact patient and advise if she doesn't feel well to wait till tomorrow, please ask her to go to the ED   Contacted patient with Dr. Remonia Richter response to go to ED if she feels worse or if pain becomes intolerable. Patient verbalized understanding and but states at this time she thinks she will wait and see Dr. Al Pimple tomorrow at scheduled appt.

## 2022-06-03 ENCOUNTER — Other Ambulatory Visit: Payer: BC Managed Care – PPO

## 2022-06-03 ENCOUNTER — Inpatient Hospital Stay: Payer: BC Managed Care – PPO

## 2022-06-03 ENCOUNTER — Inpatient Hospital Stay (HOSPITAL_BASED_OUTPATIENT_CLINIC_OR_DEPARTMENT_OTHER): Payer: BC Managed Care – PPO | Admitting: Hematology and Oncology

## 2022-06-03 VITALS — BP 137/85 | HR 72 | Temp 97.5°F | Resp 18 | Ht 64.0 in | Wt 137.5 lb

## 2022-06-03 DIAGNOSIS — C50211 Malignant neoplasm of upper-inner quadrant of right female breast: Secondary | ICD-10-CM

## 2022-06-03 DIAGNOSIS — Z170 Estrogen receptor positive status [ER+]: Secondary | ICD-10-CM

## 2022-06-03 DIAGNOSIS — Z95828 Presence of other vascular implants and grafts: Secondary | ICD-10-CM

## 2022-06-03 DIAGNOSIS — M898X9 Other specified disorders of bone, unspecified site: Secondary | ICD-10-CM | POA: Diagnosis not present

## 2022-06-03 LAB — CBC WITH DIFFERENTIAL (CANCER CENTER ONLY)
Abs Immature Granulocytes: 0.04 10*3/uL (ref 0.00–0.07)
Basophils Absolute: 0 10*3/uL (ref 0.0–0.1)
Basophils Relative: 0 %
Eosinophils Absolute: 0.2 10*3/uL (ref 0.0–0.5)
Eosinophils Relative: 2 %
HCT: 35.2 % — ABNORMAL LOW (ref 36.0–46.0)
Hemoglobin: 12.3 g/dL (ref 12.0–15.0)
Immature Granulocytes: 1 %
Lymphocytes Relative: 23 %
Lymphs Abs: 1.6 10*3/uL (ref 0.7–4.0)
MCH: 31.8 pg (ref 26.0–34.0)
MCHC: 34.9 g/dL (ref 30.0–36.0)
MCV: 91 fL (ref 80.0–100.0)
Monocytes Absolute: 0.3 10*3/uL (ref 0.1–1.0)
Monocytes Relative: 4 %
Neutro Abs: 4.8 10*3/uL (ref 1.7–7.7)
Neutrophils Relative %: 70 %
Platelet Count: 216 10*3/uL (ref 150–400)
RBC: 3.87 MIL/uL (ref 3.87–5.11)
RDW: 12.8 % (ref 11.5–15.5)
WBC Count: 6.9 10*3/uL (ref 4.0–10.5)
nRBC: 0 % (ref 0.0–0.2)

## 2022-06-03 LAB — CMP (CANCER CENTER ONLY)
ALT: 14 U/L (ref 0–44)
AST: 12 U/L — ABNORMAL LOW (ref 15–41)
Albumin: 4.1 g/dL (ref 3.5–5.0)
Alkaline Phosphatase: 45 U/L (ref 38–126)
Anion gap: 5 (ref 5–15)
BUN: 8 mg/dL (ref 6–20)
CO2: 25 mmol/L (ref 22–32)
Calcium: 9.1 mg/dL (ref 8.9–10.3)
Chloride: 104 mmol/L (ref 98–111)
Creatinine: 0.65 mg/dL (ref 0.44–1.00)
GFR, Estimated: >60 mL/min (ref >60)
Glucose, Bld: 102 mg/dL — ABNORMAL HIGH (ref 70–99)
Potassium: 3.9 mmol/L (ref 3.5–5.1)
Sodium: 134 mmol/L — ABNORMAL LOW (ref 135–145)
Total Bilirubin: 0.4 mg/dL (ref 0.3–1.2)
Total Protein: 6.4 g/dL — ABNORMAL LOW (ref 6.5–8.1)

## 2022-06-03 MED ORDER — TRAMADOL HCL 50 MG PO TABS: 50.0000 mg | ORAL_TABLET | Freq: Two times a day (BID) | ORAL | 0 refills | Status: DC | PRN

## 2022-06-03 MED ORDER — SODIUM CHLORIDE 0.9% FLUSH
10.0000 mL | Freq: Once | INTRAVENOUS | Status: AC
  Administered 2022-06-03: 10 mL

## 2022-06-03 MED ORDER — HEPARIN SOD (PORK) LOCK FLUSH 100 UNIT/ML IV SOLN
500.0000 [IU] | Freq: Once | INTRAVENOUS | Status: AC
  Administered 2022-06-03: 500 [IU]

## 2022-06-03 MED FILL — Dexamethasone Sodium Phosphate Inj 100 MG/10ML: INTRAMUSCULAR | Qty: 1 | Status: AC

## 2022-06-03 NOTE — Progress Notes (Signed)
[facility] CONSULT NOTE  Patient Care Team: Marva Panda, NP as PCP - General Rachel Moulds, MD as Consulting Physician (Hematology and Oncology) Dorothy Puffer, MD as Consulting Physician (Radiation Oncology) Griselda Miner, MD as Consulting Physician (General Surgery) Pershing Proud, RN as Oncology Nurse Navigator Donnelly Angelica, RN as Oncology Nurse Navigator  CHIEF COMPLAINTS/PURPOSE OF CONSULTATION:  Newly diagnosed breast cancer  HISTORY OF PRESENTING ILLNESS:  Vanessa Murphy 42 y.o. female is here because of recent diagnosis of right breast cancer  I reviewed her records extensively and collaborated the history with the patient.  SUMMARY OF ONCOLOGIC HISTORY: Oncology History  Malignant neoplasm of upper-inner quadrant of right breast in female, estrogen receptor positive  04/24/2022 Mammogram   Bilateral diagnostic mammogram given palpable mass showed suspicious mass in the 3:00 location of the right breast for which biopsy was recommended.   04/25/2022 Pathology Results   Right breast needle core biopsy at 3:00 showed high-grade invasive ductal carcinoma.  Prognostic showed ER 40% positive weak staining intensity, PR 0% negative, group 5 HER2 negative and Ki-67 of 80%.   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 Report date is 05/14/2022.  The Custom Hereditary Cancers  Panel offered by [facility] includes sequencing and/or deletion duplication testing of the following 43 genes: APC, ATM, AXIN2, BAP1, BARD1, BMPR1A, BRCA1, BRCA2, BRIP1, CDH1, CDK4, CDKN2A (p14ARF and p16INK4a only), CHEK2, CTNNA1, EPCAM (Deletion/duplication testing only), FH, GREM1 (promoter region duplication testing only), HOXB13, KIT, MBD4, MEN1, MLH1, MSH2, MSH3, MSH6, MUTYH, NF1, NHTL1, PALB2, PDGFRA, PMS2, POLD1, POLE, PTEN, RAD51C, RAD51D, SMAD4, SMARCA4. STK11, TP53, TSC1, TSC2, and VHL.    She is on neoadjuvant treatment and is here for follow up.  We received a phone call from her yesterday complaining of severe pain in the lower abdomen bilaterally, no change in bowel habits.  Today she continues to complain of discomfort in the abdomen and bilateral lower abdomen in the iliac region, symmetrical.  She describes it as strings being pulled on both sides and pain could last for several minutes, its mostly described as aches.  She had a good bowel movement today.  She otherwise denies any nausea or vomiting.  She has also noticed some pain in both of her knees. She had a low-grade fever of 99 degrees.  Rest of the pertinent 10 point ROS reviewed and negative  MEDICAL HISTORY:  Past Medical History:  Diagnosis Date   Migraines     SURGICAL HISTORY: Past Surgical History:  Procedure Laterality Date   BREAST BIOPSY Right 04/25/2022   [place] RT BREAST BX W LOC DEV 1ST LESION IMG BX SPEC US GUIDE 04/25/2022 [facility] MAMMOGRAPHY   IR IMAGING GUIDED PORT INSERTION  05/21/2022    SOCIAL HISTORY: Social History   Socioeconomic History   Marital status: Single    Spouse name: Not  on file   Number of children: Not on file   Years of education: Not on file   Highest education level: Not on file  Occupational History   Not on file  Tobacco Use   Smoking status: Former    Packs/day: .5    Types: Cigarettes    Quit date: 04/29/2022    Years since quitting: 0.0   Smokeless tobacco: Never   Substance and Sexual Activity   Alcohol use: Yes   Drug use: Never   Sexual activity: Yes    Birth control/protection: Surgical    Comment: MRI 05-16-22 Left Breast Lump found  Other Topics Concern   Not on file  Social History Narrative   Not on file   Social Determinants of Health   Financial Resource Strain: Medium Risk (05/07/2022)   Overall Financial Resource Strain (CARDIA)    Difficulty of Paying Living Expenses: Somewhat hard  Food Insecurity: No Food Insecurity (05/07/2022)   Hunger Vital Sign    Worried About Running Out of Food in the Last Year: Never true    Ran Out of Food in the Last Year: Never true  Transportation Needs: No Transportation Needs (05/07/2022)   PRAPARE - Administrator, Civil Service (Medical): No    Lack of Transportation (Non-Medical): No  Physical Activity: Not on file  Stress: Stress Concern Present (05/07/2022)   [employer] of Occupational Health - Occupational Stress Questionnaire    Feeling of Stress : Very much  Social Connections: Not on file  Intimate Partner Violence: Not on file    FAMILY HISTORY: Family History  Problem Relation Age of Onset   Melanoma Father 34 - 49       dx. again in his early 65s   Breast cancer Paternal Grandmother 72 - 72    ALLERGIES:  is allergic to contrast media [iodinated contrast media] and amitriptyline.  MEDICATIONS:  Current Outpatient Medications  Medication Sig Dispense Refill   traMADol (ULTRAM) 50 MG tablet Take 1 tablet (50 mg total) by mouth every 12 (twelve) hours as needed. 10 tablet 0   dexamethasone (DECADRON) 4 MG tablet Take 2 tablets daily for 2 days, start the day after chemotherapy. Take with food. 30 tablet 1   fluticasone (FLONASE) 50 MCG/ACT nasal spray Place 2 sprays into both nostrils daily. OTC from [employer]     lidocaine-prilocaine (EMLA) cream Apply to affected area once 30 g 3   ondansetron (ZOFRAN) 8 MG tablet Take 1 tablet (8 mg total) by mouth every 8  (eight) hours as needed for nausea or vomiting. Start on the third day after chemotherapy. 30 tablet 1   prochlorperazine (COMPAZINE) 10 MG tablet Take 1 tablet (10 mg total) by mouth every 6 (six) hours as needed for nausea or vomiting. 30 tablet 1   No current facility-administered medications for this visit.    REVIEW OF SYSTEMS:   Constitutional: Denies fevers, chills or abnormal night sweats Eyes: Denies blurriness of vision, double vision or watery eyes Ears, nose, mouth, throat, and face: Denies mucositis or sore throat Respiratory: Denies cough, dyspnea or wheezes Cardiovascular: Denies palpitation, chest discomfort or lower extremity swelling Gastrointestinal:  Denies nausea, heartburn or change in bowel habits Skin: Denies abnormal skin rashes Lymphatics: Denies new lymphadenopathy or easy bruising Neurological:Denies numbness, tingling or new weaknesses Behavioral/Psych: Mood is stable, no new changes  Breast: Low denies any palpable lumps or discharge All other systems were reviewed with the patient and are negative.  PHYSICAL  EXAMINATION: ECOG PERFORMANCE STATUS: 0 - Asymptomatic  Vitals:   06/03/22 1213  BP: 137/85  Pulse: 72  Resp: 18  Temp: (!) 97.5 F (36.4 C)  SpO2: 100%   Filed Weights   06/03/22 1213  Weight: 137 lb 8 oz (62.4 kg)    GENERAL:alert, no distress and comfortable Breast exam deferred today Abdominal: Soft, nontender, nondistended, no guarding, rigidity. No lower extremity edema.Marland Kitchen  LABORATORY DATA:  I have reviewed the data as listed Lab Results  Component Value Date   WBC 6.9 06/03/2022   HGB 12.3 06/03/2022   HCT 35.2 (L) 06/03/2022   MCV 91.0 06/03/2022   PLT 216 06/03/2022   Lab Results  Component Value Date   NA 134 (L) 06/03/2022   K 3.9 06/03/2022   CL 104 06/03/2022   CO2 25 06/03/2022    RADIOGRAPHIC STUDIES: I have personally reviewed the radiological reports and agreed with the findings in the  report.  ASSESSMENT AND PLAN:  Malignant neoplasm of upper-inner quadrant of right breast in female, estrogen receptor positive (HCC) This is a very pleasant 42 year old female patient with newly diagnosed right breast high-grade invasive ductal carcinoma, ER +40% weak staining, PR 0% negative, HER2 negative, Ki-67 of 80% referred to breast [facility] for additional recommendations.  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  With tumor size larger than 2 cm, we have discussed about considering neoadjuvant chemotherapy based on keynote 522 study.  Other option would be to do neoadjuvant non anthracycline-based regimen since she has node-negative disease.   Given high-grade, high proliferation index and weak staining of estrogen receptor, I do believe this is likely to behave like a triple negative breast cancer. 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  We did discuss that if she is uncomfortable with the option to treated like a triple negative breast cancer we can certainly try to omit immunotherapy and adjust chemotherapy regimen.    She is now here for an interim visit with a severe bilateral lower abdominal pain, more like an ache.  It appears that she is describing actually pain in the hips and bilateral knees likely related to the growth factor.  She also had a low-grade fever again likely related to the G-CSF.  No abdominal findings on exam.  I gave her a small prescription for tramadol.  She can use this as needed for the pain from the G-CSF.  She was strictly recommended to call us back if the pain does not get better in the next 2 to 3 days.  She expressed understanding.  Total time spent: 30 minutes including history, physical exam, review of records, counseling and coordination of care All questions were answered. The patient knows  to call the clinic with any problems, questions or concerns.    Rachel Moulds, MD 06/03/22

## 2022-06-03 NOTE — Assessment & Plan Note (Addendum)
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  With tumor size larger than 2 cm, we have discussed about considering neoadjuvant chemotherapy based on keynote 522 study.  Other option would be to do neoadjuvant non anthracycline-based regimen since she has node-negative disease.   Given high-grade, high proliferation index and weak staining of estrogen receptor, I do believe this is likely to behave like a triple negative breast cancer. 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  We did discuss that if she is uncomfortable with the option to treated like a triple negative breast cancer we can certainly try to omit immunotherapy and adjust chemotherapy regimen.    She is now here for an interim visit with a severe bilateral lower abdominal pain, more like an ache.  It appears that she is describing actually pain in the hips and bilateral knees likely related to the growth factor.  She also had a low-grade fever again likely related to the G-CSF.  No abdominal findings on exam.  I gave her a small prescription for tramadol.  She can use this as needed for the pain from the G-CSF.  She was strictly recommended to call us back if the pain does not get better in the next 2 to 3 days.  She expressed understanding.

## 2022-06-04 ENCOUNTER — Inpatient Hospital Stay: Payer: BC Managed Care – PPO

## 2022-06-04 VITALS — BP 106/74 | HR 74 | Temp 98.6°F | Resp 18 | Wt 137.8 lb

## 2022-06-04 DIAGNOSIS — C50211 Malignant neoplasm of upper-inner quadrant of right female breast: Secondary | ICD-10-CM | POA: Diagnosis not present

## 2022-06-04 DIAGNOSIS — Z170 Estrogen receptor positive status [ER+]: Secondary | ICD-10-CM

## 2022-06-04 MED ORDER — SODIUM CHLORIDE 0.9% FLUSH
10.0000 mL | INTRAVENOUS | Status: DC | PRN
  Administered 2022-06-04: 10 mL

## 2022-06-04 MED ORDER — SODIUM CHLORIDE 0.9 % IV SOLN
169.8000 mg | Freq: Once | INTRAVENOUS | Status: AC
  Administered 2022-06-04: 170 mg via INTRAVENOUS
  Filled 2022-06-04: qty 17

## 2022-06-04 MED ORDER — SODIUM CHLORIDE 0.9 % IV SOLN
10.0000 mg | Freq: Once | INTRAVENOUS | Status: AC
  Administered 2022-06-04: 10 mg via INTRAVENOUS
  Filled 2022-06-04: qty 10

## 2022-06-04 MED ORDER — PALONOSETRON HCL INJECTION 0.25 MG/5ML
0.2500 mg | Freq: Once | INTRAVENOUS | Status: AC
  Administered 2022-06-04: 0.25 mg via INTRAVENOUS
  Filled 2022-06-04: qty 5

## 2022-06-04 MED ORDER — FAMOTIDINE IN NACL 20-0.9 MG/50ML-% IV SOLN
20.0000 mg | Freq: Once | INTRAVENOUS | Status: AC
  Administered 2022-06-04: 20 mg via INTRAVENOUS
  Filled 2022-06-04: qty 50

## 2022-06-04 MED ORDER — SODIUM CHLORIDE 0.9 % IV SOLN: Freq: Once | INTRAVENOUS | Status: AC

## 2022-06-04 MED ORDER — DIPHENHYDRAMINE HCL 50 MG/ML IJ SOLN
50.0000 mg | Freq: Once | INTRAMUSCULAR | Status: AC
  Administered 2022-06-04: 50 mg via INTRAVENOUS
  Filled 2022-06-04: qty 1

## 2022-06-04 MED ORDER — HEPARIN SOD (PORK) LOCK FLUSH 100 UNIT/ML IV SOLN
500.0000 [IU] | Freq: Once | INTRAVENOUS | Status: AC | PRN
  Administered 2022-06-04: 500 [IU]

## 2022-06-04 MED ORDER — SODIUM CHLORIDE 0.9 % IV SOLN
80.0000 mg/m2 | Freq: Once | INTRAVENOUS | Status: AC
  Administered 2022-06-04: 132 mg via INTRAVENOUS
  Filled 2022-06-04: qty 22

## 2022-06-04 NOTE — Patient Instructions (Signed)
[facility]  Discharge Instructions: Thank you for choosing [facility] to provide your oncology and hematology care.   If you have a lab appointment with the Cancer Center, please go directly to the Cancer Center and check in at the registration area.   Wear comfortable clothing and clothing appropriate for easy access to any Portacath or PICC line.   We strive to give you quality time with your provider. You may need to reschedule your appointment if you arrive late (15 or more minutes).  Arriving late affects you and other patients whose appointments are after yours.  Also, if you miss three or more appointments without notifying the office, you may be dismissed from the clinic at the provider's discretion.      For prescription refill requests, have your pharmacy contact our office and allow 72 hours for refills to be completed.    Today you received the following chemotherapy and/or immunotherapy agents :  Paclitaxel & Carboplatin.      To help prevent nausea and vomiting after your treatment, we encourage you to take your nausea medication as directed.  BELOW ARE SYMPTOMS THAT SHOULD BE REPORTED IMMEDIATELY: *FEVER GREATER THAN 100.4 F (38 C) OR HIGHER *CHILLS OR SWEATING *NAUSEA AND VOMITING THAT IS NOT CONTROLLED WITH YOUR NAUSEA MEDICATION *UNUSUAL SHORTNESS OF BREATH *UNUSUAL BRUISING OR BLEEDING *URINARY PROBLEMS (pain or burning when urinating, or frequent urination) *BOWEL PROBLEMS (unusual diarrhea, constipation, pain near the anus) TENDERNESS IN MOUTH AND THROAT WITH OR WITHOUT PRESENCE OF ULCERS (sore throat, sores in mouth, or a toothache) UNUSUAL RASH, SWELLING OR PAIN  UNUSUAL VAGINAL DISCHARGE OR ITCHING   Items with * indicate a potential emergency and should be followed up as soon as possible or go to the Emergency Department if any problems should occur.  Please show the CHEMOTHERAPY ALERT CARD or IMMUNOTHERAPY  ALERT CARD at check-in to the Emergency Department and triage nurse.  Should you have questions after your visit or need to cancel or reschedule your appointment, please contact [facility]  Dept: [phone]  and follow the prompts.  Office hours are 8:00 a.m. to 4:30 p.m. Monday - Friday. Please note that voicemails left after 4:00 p.m. may not be returned until the following business day.  We are closed weekends and major holidays. You have access to a nurse at all times for urgent questions. Please call the main number to the clinic Dept: [phone] and follow the prompts.   For any non-urgent questions, you may also contact your provider using MyChart. We now offer e-Visits for anyone 18 and older to request care online for non-urgent symptoms. For details visit [email or web address].   Also download the MyChart app! Go to the app store, search "MyChart", open the app, select [facility], and log in with your MyChart username and password. 

## 2022-06-05 ENCOUNTER — Inpatient Hospital Stay: Payer: BC Managed Care – PPO

## 2022-06-05 ENCOUNTER — Other Ambulatory Visit: Payer: Self-pay

## 2022-06-05 VITALS — BP 132/91 | HR 74 | Temp 99.1°F | Resp 16

## 2022-06-05 DIAGNOSIS — Z170 Estrogen receptor positive status [ER+]: Secondary | ICD-10-CM

## 2022-06-05 DIAGNOSIS — C50211 Malignant neoplasm of upper-inner quadrant of right female breast: Secondary | ICD-10-CM | POA: Diagnosis not present

## 2022-06-05 MED ORDER — FILGRASTIM-SNDZ 300 MCG/0.5ML IJ SOSY
300.0000 ug | PREFILLED_SYRINGE | Freq: Once | INTRAMUSCULAR | Status: AC
  Administered 2022-06-05: 300 ug via SUBCUTANEOUS
  Filled 2022-06-05: qty 0.5

## 2022-06-05 NOTE — Patient Instructions (Signed)
Filgrastim Injection What is this medication? FILGRASTIM (fil GRA stim) lowers the risk of infection in people who are receiving chemotherapy. It works by helping your body make more white blood cells, which protects your body from infection. It may also be used to help people who have been exposed to high doses of radiation. It can be used to help prepare your body before a stem cell transplant. It works by helping your bone marrow make and release stem cells into the blood. This medicine may be used for other purposes; ask your health care provider or pharmacist if you have questions. COMMON BRAND NAME(S): Neupogen, Nivestym, Releuko, Zarxio What should I tell my care team before I take this medication? They need to know if you have any of these conditions: History of blood diseases, such as sickle cell anemia Kidney disease Recent or ongoing radiation An unusual or allergic reaction to filgrastim, pegfilgrastim, latex, rubber, other medications, foods, dyes, or preservatives Pregnant or trying to get pregnant Breast-feeding How should I use this medication? This medication is injected under the skin or into a vein. It is usually given by your care team in a hospital or clinic setting. It may be given at home. If you get this medication at home, you will be taught how to prepare and give it. Use exactly as directed. Take it as directed on the prescription label at the same time every day. Keep taking it unless your care team tells you to stop. It is important that you put your used needles and syringes in a special sharps container. Do not put them in a trash can. If you do not have a sharps container, call your pharmacist or care team to get one. This medication comes with INSTRUCTIONS FOR USE. Ask your pharmacist for directions on how to use this medication. Read the information carefully. Talk to your pharmacist or care team if you have questions. Talk to your care team about the use of this  medication in children. While it may be prescribed for children for selected conditions, precautions do apply. Overdosage: If you think you have taken too much of this medicine contact a poison control center or emergency room at once. NOTE: This medicine is only for you. Do not share this medicine with others. What if I miss a dose? It is important not to miss any doses. Talk to your care team about what to do if you miss a dose. What may interact with this medication? Medications that may cause a release of neutrophils, such as lithium This list may not describe all possible interactions. Give your health care provider a list of all the medicines, herbs, non-prescription drugs, or dietary supplements you use. Also tell them if you smoke, drink alcohol, or use illegal drugs. Some items may interact with your medicine. What should I watch for while using this medication? Your condition will be monitored carefully while you are receiving this medication. You may need bloodwork while taking this medication. Talk to your care team about your risk of cancer. You may be more at risk for certain types of cancer if you take this medication. What side effects may I notice from receiving this medication? Side effects that you should report to your care team as soon as possible: Allergic reactions--skin rash, itching, hives, swelling of the face, lips, tongue, or throat Capillary leak syndrome--stomach or muscle pain, unusual weakness or fatigue, feeling faint or lightheaded, decrease in the amount of urine, swelling of the ankles, hands, or   feet, trouble breathing High white blood cell level--fever, fatigue, trouble breathing, night sweats, change in vision, weight loss Inflammation of the aorta--fever, fatigue, back, chest, or stomach pain, severe headache Kidney injury (glomerulonephritis)--decrease in the amount of urine, red or dark brown urine, foamy or bubbly urine, swelling of the ankles, hands, or  feet Shortness of breath or trouble breathing Spleen injury--pain in upper left stomach or shoulder Unusual bruising or bleeding Side effects that usually do not require medical attention (report to your care team if they continue or are bothersome): Back pain Bone pain Fatigue Fever Headache Nausea This list may not describe all possible side effects. Call your doctor for medical advice about side effects. You may report side effects to FDA at 1-800-FDA-1088. Where should I keep my medication? Keep out of the reach of children and pets. Keep this medication in the original packaging until you are ready to take it. Protect from light. See product for storage information. Each product may have different instructions. Get rid of any unused medication after the expiration date. To get rid of medications that are no longer needed or have expired: Take the medication to a medications take-back program. Check with your pharmacy or law enforcement to find a location. If you cannot return the medication, ask your pharmacist or care team how to get rid of this medication safely. NOTE: This sheet is a summary. It may not cover all possible information. If you have questions about this medicine, talk to your doctor, pharmacist, or health care provider.  2023 Elsevier/Gold Standard (2021-05-07 00:00:00)  

## 2022-06-06 ENCOUNTER — Inpatient Hospital Stay: Payer: BC Managed Care – PPO

## 2022-06-06 VITALS — BP 114/79 | HR 65 | Temp 98.8°F | Resp 16

## 2022-06-06 DIAGNOSIS — C50211 Malignant neoplasm of upper-inner quadrant of right female breast: Secondary | ICD-10-CM

## 2022-06-06 MED ORDER — FILGRASTIM-SNDZ 300 MCG/0.5ML IJ SOSY
300.0000 ug | PREFILLED_SYRINGE | Freq: Once | INTRAMUSCULAR | Status: AC
  Administered 2022-06-06: 300 ug via SUBCUTANEOUS
  Filled 2022-06-06: qty 0.5

## 2022-06-07 ENCOUNTER — Inpatient Hospital Stay: Payer: BC Managed Care – PPO

## 2022-06-07 VITALS — BP 115/77 | HR 73 | Temp 98.3°F | Resp 20

## 2022-06-07 DIAGNOSIS — Z170 Estrogen receptor positive status [ER+]: Secondary | ICD-10-CM

## 2022-06-07 DIAGNOSIS — C50211 Malignant neoplasm of upper-inner quadrant of right female breast: Secondary | ICD-10-CM | POA: Diagnosis not present

## 2022-06-07 MED ORDER — FILGRASTIM-SNDZ 300 MCG/0.5ML IJ SOSY
300.0000 ug | PREFILLED_SYRINGE | Freq: Once | INTRAMUSCULAR | Status: AC
  Administered 2022-06-07: 300 ug via SUBCUTANEOUS

## 2022-06-11 ENCOUNTER — Telehealth: Payer: Self-pay | Admitting: *Deleted

## 2022-06-11 ENCOUNTER — Inpatient Hospital Stay: Payer: BC Managed Care – PPO | Attending: Hematology and Oncology

## 2022-06-11 DIAGNOSIS — Z808 Family history of malignant neoplasm of other organs or systems: Secondary | ICD-10-CM | POA: Diagnosis not present

## 2022-06-11 DIAGNOSIS — Z87891 Personal history of nicotine dependence: Secondary | ICD-10-CM | POA: Insufficient documentation

## 2022-06-11 DIAGNOSIS — Z5111 Encounter for antineoplastic chemotherapy: Secondary | ICD-10-CM | POA: Diagnosis present

## 2022-06-11 DIAGNOSIS — Z803 Family history of malignant neoplasm of breast: Secondary | ICD-10-CM | POA: Insufficient documentation

## 2022-06-11 DIAGNOSIS — Z1152 Encounter for screening for COVID-19: Secondary | ICD-10-CM | POA: Insufficient documentation

## 2022-06-11 DIAGNOSIS — R21 Rash and other nonspecific skin eruption: Secondary | ICD-10-CM | POA: Insufficient documentation

## 2022-06-11 DIAGNOSIS — Z95828 Presence of other vascular implants and grafts: Secondary | ICD-10-CM

## 2022-06-11 DIAGNOSIS — Z170 Estrogen receptor positive status [ER+]: Secondary | ICD-10-CM | POA: Diagnosis not present

## 2022-06-11 DIAGNOSIS — C50211 Malignant neoplasm of upper-inner quadrant of right female breast: Secondary | ICD-10-CM

## 2022-06-11 LAB — CMP (CANCER CENTER ONLY)
ALT: 32 U/L (ref 0–44)
AST: 25 U/L (ref 15–41)
Albumin: 3.9 g/dL (ref 3.5–5.0)
Alkaline Phosphatase: 56 U/L (ref 38–126)
Anion gap: 6 (ref 5–15)
BUN: 9 mg/dL (ref 6–20)
CO2: 25 mmol/L (ref 22–32)
Calcium: 8.7 mg/dL — ABNORMAL LOW (ref 8.9–10.3)
Chloride: 107 mmol/L (ref 98–111)
Creatinine: 0.57 mg/dL (ref 0.44–1.00)
GFR, Estimated: >60 mL/min (ref >60)
Glucose, Bld: 115 mg/dL — ABNORMAL HIGH (ref 70–99)
Potassium: 4 mmol/L (ref 3.5–5.1)
Sodium: 138 mmol/L (ref 135–145)
Total Bilirubin: 0.3 mg/dL (ref 0.3–1.2)
Total Protein: 6.1 g/dL — ABNORMAL LOW (ref 6.5–8.1)

## 2022-06-11 LAB — CBC WITH DIFFERENTIAL (CANCER CENTER ONLY)
Abs Immature Granulocytes: 0.5 10*3/uL — ABNORMAL HIGH (ref 0.00–0.07)
Basophils Absolute: 0 10*3/uL (ref 0.0–0.1)
Basophils Relative: 1 %
Eosinophils Absolute: 0.1 10*3/uL (ref 0.0–0.5)
Eosinophils Relative: 1 %
HCT: 35.3 % — ABNORMAL LOW (ref 36.0–46.0)
Hemoglobin: 12.3 g/dL (ref 12.0–15.0)
Immature Granulocytes: 9 %
Lymphocytes Relative: 28 %
Lymphs Abs: 1.7 10*3/uL (ref 0.7–4.0)
MCH: 32.5 pg (ref 26.0–34.0)
MCHC: 34.8 g/dL (ref 30.0–36.0)
MCV: 93.1 fL (ref 80.0–100.0)
Monocytes Absolute: 0.5 10*3/uL (ref 0.1–1.0)
Monocytes Relative: 9 %
Neutro Abs: 3.1 10*3/uL (ref 1.7–7.7)
Neutrophils Relative %: 52 %
Platelet Count: 199 10*3/uL (ref 150–400)
RBC: 3.79 MIL/uL — ABNORMAL LOW (ref 3.87–5.11)
RDW: 13.2 % (ref 11.5–15.5)
Smear Review: NORMAL
WBC Count: 5.8 10*3/uL (ref 4.0–10.5)
nRBC: 0 % (ref 0.0–0.2)

## 2022-06-11 LAB — PREGNANCY, URINE: Preg Test, Ur: NEGATIVE

## 2022-06-11 MED ORDER — SODIUM CHLORIDE 0.9% FLUSH
10.0000 mL | Freq: Once | INTRAVENOUS | Status: AC
  Administered 2022-06-11: 10 mL

## 2022-06-11 MED ORDER — HEPARIN SOD (PORK) LOCK FLUSH 100 UNIT/ML IV SOLN
500.0000 [IU] | Freq: Once | INTRAVENOUS | Status: AC
  Administered 2022-06-11: 500 [IU]

## 2022-06-11 MED FILL — Dexamethasone Sodium Phosphate Inj 100 MG/10ML: INTRAMUSCULAR | Qty: 1 | Status: AC

## 2022-06-11 NOTE — Telephone Encounter (Signed)
Pt called at approx 2 pm stating she has noted a rash on both her hands- now starting to go up her arms.  Rash is bumpy and itching.  Per discussion she denies any contact with new cleaners- creams.  She denies lengthy exposure to hot water ( like washing dishes) .  Per discussion with MD- pt to use topical benadryl with hydrocortisone and may take benadryl orally.  Note pt is scheduled for visit and infusion tomorrow with steroid part of her treatment plan.  Above discussed with pt.

## 2022-06-12 ENCOUNTER — Inpatient Hospital Stay: Payer: BC Managed Care – PPO

## 2022-06-12 ENCOUNTER — Telehealth: Payer: Self-pay

## 2022-06-12 ENCOUNTER — Ambulatory Visit: Payer: BC Managed Care – PPO

## 2022-06-12 ENCOUNTER — Encounter: Payer: Self-pay | Admitting: Adult Health

## 2022-06-12 ENCOUNTER — Inpatient Hospital Stay (HOSPITAL_BASED_OUTPATIENT_CLINIC_OR_DEPARTMENT_OTHER): Payer: BC Managed Care – PPO | Admitting: Adult Health

## 2022-06-12 ENCOUNTER — Other Ambulatory Visit: Payer: Self-pay

## 2022-06-12 VITALS — BP 128/94 | HR 69 | Resp 18

## 2022-06-12 DIAGNOSIS — Z170 Estrogen receptor positive status [ER+]: Secondary | ICD-10-CM | POA: Diagnosis not present

## 2022-06-12 DIAGNOSIS — M549 Dorsalgia, unspecified: Secondary | ICD-10-CM

## 2022-06-12 DIAGNOSIS — C50211 Malignant neoplasm of upper-inner quadrant of right female breast: Secondary | ICD-10-CM | POA: Diagnosis not present

## 2022-06-12 LAB — URINALYSIS, COMPLETE (UACMP) WITH MICROSCOPIC
Bilirubin Urine: NEGATIVE
Glucose, UA: NEGATIVE mg/dL
Hgb urine dipstick: NEGATIVE
Ketones, ur: NEGATIVE mg/dL
Leukocytes,Ua: NEGATIVE
Nitrite: NEGATIVE
Protein, ur: NEGATIVE mg/dL
Specific Gravity, Urine: 1.004 — ABNORMAL LOW (ref 1.005–1.030)
pH: 5 (ref 5.0–8.0)

## 2022-06-12 MED ORDER — SODIUM CHLORIDE 0.9 % IV SOLN
10.0000 mg | Freq: Once | INTRAVENOUS | Status: AC
  Administered 2022-06-12: 10 mg via INTRAVENOUS
  Filled 2022-06-12: qty 10

## 2022-06-12 MED ORDER — SODIUM CHLORIDE 0.9 % IV SOLN
200.0000 mg | Freq: Once | INTRAVENOUS | Status: AC
  Administered 2022-06-12: 200 mg via INTRAVENOUS
  Filled 2022-06-12: qty 200

## 2022-06-12 MED ORDER — SODIUM CHLORIDE 0.9% FLUSH
10.0000 mL | INTRAVENOUS | Status: DC | PRN
  Administered 2022-06-12: 10 mL

## 2022-06-12 MED ORDER — SODIUM CHLORIDE 0.9 % IV SOLN
169.8000 mg | Freq: Once | INTRAVENOUS | Status: AC
  Administered 2022-06-12: 170 mg via INTRAVENOUS
  Filled 2022-06-12: qty 17

## 2022-06-12 MED ORDER — SODIUM CHLORIDE 0.9 % IV SOLN
80.0000 mg/m2 | Freq: Once | INTRAVENOUS | Status: AC
  Administered 2022-06-12: 132 mg via INTRAVENOUS
  Filled 2022-06-12: qty 22

## 2022-06-12 MED ORDER — SODIUM CHLORIDE 0.9 % IV SOLN: Freq: Once | INTRAVENOUS | Status: AC

## 2022-06-12 MED ORDER — FAMOTIDINE 20 MG IN NS 100 ML IVPB
20.0000 mg | Freq: Once | INTRAVENOUS | Status: AC
  Administered 2022-06-12: 20 mg via INTRAVENOUS
  Filled 2022-06-12: qty 20

## 2022-06-12 MED ORDER — PALONOSETRON HCL INJECTION 0.25 MG/5ML
0.2500 mg | Freq: Once | INTRAVENOUS | Status: AC
  Administered 2022-06-12: 0.25 mg via INTRAVENOUS
  Filled 2022-06-12: qty 5

## 2022-06-12 MED ORDER — HEPARIN SOD (PORK) LOCK FLUSH 100 UNIT/ML IV SOLN
500.0000 [IU] | Freq: Once | INTRAVENOUS | Status: AC | PRN
  Administered 2022-06-12: 500 [IU]

## 2022-06-12 MED ORDER — DIPHENHYDRAMINE HCL 50 MG/ML IJ SOLN
50.0000 mg | Freq: Once | INTRAMUSCULAR | Status: AC
  Administered 2022-06-12: 50 mg via INTRAVENOUS
  Filled 2022-06-12: qty 1

## 2022-06-12 MED ORDER — FAMOTIDINE 20 MG IN NS 100 ML IVPB: 20.0000 mg | Freq: Once | INTRAVENOUS | Status: DC

## 2022-06-12 NOTE — Telephone Encounter (Signed)
Called and LVM with below message. Gave call back number with any additional questions.

## 2022-06-12 NOTE — Progress Notes (Signed)
[facility] Cancer Follow up:    Vanessa Panda, NP [address] [place] [address]   DIAGNOSIS:  Cancer Staging  Malignant neoplasm of upper-inner quadrant of right breast in female, estrogen receptor positive (HCC) Staging form: Breast, AJCC 8th Edition - Clinical stage from 05/07/2022: Stage IIB (cT2, cN0, cM0, G3, ER+, PR-, HER2-) - Unsigned Stage prefix: Initial diagnosis Histologic grading system: 3 grade system Laterality: Right Staged by: Pathologist and managing physician Stage used in treatment planning: Yes National guidelines used in treatment planning: Yes Type of national guideline used in treatment planning: NCCN   SUMMARY OF ONCOLOGIC HISTORY: Oncology History  Malignant 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   04/25/2022 Pathology Results   Right breast needle core biopsy at 3:00 showed high-grade invasive ductal carcinoma.  Prognostic showed ER 40% positive weak staining intensity, PR 0% negative, group 5 HER2 negative and Ki-67 of 80%.   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 Report date is 05/14/2022.  The Custom Hereditary Cancers  Panel offered by [facility] includes sequencing and/or deletion duplication testing of the following 43 genes: APC, ATM, AXIN2, BAP1, BARD1, BMPR1A, BRCA1, BRCA2, BRIP1, CDH1, CDK4, CDKN2A (p14ARF and p16INK4a only), CHEK2, CTNNA1, EPCAM (Deletion/duplication testing only), FH, GREM1 (promoter region duplication testing only), HOXB13, KIT, MBD4, MEN1, MLH1, MSH2, MSH3, MSH6, MUTYH, NF1, NHTL1, PALB2, PDGFRA, PMS2, POLD1, POLE, PTEN, RAD51C, RAD51D, SMAD4, SMARCA4. STK11, TP53, TSC1, TSC2, and VHL.     CURRENT THERAPY:  Taxol/Carbo/Keytruda  INTERVAL HISTORY: Vanessa Murphy 42 y.o. female returns for f/u of her breast cancer on treatment with Taxol/Carbo/Keytruda.  She is doing well today.  Her biggest issue is a rash on her arms and abdomen.  She notes that this started yesterday.  She did receive growth factor injection this past week.  Has not had a rash like this previously after her treatments.  She notes after receiving the growth factor injection she also had some lower hip and abdominal pain and this resolved with tramadol.   Patient Active Problem List   Diagnosis Date Noted   Port-A-Cath in place 05/22/2022   Genetic testing 05/15/2022   Family history of breast cancer 05/07/2022   Family history of melanoma 05/07/2022   Malignant neoplasm of upper-inner quadrant of right breast in female, estrogen receptor positive (HCC) 05/06/2022    is allergic to contrast media [iodinated contrast media], amitriptyline, and iodine.  MEDICAL HISTORY: Past Medical History:  Diagnosis Date   Migraines     SURGICAL HISTORY: Past Surgical History:  Procedure Laterality Date   BREAST BIOPSY Right 04/25/2022   [place] RT BREAST BX W LOC DEV 1ST LESION  IMG BX SPEC US GUIDE 04/25/2022 [facility] MAMMO[facility]APHY   IR IMAGING GUIDED PORT INSERTION  05/21/2022    SOCIAL HISTORY: Social History   Socioeconomic History   Marital status: Single    Spouse name: Not on file   Number of children: Not on file    Years of education: Not on file   Highest education level: Not on file  Occupational History   Not on file  Tobacco Use   Smoking status: Former    Packs/day: .5    Types: Cigarettes    Quit date: 04/29/2022    Years since quitting: 0.1   Smokeless tobacco: Never  Substance and Sexual Activity   Alcohol use: Yes   Drug use: Never   Sexual activity: Yes    Birth control/protection: Surgical    Comment: MRI 05-16-22 Left Breast Lump found  Other Topics Concern   Not on file  Social History Narrative   Not on file   Social Determinants of Health   Financial Resource Strain: Medium Risk (05/07/2022)   Overall Financial Resource Strain (CARDIA)    Difficulty of Paying Living Expenses: Somewhat hard  Food Insecurity: No Food Insecurity (05/07/2022)   Hunger Vital Sign    Worried About Running Out of Food in the Last Year: Never true    Ran Out of Food in the Last Year: Never true  Transportation Needs: No Transportation Needs (05/07/2022)   PRAPARE - Administrator, Civil Service (Medical): No    Lack of Transportation (Non-Medical): No  Physical Activity: Not on file  Stress: Stress Concern Present (05/07/2022)   [employer] of Occupational Health - Occupational Stress Questionnaire    Feeling of Stress : Very much  Social Connections: Not on file  Intimate Partner Violence: Not on file    FAMILY HISTORY: Family History  Problem Relation Age of Onset   Melanoma Father 63 - 11       dx. again in his early 39s   Breast cancer Paternal Grandmother 81 - 50    Review of Systems  Constitutional:  Positive for fatigue. Negative for appetite change, chills, fever and unexpected weight change.  HENT:   Negative for hearing loss, lump/mass and trouble swallowing.   Eyes:  Negative for eye problems and icterus.  Respiratory:  Negative for chest tightness, cough and shortness of breath.   Cardiovascular:  Negative for chest pain, leg swelling and palpitations.   Gastrointestinal:  Negative for abdominal distention, abdominal pain, constipation, diarrhea, nausea and vomiting.  Endocrine: Negative for hot flashes.  Genitourinary:  Negative for difficulty urinating.   Musculoskeletal:  Negative for arthralgias.  Skin:  Positive for rash. Negative for itching.  Neurological:  Negative for dizziness, extremity weakness, headaches and numbness.  Hematological:  Negative for adenopathy. Does not bruise/bleed easily.  Psychiatric/Behavioral:  Negative for depression. The patient is not nervous/anxious.       PHYSICAL EXAMINATION   Onc Performance Status - 06/12/22 0900       ECOG Perf Status   ECOG Perf Status Fully active, able to carry on all pre-disease performance without restriction      KPS SCALE   KPS % SCORE Normal, no compliants, no evidence of disease             Vitals:   06/12/22 0857  BP: (!) 121/90  Pulse: 74  Resp: 18  Temp: 97.7 F (36.5 C)  SpO2: 99%    Physical Exam Constitutional:  General: She is not in acute distress.    Appearance: Normal appearance. She is not toxic-appearing.  HENT:     Head: Normocephalic and atraumatic.  Eyes:     General: No scleral icterus. Cardiovascular:     Rate and Rhythm: Normal rate and regular rhythm.     Pulses: Normal pulses.     Heart sounds: Normal heart sounds.  Pulmonary:     Effort: Pulmonary effort is normal.     Breath sounds: Normal breath sounds.  Abdominal:     General: Abdomen is flat. Bowel sounds are normal. There is no distension.     Palpations: Abdomen is soft.     Tenderness: There is no abdominal tenderness.  Musculoskeletal:        General: No swelling.     Cervical back: Neck supple.  Lymphadenopathy:     Cervical: No cervical adenopathy.  Skin:    General: Skin is warm and dry.     Findings: Rash (diffuse erythema noted on abdomen and arms.  no papular areas) present.  Neurological:     General: No focal deficit present.     Mental  Status: She is alert.  Psychiatric:        Mood and Affect: Mood normal.        Behavior: Behavior normal.     LABORATORY DATA:  CBC    Component Value Date/Time   WBC 5.8 06/11/2022 0816   RBC 3.79 (L) 06/11/2022 0816   HGB 12.3 06/11/2022 0816   HCT 35.3 (L) 06/11/2022 0816   PLT 199 06/11/2022 0816   MCV 93.1 06/11/2022 0816   MCH 32.5 06/11/2022 0816   MCHC 34.8 06/11/2022 0816   RDW 13.2 06/11/2022 0816   LYMPHSABS 1.7 06/11/2022 0816   MONOABS 0.5 06/11/2022 0816   EOSABS 0.1 06/11/2022 0816   BASOSABS 0.0 06/11/2022 0816    CMP     Component Value Date/Time   NA 138 06/11/2022 0816   K 4.0 06/11/2022 0816   CL 107 06/11/2022 0816   CO2 25 06/11/2022 0816   GLUCOSE 115 (H) 06/11/2022 0816   BUN 9 06/11/2022 0816   CREATININE 0.57 06/11/2022 0816   CALCIUM 8.7 (L) 06/11/2022 0816   PROT 6.1 (L) 06/11/2022 0816   ALBUMIN 3.9 06/11/2022 0816   AST 25 06/11/2022 0816   ALT 32 06/11/2022 0816   ALKPHOS 56 06/11/2022 0816   BILITOT 0.3 06/11/2022 0816   GFRNONAA >60 06/11/2022 0816       ASSESSMENT and THERAPY PLAN:   Malignant neoplasm of upper-inner quadrant of right breast in female, estrogen receptor positive (HCC) Vanessa Murphy is a 42 year old woman with stage IIb right-sided breast cancer here today for follow-up and evaluation prior to receiving neoadjuvant Keytruda, Taxol, carbo.  Stage IIb breast cancer: Her labs are stable today.  She will proceed with treatment. Skin rash: This is likely related to the G-CSF injection.  We will see how it responds to the IV Benadryl steroid she received today and over the next 2 days.  I am happy to send her in a Medrol Dosepak if needed. Fatigue: She is managing this with energy conservation and is able to continue to work.  Vanessa Murphy will return next week for labs, follow-up, and her next treatment.    All questions were answered. The patient knows to call the clinic with any problems, questions or concerns. We can  certainly see the patient much sooner if necessary.  Total encounter time:30 minutes*in face-to-face visit  time, chart review, lab review, care coordination, order entry, and documentation of the encounter time.    Lillard Anes, NP 06/12/22 9:35 AM Medical Oncology and Hematology [facility] [address] [place], [place] [address] Tel. [phone]    Fax. [phone]  *Total Encounter Time as defined by the Centers for Medicare and Medicaid Services includes, in addition to the face-to-face time of a patient visit (documented in the note above) non-face-to-face time: obtaining and reviewing outside history, ordering and reviewing medications, tests or procedures, care coordination (communications with other health care professionals or caregivers) and documentation in the medical record.

## 2022-06-12 NOTE — Telephone Encounter (Signed)
-----   Message from Loa Socks, NP sent at 06/12/2022  3:12 PM EDT ----- Urine negative, please let patient know likely injection leading to her pain  ----- Message ----- From: Interface, Lab In [place] Sent: 06/12/2022   2:07 PM EDT To: Loa Socks, NP

## 2022-06-12 NOTE — Assessment & Plan Note (Signed)
Vanessa Murphy is a 42 year old woman with stage IIb right-sided breast cancer here today for follow-up and evaluation prior to receiving neoadjuvant Keytruda, Taxol, carbo.  Stage IIb breast cancer: Her labs are stable today.  She will proceed with treatment. Skin rash: This is likely related to the G-CSF injection.  We will see how it responds to the IV Benadryl steroid she received today and over the next 2 days.  I am happy to send her in a Medrol Dosepak if needed. Fatigue: She is managing this with energy conservation and is able to continue to work.  Destiny will return next week for labs, follow-up, and her next treatment.

## 2022-06-12 NOTE — Patient Instructions (Signed)
[facility]  Discharge Instructions: Thank you for choosing [facility] to provide your oncology and hematology care.   If you have a lab appointment with the Cancer Center, please go directly to the Cancer Center and check in at the registration area.   Wear comfortable clothing and clothing appropriate for easy access to any Portacath or PICC line.   We strive to give you quality time with your provider. You may need to reschedule your appointment if you arrive late (15 or more minutes).  Arriving late affects you and other patients whose appointments are after yours.  Also, if you miss three or more appointments without notifying the office, you may be dismissed from the clinic at the provider's discretion.      For prescription refill requests, have your pharmacy contact our office and allow 72 hours for refills to be completed.    Today you received the following chemotherapy and/or immunotherapy agents: Keytruda, Paclitaxel, Carboplatin.       To help prevent nausea and vomiting after your treatment, we encourage you to take your nausea medication as directed.  BELOW ARE SYMPTOMS THAT SHOULD BE REPORTED IMMEDIATELY: *FEVER GREATER THAN 100.4 F (38 C) OR HIGHER *CHILLS OR SWEATING *NAUSEA AND VOMITING THAT IS NOT CONTROLLED WITH YOUR NAUSEA MEDICATION *UNUSUAL SHORTNESS OF BREATH *UNUSUAL BRUISING OR BLEEDING *URINARY PROBLEMS (pain or burning when urinating, or frequent urination) *BOWEL PROBLEMS (unusual diarrhea, constipation, pain near the anus) TENDERNESS IN MOUTH AND THROAT WITH OR WITHOUT PRESENCE OF ULCERS (sore throat, sores in mouth, or a toothache) UNUSUAL RASH, SWELLING OR PAIN  UNUSUAL VAGINAL DISCHARGE OR ITCHING   Items with * indicate a potential emergency and should be followed up as soon as possible or go to the Emergency Department if any problems should occur.  Please show the CHEMOTHERAPY ALERT CARD or  IMMUNOTHERAPY ALERT CARD at check-in to the Emergency Department and triage nurse.  Should you have questions after your visit or need to cancel or reschedule your appointment, please contact [facility]  Dept: [phone]  and follow the prompts.  Office hours are 8:00 a.m. to 4:30 p.m. Monday - Friday. Please note that voicemails left after 4:00 p.m. may not be returned until the following business day.  We are closed weekends and major holidays. You have access to a nurse at all times for urgent questions. Please call the main number to the clinic Dept: [phone] and follow the prompts.   For any non-urgent questions, you may also contact your provider using MyChart. We now offer e-Visits for anyone 18 and older to request care online for non-urgent symptoms. For details visit [email or web address].   Also download the MyChart app! Go to the app store, search "MyChart", open the app, select [facility], and log in with your MyChart username and password.   

## 2022-06-13 ENCOUNTER — Other Ambulatory Visit (HOSPITAL_COMMUNITY): Payer: Self-pay

## 2022-06-13 ENCOUNTER — Telehealth: Payer: Self-pay | Admitting: *Deleted

## 2022-06-13 ENCOUNTER — Other Ambulatory Visit: Payer: Self-pay

## 2022-06-13 ENCOUNTER — Inpatient Hospital Stay (HOSPITAL_BASED_OUTPATIENT_CLINIC_OR_DEPARTMENT_OTHER): Payer: BC Managed Care – PPO | Admitting: Hematology and Oncology

## 2022-06-13 ENCOUNTER — Encounter: Payer: Self-pay | Admitting: Hematology and Oncology

## 2022-06-13 VITALS — BP 137/88 | HR 83 | Temp 98.1°F | Resp 14 | Wt 137.1 lb

## 2022-06-13 DIAGNOSIS — Z170 Estrogen receptor positive status [ER+]: Secondary | ICD-10-CM

## 2022-06-13 DIAGNOSIS — C50211 Malignant neoplasm of upper-inner quadrant of right female breast: Secondary | ICD-10-CM | POA: Diagnosis not present

## 2022-06-13 LAB — URINE CULTURE

## 2022-06-13 MED ORDER — TRIAMCINOLONE ACETONIDE 0.1 % EX LOTN
1.0000 | TOPICAL_LOTION | Freq: Two times a day (BID) | CUTANEOUS | 1 refills | Status: DC
  Filled 2022-06-13: qty 60, 30d supply, fill #0

## 2022-06-13 NOTE — Assessment & Plan Note (Signed)
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  With tumor size larger than 2 cm, we have discussed about considering neoadjuvant chemotherapy based on keynote 522 study.  Other option would be to do neoadjuvant non anthracycline-based regimen since she has node-negative disease.   Given high-grade, high proliferation index and weak staining of estrogen receptor, I do believe this is likely to behave like a triple negative breast cancer. 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  We did discuss that if she is uncomfortable with the option to treated like a triple negative breast cancer we can certainly try to omit immunotherapy and adjust chemotherapy regimen.    She is here for an interim skin rash that started after the cycle 2 of chemotherapy.  Rash is now on distal extremities and some portion of the trunk associated with some itching and heat.  No fevers or chills.  We have discussed that since she is on dexamethasone today and tomorrow for post chemo nausea and vomiting, she will start Medrol Dosepak on Sunday.  She can also try some topical Kenalog to help with the itching.  This is more than likely immunotherapy related rash.  Will continue to monitor the pruritus.  She was instructed to call us on Monday to give [place] an update.

## 2022-06-13 NOTE — Telephone Encounter (Addendum)
This RN spoke with pt several times this AM due to recurrence and worsening of rash on her arms and abd.  She states onset on the past Wednesday - came in yesterday and was seen by NPD- proceeded with treatment which included IV decadron and benadryl.  Rash subsided post above administration but then recurred late pm yesterday- pt took benadryl with some benefit because "it made me fall asleep"  This AM she states rash is now "just getting worse" she has taken her am dose of post chemo steroids with no noted benefit presently.  Vanessa Murphy is currently at work - approximately 25 minutes away.  She denies any SOB or throat changes.  Per MD prescriptions given for urgent use - with this RN contacting the pharmacy per request to start doxycycline and medrol dose pak.  Pt informed of all the above with pt in agreement

## 2022-06-13 NOTE — Progress Notes (Signed)
[facility] Cancer Follow up:    Vanessa Panda, NP [address] [place] [address]   DIAGNOSIS:  Cancer Staging  Malignant neoplasm of upper-inner quadrant of right breast in female, estrogen receptor positive (HCC) Staging form: Breast, AJCC 8th Edition - Clinical stage from 05/07/2022: Stage IIB (cT2, cN0, cM0, G3, ER+, PR-, HER2-) - Unsigned Stage prefix: Initial diagnosis Histologic grading system: 3 grade system Laterality: Right Staged by: Pathologist and managing physician Stage used in treatment planning: Yes National guidelines used in treatment planning: Yes Type of national guideline used in treatment planning: NCCN   SUMMARY OF ONCOLOGIC HISTORY: Oncology History  Malignant 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   04/25/2022 Pathology Results   Right breast needle core biopsy at 3:00 showed high-grade invasive ductal carcinoma.  Prognostic showed ER 40% positive weak staining intensity, PR 0% negative, group 5 HER2 negative and Ki-67 of 80%.   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 Report date is 05/14/2022.  The Custom Hereditary Cancers  Panel offered by [facility] includes sequencing and/or deletion duplication testing of the following 43 genes: APC, ATM, AXIN2, BAP1, BARD1, BMPR1A, BRCA1, BRCA2, BRIP1, CDH1, CDK4, CDKN2A (p14ARF and p16INK4a only), CHEK2, CTNNA1, EPCAM (Deletion/duplication testing only), FH, GREM1 (promoter region duplication testing only), HOXB13, KIT, MBD4, MEN1, MLH1, MSH2, MSH3, MSH6, MUTYH, NF1, NHTL1, PALB2, PDGFRA, PMS2, POLD1, POLE, PTEN, RAD51C, RAD51D, SMAD4, SMARCA4. STK11, TP53, TSC1, TSC2, and VHL.     CURRENT THERAPY:  Taxol/Carbo/Keytruda  INTERVAL HISTORY: Vanessa Murphy 42 y.o. female returns for f/u of her breast cancer on treatment with Taxol/Carbo/Keytruda.  She is here to follow up on rash. She was seen by Mardella Layman yesterday but called back that the rash is getting worse.  She mostly complains of some itching which is keeping her awake.  She is on oral Dex today and tomorrow for post chemo nausea.  She tried some Benadryl which did not help with the itching.  Rash covers distal extremities and some portion of the trunk.  Its mostly macular with some papular rash.  No fevers or chills.  She has some night sweats.  Had mild nausea today for which she took some Compazine.  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  MEDICAL HISTORY: Past Medical History:  Diagnosis Date   Migraines     SURGICAL HISTORY: Past Surgical History:  Procedure Laterality Date   BREAST BIOPSY Right 04/25/2022   [place] RT BREAST BX W LOC DEV 1ST LESION IMG BX SPEC US GUIDE 04/25/2022 [facility]  MAMMOGRAPHY   IR IMAGING GUIDED PORT INSERTION  05/21/2022    SOCIAL HISTORY: Social History   Socioeconomic History   Marital status: Single    Spouse name: Not on file   Number of children: Not on file   Years of education: Not on file   Highest education level: Not on file  Occupational History   Not on file  Tobacco Use   Smoking status: Former    Packs/day: .5    Types: Cigarettes    Quit date: 04/29/2022    Years since quitting: 0.1   Smokeless tobacco: Never  Substance and Sexual Activity   Alcohol use: Yes   Drug use: Never   Sexual activity: Yes    Birth control/protection: Surgical    Comment: MRI 05-16-22 Left Breast Lump found  Other Topics Concern   Not on file  Social History Narrative   Not on file   Social Determinants of Health   Financial Resource Strain: Medium Risk (05/07/2022)   Overall Financial Resource Strain (CARDIA)    Difficulty of Paying Living Expenses: Somewhat hard  Food Insecurity: No Food Insecurity (05/07/2022)   Hunger Vital Sign    Worried About Running Out of Food in the Last Year: Never true    Ran Out of Food in the Last Year: Never true  Transportation Needs: No Transportation Needs (05/07/2022)   PRAPARE - Administrator, Civil Service (Medical): No    Lack of Transportation (Non-Medical): No  Physical Activity: Not on file  Stress: Stress Concern Present (05/07/2022)   [employer] of Occupational Health - Occupational Stress Questionnaire    Feeling of Stress : Very much  Social Connections: Not on file  Intimate Partner Violence: Not on file    FAMILY HISTORY: Family History  Problem Relation Age of Onset   Melanoma Father 61 - 53       dx. again in his early 39s   Breast cancer Paternal Grandmother 36 - 68    Review of Systems  Constitutional:  Positive for fatigue. Negative for appetite change, chills, fever and unexpected weight change.  HENT:   Negative for hearing loss, lump/mass and trouble  swallowing.   Eyes:  Negative for eye problems and icterus.  Respiratory:  Negative for chest tightness, cough and shortness of breath.   Cardiovascular:  Negative for chest pain, leg swelling and palpitations.  Gastrointestinal:  Negative for abdominal distention, abdominal pain, constipation, diarrhea, nausea and vomiting.  Endocrine: Negative for hot flashes.  Genitourinary:  Negative for difficulty urinating.   Musculoskeletal:  Negative for arthralgias.  Skin:  Positive for rash. Negative for itching.  Neurological:  Negative for dizziness, extremity weakness, headaches and numbness.  Hematological:  Negative for adenopathy. Does not bruise/bleed easily.  Psychiatric/Behavioral:  Negative for depression. The patient is not nervous/anxious.       PHYSICAL EXAMINATION     Vitals:   06/13/22 1234  BP: 137/88  Pulse: 83  Resp: 14  Temp: 98.1 F (36.7 C)  SpO2: 99%    Physical Exam Constitutional:      General: She is not in acute distress.    Appearance: Normal appearance. She is not toxic-appearing.  HENT:     Head: Normocephalic and atraumatic.  Eyes:     General: No scleral icterus. Cardiovascular:     Rate and Rhythm: Normal rate and regular rhythm.     Pulses: Normal pulses.     Heart sounds: Normal heart sounds.  Pulmonary:     Effort: Pulmonary effort is normal.     Breath sounds: Normal breath sounds.  Abdominal:     General: Abdomen is flat. Bowel sounds are normal. There is no distension.     Palpations: Abdomen is soft.     Tenderness: There is no abdominal tenderness.  Musculoskeletal:        General: No swelling.     Cervical back: Neck supple.  Lymphadenopathy:     Cervical: No cervical adenopathy.  Skin:    General: Skin is warm and dry.     Findings: Rash (Diffuse erythema with some papular rash noted on the distal extremities and some trunk) present.  Neurological:     General: No focal deficit present.     Mental Status: She is alert.   Psychiatric:        Mood and Affect: Mood normal.        Behavior: Behavior normal.     LABORATORY DATA:  CBC    Component Value Date/Time   WBC 5.8 06/11/2022 0816   RBC 3.79 (L) 06/11/2022 0816   HGB 12.3 06/11/2022 0816   HCT 35.3 (L) 06/11/2022 0816   PLT 199 06/11/2022 0816   MCV 93.1 06/11/2022 0816   MCH 32.5 06/11/2022 0816   MCHC 34.8 06/11/2022 0816   RDW 13.2 06/11/2022 0816   LYMPHSABS 1.7 06/11/2022 0816   MONOABS 0.5 06/11/2022 0816   EOSABS 0.1 06/11/2022 0816   BASOSABS 0.0 06/11/2022 0816    CMP     Component Value Date/Time   NA 138 06/11/2022 0816   K 4.0 06/11/2022 0816   CL 107 06/11/2022 0816   CO2 25 06/11/2022 0816   GLUCOSE 115 (H) 06/11/2022 0816   BUN 9 06/11/2022 0816   CREATININE 0.57 06/11/2022 0816   CALCIUM 8.7 (L) 06/11/2022 0816   PROT 6.1 (L) 06/11/2022 0816   ALBUMIN 3.9 06/11/2022 0816   AST 25 06/11/2022 0816   ALT 32 06/11/2022 0816   ALKPHOS 56 06/11/2022 0816   BILITOT 0.3 06/11/2022 0816   GFRNONAA >60 06/11/2022 0816       ASSESSMENT and THERAPY PLAN:   Malignant neoplasm of upper-inner quadrant of right breast in female, estrogen receptor positive (HCC) This is a very pleasant 42 year old female patient with newly diagnosed right breast high-grade invasive ductal carcinoma, ER +40% weak staining, PR 0% negative, HER2 negative, Ki-67 of 80% referred to breast [facility] for additional recommendations.  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  With tumor size larger than 2 cm, we have discussed about considering neoadjuvant chemotherapy based on keynote 522 study.  Other option would be to do neoadjuvant non anthracycline-based regimen since she has node-negative disease.   Given high-grade, high proliferation index and weak staining of estrogen receptor, I do believe this is likely to behave like a triple negative breast cancer. 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  We did discuss that if she is uncomfortable with the option to treated like a triple negative breast cancer we can certainly  try to omit immunotherapy and adjust chemotherapy regimen.    She is here for an interim skin rash that started after the cycle 2 of chemotherapy.  Rash is now on distal extremities and some portion of the trunk associated with some itching and heat.  No fevers or chills.  We have discussed that since she is on dexamethasone today and tomorrow for post chemo nausea and vomiting, she will start Medrol Dosepak on Sunday.  She can also try some topical Kenalog to help with the itching.  This is more than likely immunotherapy related rash.  Will continue to monitor the pruritus.  She was instructed to call us on Monday to give [place] an update.    All questions were answered. The patient knows to call the clinic with any problems, questions or concerns. We can certainly see the patient much sooner if necessary.  Total encounter time:20 minutes*in face-to-face visit time, chart review, lab review, care coordination, order entry, and documentation of the encounter time.  *Total Encounter Time as defined by the Centers for Medicare and Medicaid Services includes, in addition to the face-to-face time of a patient visit (documented in the note above) non-face-to-face time: obtaining and reviewing outside history, ordering and reviewing medications, tests or procedures, care coordination (communications with other health care professionals or caregivers) and documentation in the medical record.

## 2022-06-14 ENCOUNTER — Ambulatory Visit (HOSPITAL_COMMUNITY)
Admission: EM | Admit: 2022-06-14 | Discharge: 2022-06-14 | Disposition: A | Discharge status: Home / Self Care | Payer: BC Managed Care – PPO

## 2022-06-14 LAB — URINE CULTURE: Culture: 50000 — AB

## 2022-06-15 ENCOUNTER — Encounter (HOSPITAL_COMMUNITY): Payer: Self-pay

## 2022-06-15 ENCOUNTER — Ambulatory Visit (HOSPITAL_COMMUNITY)
Admission: EM | Admit: 2022-06-15 | Discharge: 2022-06-15 | Disposition: A | Discharge status: Home / Self Care | Payer: BC Managed Care – PPO | Attending: Emergency Medicine | Admitting: Emergency Medicine

## 2022-06-15 VITALS — BP 122/86 | HR 78 | Temp 98.4°F | Resp 14

## 2022-06-15 DIAGNOSIS — R21 Rash and other nonspecific skin eruption: Secondary | ICD-10-CM | POA: Diagnosis present

## 2022-06-15 DIAGNOSIS — C50919 Malignant neoplasm of unspecified site of unspecified female breast: Secondary | ICD-10-CM | POA: Diagnosis present

## 2022-06-15 DIAGNOSIS — N390 Urinary tract infection, site not specified: Secondary | ICD-10-CM

## 2022-06-15 HISTORY — DX: Malignant (primary) neoplasm, unspecified: C80.1

## 2022-06-15 LAB — POCT URINALYSIS DIP (MANUAL ENTRY)
Bilirubin, UA: NEGATIVE
Blood, UA: NEGATIVE
Glucose, UA: NEGATIVE mg/dL
Leukocytes, UA: NEGATIVE
Nitrite, UA: NEGATIVE
Protein Ur, POC: NEGATIVE mg/dL
Spec Grav, UA: 1.025 (ref 1.010–1.025)
Urobilinogen, UA: 0.2 E.U./dL
pH, UA: 6 (ref 5.0–8.0)

## 2022-06-15 LAB — CBC WITH DIFFERENTIAL/PLATELET
Abs Immature Granulocytes: 0.06 10*3/uL (ref 0.00–0.07)
Basophils Absolute: 0 10*3/uL (ref 0.0–0.1)
Basophils Relative: 0 %
Eosinophils Absolute: 0 10*3/uL (ref 0.0–0.5)
Eosinophils Relative: 0 %
HCT: 38.3 % (ref 36.0–46.0)
Hemoglobin: 12.9 g/dL (ref 12.0–15.0)
Immature Granulocytes: 1 %
Lymphocytes Relative: 9 %
Lymphs Abs: 0.7 10*3/uL (ref 0.7–4.0)
MCH: 31.4 pg (ref 26.0–34.0)
MCHC: 33.7 g/dL (ref 30.0–36.0)
MCV: 93.2 fL (ref 80.0–100.0)
Monocytes Absolute: 0.4 10*3/uL (ref 0.1–1.0)
Monocytes Relative: 5 %
Neutro Abs: 6.4 10*3/uL (ref 1.7–7.7)
Neutrophils Relative %: 85 %
Platelets: 186 10*3/uL (ref 150–400)
RBC: 4.11 MIL/uL (ref 3.87–5.11)
RDW: 13.3 % (ref 11.5–15.5)
WBC: 7.5 10*3/uL (ref 4.0–10.5)
nRBC: 0 % (ref 0.0–0.2)

## 2022-06-15 MED ORDER — CEFDINIR 300 MG PO CAPS: 300.0000 mg | ORAL_CAPSULE | Freq: Two times a day (BID) | ORAL | 0 refills | Status: DC

## 2022-06-15 NOTE — ED Provider Notes (Signed)
[facility]   Note:  This document was prepared using Dragon voice recognition software and may include unintentional dictation errors.  MRN: [id number] DOB: 1980/02/15 DATE: 06/15/22   Subjective:  Chief Complaint:  Chief Complaint  Patient presents with   Urinary Frequency   Rash   Vaginal Bleeding     HPI: Vanessa Murphy is a 42 y.o. female presenting for urinary frequency and vaginal bleeding.  Patient has a history of breast cancer and is currently undergoing chemotherapy as well as being treated with Keytruda.  Patient states she has undergone 4 rounds of chemotherapy and following her last round she developed a rash on her bilateral upper extremities.  She contacted her oncologist on Friday, 06/13/2022.  Per the note, rash seems to be attributed to chemotherapy treatments.  She was started on a Medrol Dosepak today after she completed her dexamethasone treatments.  She states the rash is improving with the Medrol Dosepak.  She also discussed some urinary frequency and low back pain she was having at that time.  She had UA and urine culture performed on Friday.  At that time, she was diagnosed with a UTI due to Klebsiella.  She was placed on Doxycycline, but per sensitivity tetracyclines were not tested.  She states she continues to have some urinary frequency and low back pain.  Finally, she is concerned because her last menstrual period has been longer than normal.  She states her last period started on June 04, 2022 and she continues to have some spotting. Denies fever, nausea/vomiting, abdominal pain. Endorses rash, vaginal bleeding, increased urinary frequency, and low back pain. Presents NAD.  Prior to Admission medications   Medication Sig Start Date End Date Taking? Authorizing Provider  dexamethasone (DECADRON) 4 MG tablet Take 2 tablets daily for 2 days, start the day after chemotherapy. Take with food. 05/21/22   Rachel Moulds, MD  fluticasone (FLONASE) 50  MCG/ACT nasal spray Place 2 sprays into both nostrils daily. OTC from [employer], Historical, MD  lidocaine-prilocaine (EMLA) cream Apply to affected area once 05/21/22   Iruku, Burnice Logan, MD  ondansetron (ZOFRAN) 8 MG tablet Take 1 tablet (8 mg total) by mouth every 8 (eight) hours as needed for nausea or vomiting. Start on the third day after chemotherapy. Patient not taking: Reported on 06/12/2022 05/21/22   Rachel Moulds, MD  prochlorperazine (COMPAZINE) 10 MG tablet Take 1 tablet (10 mg total) by mouth every 6 (six) hours as needed for nausea or vomiting. Patient not taking: Reported on 06/12/2022 05/21/22   Rachel Moulds, MD  traMADol (ULTRAM) 50 MG tablet Take 1 tablet (50 mg total) by mouth every 12 (twelve) hours as needed. 06/03/22   Rachel Moulds, MD  triamcinolone lotion (KENALOG) 0.1 % Apply 1 Application topically 2 (two) times daily. 06/13/22   Rachel Moulds, MD     Allergies  Allergen Reactions   Contrast Media [Iodinated Contrast Media] Nausea And Vomiting    "Skin feeling hot"   Amitriptyline Other (See Comments)    Confusion, sleeplessness   Iodine Hives    History:   Past Medical History:  Diagnosis Date   Cancer (HCC)    Migraines      Past Surgical History:  Procedure Laterality Date   BREAST BIOPSY Right 04/25/2022   [place] RT BREAST BX W LOC DEV 1ST LESION IMG BX SPEC US GUIDE 04/25/2022 [facility]   IR IMAGING GUIDED PORT INSERTION  05/21/2022    Family History  Problem Relation  Age of Onset   Melanoma Father 54 - 75       dx. again in his early 66s   Breast cancer Paternal Grandmother 87 - 89    Social History   Tobacco Use   Smoking status: Former    Packs/day: .5    Types: Cigarettes    Quit date: 04/29/2022    Years since quitting: 0.1   Smokeless tobacco: Never  Vaping Use   Vaping Use: Never used  Substance Use Topics   Alcohol use: Yes   Drug use: Never    Review of Systems  Constitutional:  Negative for fever.   Gastrointestinal:  Negative for abdominal pain, nausea and vomiting.  Genitourinary:  Positive for frequency, urgency and vaginal bleeding. Negative for dysuria, flank pain, hematuria, pelvic pain, vaginal discharge and vaginal pain.  Musculoskeletal:  Positive for back pain.  Skin:  Positive for rash.     Objective:   Vitals: BP 122/86 (BP Location: Right Arm)   Pulse 78   Temp 98.4 F (36.9 C) (Oral)   Resp 14   LMP 05/11/2022 (Approximate) Comment: 05-11-22 to 05-15-22, Fallopian tubes have been removed  SpO2 96%   Physical Exam Constitutional:      General: She is not in acute distress.    Appearance: Normal appearance. She is well-developed and normal weight. She is not ill-appearing or toxic-appearing.  HENT:     Head: Normocephalic and atraumatic.  Cardiovascular:     Rate and Rhythm: Normal rate and regular rhythm.     Heart sounds: Normal heart sounds.  Pulmonary:     Effort: Pulmonary effort is normal.     Breath sounds: Normal breath sounds.     Comments: Clear to auscultation bilaterally  Abdominal:     General: Bowel sounds are normal.     Palpations: Abdomen is soft.     Tenderness: There is no abdominal tenderness. There is no right CVA tenderness or left CVA tenderness.  Skin:    General: Skin is warm and dry.     Findings: Rash present. Rash is macular and papular.     Comments: Patient has pink maculopapular lesions on bilateral lower extremities.  No warmth, erythema, discharge.  Neurological:     General: No focal deficit present.     Mental Status: She is alert.  Psychiatric:        Mood and Affect: Mood and affect normal.     Results:  Labs: Results for orders placed or performed during the hospital encounter of 06/15/22 (from the past 24 hour(s))  POC urinalysis dipstick     Status: Abnormal   Collection Time: 06/15/22 11:18 AM  Result Value Ref Range   Color, UA yellow yellow   Clarity, UA clear clear   Glucose, UA negative negative  mg/dL   Bilirubin, UA negative negative   Ketones, POC UA trace (5) (A) negative mg/dL   Spec Grav, UA 1.478 2.956 - 1.025   Blood, UA negative negative   pH, UA 6.0 5.0 - 8.0   Protein Ur, POC negative negative mg/dL   Urobilinogen, UA 0.2 0.2 or 1.0 E.U./dL   Nitrite, UA Negative Negative   Leukocytes, UA Negative Negative    Radiology: No results found.   UC Course/Treatments:  Procedures: Procedures   Medications Ordered in UC: Medications - No data to display   Assessment and Plan :     ICD-10-CM   1. Urinary tract infection without hematuria, site unspecified  N39.0  2. Rash and nonspecific skin eruption  R21     3. Malignant neoplasm of female breast, unspecified estrogen receptor status, unspecified laterality, unspecified site of breast (HCC)  C50.919      Urinary tract infection without hematuria: Afebrile, nontoxic-appearing, NAD. VSS. DDX includes but not limited to: Cystitis, pyelonephritis, malignancy UA was unremarkable today in office.  Urine culture is pending.  Per her chart, her urine culture on 06/13/2022 was positive for Klebsiella.  Sensitivity was not tested for tetracyclines.  Will stop doxycycline and start cefdinir 300 mg twice daily.  Recommend follow-up with oncology on Monday. Strict ED precautions were given and patient verbalized understanding.  Rash and nonspecific skin eruption: Active Improving Previously diagnosed.  Seems to be contributed to chemotherapy treatments.  Continue with Medrol Dosepak.  Follow-up with oncology on Monday.  Malignant neoplasm of female breast: Active Previously diagnosed.  Continue with chemotherapy treatments and Keytruda.  Follow-up oncology as directed.  ED Discharge Orders          Ordered    cefdinir (OMNICEF) 300 MG capsule  2 times daily        06/15/22 1130             PDMP not reviewed this encounter.     Elleanor Guyett P, PA-C 06/15/22 1200

## 2022-06-15 NOTE — Discharge Instructions (Signed)
We will call you with the results of your blood work.   You were prescribed Cefdinir which is an antibiotic that is often used to treat urinary tract infections.  Take the prescription as directed. A urine culture has been sent to the lab for further testing.  We will call you with those results.   Return in 2 to 3 days if no improvement. Please go directly to the Emergency Department immediately should you begin to have any of the following symptoms: persistent fevers, increased pain or persistent nausea/vomiting.  Please follow up with your oncologist on Monday.

## 2022-06-15 NOTE — ED Triage Notes (Addendum)
Patient states she has a rash to her arms and is using Kenalog cream. Patient states the rash started 4 days ago.  Patient c/o urinary frequency 3 days ago while doing chemo.   Patient has been taking steroids, cream, and doxycycline. Patient states she called someone at the cancer center and was told to get a jump start on her urinalysis to see if the current treatment is appropriate.   Patient also reports that she has been on her period x 2 weeks and normally it is 5-6 days.

## 2022-06-16 ENCOUNTER — Telehealth: Payer: Self-pay

## 2022-06-16 LAB — URINE CULTURE: Culture: NO GROWTH

## 2022-06-16 NOTE — Telephone Encounter (Signed)
Called to check in on Pt per NP request. Pt was seen in ED for UTI over the weekend. Pt reports she has been prescribed a new antibiotic and is still having hip pain but that overall her symptoms have improved. Encouraged Pt to reach back out to [place] if she feels like she needs to be seen sooner than her 06/19/22 appts. Pt verbalized understanding. Message relayed to NP.

## 2022-06-18 MED FILL — Dexamethasone Sodium Phosphate Inj 100 MG/10ML: INTRAMUSCULAR | Qty: 1 | Status: AC

## 2022-06-19 ENCOUNTER — Other Ambulatory Visit: Payer: Self-pay | Admitting: Hematology and Oncology

## 2022-06-19 ENCOUNTER — Telehealth: Payer: Self-pay | Admitting: *Deleted

## 2022-06-19 ENCOUNTER — Encounter: Payer: Self-pay | Admitting: Hematology and Oncology

## 2022-06-19 ENCOUNTER — Inpatient Hospital Stay: Payer: BC Managed Care – PPO

## 2022-06-19 ENCOUNTER — Inpatient Hospital Stay (HOSPITAL_BASED_OUTPATIENT_CLINIC_OR_DEPARTMENT_OTHER): Payer: BC Managed Care – PPO | Admitting: Hematology and Oncology

## 2022-06-19 ENCOUNTER — Other Ambulatory Visit: Payer: Self-pay

## 2022-06-19 DIAGNOSIS — Z170 Estrogen receptor positive status [ER+]: Secondary | ICD-10-CM

## 2022-06-19 DIAGNOSIS — C50211 Malignant neoplasm of upper-inner quadrant of right female breast: Secondary | ICD-10-CM

## 2022-06-19 DIAGNOSIS — Z95828 Presence of other vascular implants and grafts: Secondary | ICD-10-CM

## 2022-06-19 LAB — CBC WITH DIFFERENTIAL (CANCER CENTER ONLY)
Abs Immature Granulocytes: 0.14 10*3/uL — ABNORMAL HIGH (ref 0.00–0.07)
Basophils Absolute: 0 10*3/uL (ref 0.0–0.1)
Basophils Relative: 0 %
Eosinophils Absolute: 0 10*3/uL (ref 0.0–0.5)
Eosinophils Relative: 0 %
HCT: 35.5 % — ABNORMAL LOW (ref 36.0–46.0)
Hemoglobin: 12.3 g/dL (ref 12.0–15.0)
Immature Granulocytes: 2 %
Lymphocytes Relative: 30 %
Lymphs Abs: 2.8 10*3/uL (ref 0.7–4.0)
MCH: 32 pg (ref 26.0–34.0)
MCHC: 34.6 g/dL (ref 30.0–36.0)
MCV: 92.4 fL (ref 80.0–100.0)
Monocytes Absolute: 0.4 10*3/uL (ref 0.1–1.0)
Monocytes Relative: 4 %
Neutro Abs: 5.9 10*3/uL (ref 1.7–7.7)
Neutrophils Relative %: 64 %
Platelet Count: 198 10*3/uL (ref 150–400)
RBC: 3.84 MIL/uL — ABNORMAL LOW (ref 3.87–5.11)
RDW: 13.2 % (ref 11.5–15.5)
WBC Count: 9.2 10*3/uL (ref 4.0–10.5)
nRBC: 0 % (ref 0.0–0.2)

## 2022-06-19 LAB — CMP (CANCER CENTER ONLY)
ALT: 23 U/L (ref 0–44)
AST: 15 U/L (ref 15–41)
Albumin: 4.2 g/dL (ref 3.5–5.0)
Alkaline Phosphatase: 50 U/L (ref 38–126)
Anion gap: 7 (ref 5–15)
BUN: 15 mg/dL (ref 6–20)
CO2: 28 mmol/L (ref 22–32)
Calcium: 9.1 mg/dL (ref 8.9–10.3)
Chloride: 103 mmol/L (ref 98–111)
Creatinine: 0.63 mg/dL (ref 0.44–1.00)
GFR, Estimated: >60 mL/min (ref >60)
Glucose, Bld: 93 mg/dL (ref 70–99)
Potassium: 3.4 mmol/L — ABNORMAL LOW (ref 3.5–5.1)
Sodium: 138 mmol/L (ref 135–145)
Total Bilirubin: 0.3 mg/dL (ref 0.3–1.2)
Total Protein: 6.5 g/dL (ref 6.5–8.1)

## 2022-06-19 LAB — PREGNANCY, URINE: Preg Test, Ur: NEGATIVE

## 2022-06-19 MED ORDER — FAMOTIDINE 20 MG IN NS 100 ML IVPB
20.0000 mg | Freq: Once | INTRAVENOUS | Status: AC
  Administered 2022-06-19: 20 mg via INTRAVENOUS
  Filled 2022-06-19: qty 100

## 2022-06-19 MED ORDER — SODIUM CHLORIDE 0.9 % IV SOLN
169.8000 mg | Freq: Once | INTRAVENOUS | Status: AC
  Administered 2022-06-19: 170 mg via INTRAVENOUS
  Filled 2022-06-19: qty 17

## 2022-06-19 MED ORDER — PALONOSETRON HCL INJECTION 0.25 MG/5ML
0.2500 mg | Freq: Once | INTRAVENOUS | Status: AC
  Administered 2022-06-19: 0.25 mg via INTRAVENOUS
  Filled 2022-06-19: qty 5

## 2022-06-19 MED ORDER — SODIUM CHLORIDE 0.9 % IV SOLN
10.0000 mg | Freq: Once | INTRAVENOUS | Status: AC
  Administered 2022-06-19: 10 mg via INTRAVENOUS
  Filled 2022-06-19: qty 10

## 2022-06-19 MED ORDER — SODIUM CHLORIDE 0.9 % IV SOLN
80.0000 mg/m2 | Freq: Once | INTRAVENOUS | Status: AC
  Administered 2022-06-19: 132 mg via INTRAVENOUS
  Filled 2022-06-19: qty 22

## 2022-06-19 MED ORDER — DIPHENHYDRAMINE HCL 50 MG/ML IJ SOLN
50.0000 mg | Freq: Once | INTRAMUSCULAR | Status: AC
  Administered 2022-06-19: 50 mg via INTRAVENOUS
  Filled 2022-06-19: qty 1

## 2022-06-19 MED ORDER — SODIUM CHLORIDE 0.9% FLUSH
10.0000 mL | Freq: Once | INTRAVENOUS | Status: AC
  Administered 2022-06-19: 10 mL

## 2022-06-19 MED ORDER — SODIUM CHLORIDE 0.9 % IV SOLN: Freq: Once | INTRAVENOUS | Status: AC

## 2022-06-19 NOTE — Telephone Encounter (Addendum)
-----   Message from Rachel Moulds, MD sent at 06/19/2022  8:58 AM EDT ----- Can we stop doing pregnancy. She has no fallopian tubes.  Thanks,  [employer] in [place]- discussed and will remove urine pregnancy test from orders.

## 2022-06-19 NOTE — Assessment & Plan Note (Signed)
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  With tumor size larger than 2 cm, we have discussed about considering neoadjuvant chemotherapy based on keynote 522 study.  Other option would be to do neoadjuvant non anthracycline-based regimen since she has node-negative disease.   Given high-grade, high proliferation index and weak staining of estrogen receptor, I do believe this is likely to behave like a triple negative breast cancer. 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  We did discuss that if she is uncomfortable with the option to treated like a triple negative breast cancer we can certainly try to omit immunotherapy and adjust chemotherapy regimen.    She is here for a follow up before her planned cycle of chemo. Breast mass reduced in size, consistent with response. Ok to proceed with chemo today if labs are within parameters  #2, Bone pain from GCSF Ok to use tramadol PRN  #3 Skin rash, grade 1. If it happens again, she will contact me again. She responded well to steroids this past visit  #4 Vaginal dryness, ok to use coconut oil suppositories.

## 2022-06-19 NOTE — Progress Notes (Signed)
[facility] Cancer Follow up:    Vanessa Panda, NP [address] [place] [address]   DIAGNOSIS:  Cancer Staging  Malignant neoplasm of upper-inner quadrant of right breast in female, estrogen receptor positive (HCC) Staging form: Breast, AJCC 8th Edition - Clinical stage from 05/07/2022: Stage IIB (cT2, cN0, cM0, G3, ER+, PR-, HER2-) - Unsigned Stage prefix: Initial diagnosis Histologic grading system: 3 grade system Laterality: Right Staged by: Pathologist and managing physician Stage used in treatment planning: Yes National guidelines used in treatment planning: Yes Type of national guideline used in treatment planning: NCCN   SUMMARY OF ONCOLOGIC HISTORY: Oncology History  Malignant 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   04/25/2022 Pathology Results   Right breast needle core biopsy at 3:00 showed high-grade invasive ductal carcinoma.  Prognostic showed ER 40% positive weak staining intensity, PR 0% negative, group 5 HER2 negative and Ki-67 of 80%.   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 Report date is 05/14/2022.  The Custom Hereditary Cancers  Panel offered by [facility] includes sequencing and/or deletion duplication testing of the following 43 genes: APC, ATM, AXIN2, BAP1, BARD1, BMPR1A, BRCA1, BRCA2, BRIP1, CDH1, CDK4, CDKN2A (p14ARF and p16INK4a only), CHEK2, CTNNA1, EPCAM (Deletion/duplication testing only), FH, GREM1 (promoter region duplication testing only), HOXB13, KIT, MBD4, MEN1, MLH1, MSH2, MSH3, MSH6, MUTYH, NF1, NHTL1, PALB2, PDGFRA, PMS2, POLD1, POLE, PTEN, RAD51C, RAD51D, SMAD4, SMARCA4. STK11, TP53, TSC1, TSC2, and VHL.     CURRENT THERAPY:  Taxol/Carbo/Keytruda  INTERVAL HISTORY: Vanessa Murphy 42 y.o. female returns for f/u of her breast cancer on treatment with Taxol/Carbo/Keytruda.   Overall rash has improved, itches some but has gotten better. She has been using medrol dose pak. She still complains of nose bleeds, doesn't last long, not dripping, only noticed when she blows her nose. She still has some major joint arthralgias. She also reports some vaginal bleeding, more so like when she wipes. Rest of the pertinent 10 point reviewed and neg.  Patient Active Problem List   Diagnosis Date Noted   Port-A-Cath in place 05/22/2022   Genetic testing 05/15/2022   Family history of breast cancer 05/07/2022   Family history of melanoma 05/07/2022   Malignant neoplasm of upper-inner quadrant of right breast in female, estrogen receptor positive (HCC) 05/06/2022    is allergic to contrast media [iodinated contrast media], amitriptyline, and iodine.  MEDICAL HISTORY: Past Medical History:  Diagnosis Date   Cancer [facility])    Migraines     SURGICAL HISTORY: Past Surgical History:  Procedure Laterality Date   BREAST BIOPSY Right 04/25/2022   [place] RT BREAST BX W LOC DEV 1ST LESION IMG  BX SPEC US GUIDE 04/25/2022 [facility]   IR IMAGING GUIDED PORT INSERTION  05/21/2022    SOCIAL HISTORY: Social History   Socioeconomic History   Marital status: Single    Spouse name: Not on file   Number of children:  Not on file   Years of education: Not on file   Highest education level: Not on file  Occupational History   Not on file  Tobacco Use   Smoking status: Former    Packs/day: .5    Types: Cigarettes    Quit date: 04/29/2022    Years since quitting: 0.1   Smokeless tobacco: Never  Vaping Use   Vaping Use: Never used  Substance and Sexual Activity   Alcohol use: Yes   Drug use: Never   Sexual activity: Yes    Birth control/protection: Surgical    Comment: MRI 05-16-22 Left Breast Lump found  Other Topics Concern   Not on file  Social History Narrative   Not on file   Social Determinants of Health   Financial Resource Strain: Medium Risk (05/07/2022)   Overall Financial Resource Strain (CARDIA)    Difficulty of Paying Living Expenses: Somewhat hard  Food Insecurity: No Food Insecurity (05/07/2022)   Hunger Vital Sign    Worried About Running Out of Food in the Last Year: Never true    Ran Out of Food in the Last Year: Never true  Transportation Needs: No Transportation Needs (05/07/2022)   PRAPARE - Administrator, Civil Service (Medical): No    Lack of Transportation (Non-Medical): No  Physical Activity: Not on file  Stress: Stress Concern Present (05/07/2022)   [employer] of Occupational Health - Occupational Stress Questionnaire    Feeling of Stress : Very much  Social Connections: Not on file  Intimate Partner Violence: Not on file    FAMILY HISTORY: Family History  Problem Relation Age of Onset   Melanoma Father 21 - 1       dx. again in his early 86s   Breast cancer Paternal Grandmother 37 - 67    Review of Systems  Constitutional:  Positive for fatigue. Negative for appetite change, chills, fever and unexpected weight change.  HENT:   Negative for hearing loss, lump/mass and trouble swallowing.   Eyes:  Negative for eye problems and icterus.  Respiratory:  Negative for chest tightness, cough and shortness of breath.   Cardiovascular:  Negative  for chest pain, leg swelling and palpitations.  Gastrointestinal:  Negative for abdominal distention, abdominal pain, constipation, diarrhea, nausea and vomiting.  Endocrine: Negative for hot flashes.  Genitourinary:  Negative for difficulty urinating.   Musculoskeletal:  Negative for arthralgias.  Skin:  Positive for rash. Negative for itching.  Neurological:  Negative for dizziness, extremity weakness, headaches and numbness.  Hematological:  Negative for adenopathy. Does not bruise/bleed easily.  Psychiatric/Behavioral:  Negative for depression. The patient is not nervous/anxious.       PHYSICAL EXAMINATION     Vitals:   06/19/22 0837  BP: 111/75  Pulse: 70  Resp: 16  Temp: 97.7 F (36.5 C)  SpO2: 100%    Physical Exam Constitutional:      General: She is not in acute distress.    Appearance: Normal appearance. She is not toxic-appearing.  HENT:     Head: Normocephalic and atraumatic.  Eyes:     General: No scleral icterus. Cardiovascular:     Rate and Rhythm: Normal rate and regular rhythm.  Pulses: Normal pulses.     Heart sounds: Normal heart sounds.  Pulmonary:     Effort: Pulmonary effort is normal.     Breath sounds: Normal breath sounds.  Chest:     Comments: Right breast lower inner quadrant mass decreased in size conssistent with response. Abdominal:     General: Abdomen is flat. Bowel sounds are normal. There is no distension.     Palpations: Abdomen is soft.     Tenderness: There is no abdominal tenderness.  Musculoskeletal:        General: No swelling.     Cervical back: Neck supple.  Lymphadenopathy:     Cervical: No cervical adenopathy.  Skin:    General: Skin is warm and dry.     Findings: Rash (Skin rash improved) present.  Neurological:     General: No focal deficit present.     Mental Status: She is alert.  Psychiatric:        Mood and Affect: Mood normal.        Behavior: Behavior normal.     LABORATORY DATA:  CBC     Component Value Date/Time   WBC 9.2 06/19/2022 0817   WBC 7.5 06/15/2022 1126   RBC 3.84 (L) 06/19/2022 0817   HGB 12.3 06/19/2022 0817   HCT 35.5 (L) 06/19/2022 0817   PLT 198 06/19/2022 0817   MCV 92.4 06/19/2022 0817   MCH 32.0 06/19/2022 0817   MCHC 34.6 06/19/2022 0817   RDW 13.2 06/19/2022 0817   LYMPHSABS 2.8 06/19/2022 0817   MONOABS 0.4 06/19/2022 0817   EOSABS 0.0 06/19/2022 0817   BASOSABS 0.0 06/19/2022 0817    CMP     Component Value Date/Time   NA 138 06/11/2022 0816   K 4.0 06/11/2022 0816   CL 107 06/11/2022 0816   CO2 25 06/11/2022 0816   GLUCOSE 115 (H) 06/11/2022 0816   BUN 9 06/11/2022 0816   CREATININE 0.57 06/11/2022 0816   CALCIUM 8.7 (L) 06/11/2022 0816   PROT 6.1 (L) 06/11/2022 0816   ALBUMIN 3.9 06/11/2022 0816   AST 25 06/11/2022 0816   ALT 32 06/11/2022 0816   ALKPHOS 56 06/11/2022 0816   BILITOT 0.3 06/11/2022 0816   GFRNONAA >60 06/11/2022 0816       ASSESSMENT and THERAPY PLAN:   Malignant neoplasm of upper-inner quadrant of right breast in female, estrogen receptor positive (HCC) This is a very pleasant 42 year old female patient with newly diagnosed right breast high-grade invasive ductal carcinoma, ER +40% weak staining, PR 0% negative, HER2 negative, Ki-67 of 80% referred to breast [facility] for additional recommendations.  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  With tumor size larger than 2 cm, we have discussed about considering neoadjuvant chemotherapy based on keynote 522 study.  Other option would be to do neoadjuvant non anthracycline-based regimen since she has node-negative disease.   Given high-grade, high proliferation index and weak staining of estrogen receptor, I do believe this is likely to behave like a triple negative breast cancer. I have once again discussed with her that the ER is 40% however weak staining hence this is likely functional triple  negative breast cancer.  We did discuss that if she is uncomfortable with the option to treated like a triple negative breast cancer we can certainly try to omit immunotherapy and adjust chemotherapy regimen.    She is here for a follow up before her planned cycle of  chemo. Breast mass reduced in size, consistent with response. Ok to proceed with chemo today if labs are within parameters  #2, Bone pain from GCSF Ok to use tramadol PRN  #3 Skin rash, grade 1. If it happens again, she will contact me again. She responded well to steroids this past visit  #4 Vaginal dryness, ok to use coconut oil suppositories.    All questions were answered. The patient knows to call the clinic with any problems, questions or concerns. We can certainly see the patient much sooner if necessary.  Total encounter time:30 minutes*in face-to-face visit time, chart review, lab review, care coordination, order entry, and documentation of the encounter time.  *Total Encounter Time as defined by the Centers for Medicare and Medicaid Services includes, in addition to the face-to-face time of a patient visit (documented in the note above) non-face-to-face time: obtaining and reviewing outside history, ordering and reviewing medications, tests or procedures, care coordination (communications with other health care professionals or caregivers) and documentation in the medical record.

## 2022-06-20 ENCOUNTER — Other Ambulatory Visit: Payer: Self-pay | Admitting: Hematology and Oncology

## 2022-06-20 MED ORDER — TRAMADOL HCL 50 MG PO TABS: 50.0000 mg | ORAL_TABLET | Freq: Two times a day (BID) | ORAL | 0 refills | Status: DC | PRN

## 2022-06-24 ENCOUNTER — Inpatient Hospital Stay (HOSPITAL_BASED_OUTPATIENT_CLINIC_OR_DEPARTMENT_OTHER): Payer: BC Managed Care – PPO | Admitting: Physician Assistant

## 2022-06-24 ENCOUNTER — Inpatient Hospital Stay: Payer: BC Managed Care – PPO

## 2022-06-24 ENCOUNTER — Other Ambulatory Visit: Payer: Self-pay

## 2022-06-24 ENCOUNTER — Telehealth: Payer: Self-pay | Admitting: *Deleted

## 2022-06-24 VITALS — BP 119/75 | HR 95 | Temp 98.7°F | Resp 16

## 2022-06-24 DIAGNOSIS — E860 Dehydration: Secondary | ICD-10-CM

## 2022-06-24 DIAGNOSIS — M2550 Pain in unspecified joint: Secondary | ICD-10-CM

## 2022-06-24 DIAGNOSIS — Z170 Estrogen receptor positive status [ER+]: Secondary | ICD-10-CM | POA: Diagnosis not present

## 2022-06-24 DIAGNOSIS — C50211 Malignant neoplasm of upper-inner quadrant of right female breast: Secondary | ICD-10-CM

## 2022-06-24 LAB — CMP (CANCER CENTER ONLY)
ALT: 24 U/L (ref 0–44)
AST: 16 U/L (ref 15–41)
Albumin: 3.8 g/dL (ref 3.5–5.0)
Alkaline Phosphatase: 52 U/L (ref 38–126)
Anion gap: 6 (ref 5–15)
BUN: 14 mg/dL (ref 6–20)
CO2: 25 mmol/L (ref 22–32)
Calcium: 8.4 mg/dL — ABNORMAL LOW (ref 8.9–10.3)
Chloride: 103 mmol/L (ref 98–111)
Creatinine: 0.74 mg/dL (ref 0.44–1.00)
GFR, Estimated: >60 mL/min (ref >60)
Glucose, Bld: 95 mg/dL (ref 70–99)
Potassium: 3.8 mmol/L (ref 3.5–5.1)
Sodium: 134 mmol/L — ABNORMAL LOW (ref 135–145)
Total Bilirubin: 0.3 mg/dL (ref 0.3–1.2)
Total Protein: 5.8 g/dL — ABNORMAL LOW (ref 6.5–8.1)

## 2022-06-24 LAB — CBC WITH DIFFERENTIAL/PLATELET
Abs Immature Granulocytes: 0.05 10*3/uL (ref 0.00–0.07)
Basophils Absolute: 0 10*3/uL (ref 0.0–0.1)
Basophils Relative: 1 %
Eosinophils Absolute: 0 10*3/uL (ref 0.0–0.5)
Eosinophils Relative: 0 %
HCT: 30.8 % — ABNORMAL LOW (ref 36.0–46.0)
Hemoglobin: 10.9 g/dL — ABNORMAL LOW (ref 12.0–15.0)
Immature Granulocytes: 1 %
Lymphocytes Relative: 33 %
Lymphs Abs: 2.3 10*3/uL (ref 0.7–4.0)
MCH: 32.8 pg (ref 26.0–34.0)
MCHC: 35.4 g/dL (ref 30.0–36.0)
MCV: 92.8 fL (ref 80.0–100.0)
Monocytes Absolute: 0.3 10*3/uL (ref 0.1–1.0)
Monocytes Relative: 5 %
Neutro Abs: 4.2 10*3/uL (ref 1.7–7.7)
Neutrophils Relative %: 60 %
Platelets: 181 10*3/uL (ref 150–400)
RBC: 3.32 MIL/uL — ABNORMAL LOW (ref 3.87–5.11)
RDW: 13.9 % (ref 11.5–15.5)
WBC: 6.8 10*3/uL (ref 4.0–10.5)
nRBC: 0 % (ref 0.0–0.2)

## 2022-06-24 LAB — MAGNESIUM: Magnesium: 2 mg/dL (ref 1.7–2.4)

## 2022-06-24 MED ORDER — SODIUM CHLORIDE 0.9 % IV SOLN: INTRAVENOUS | Status: DC

## 2022-06-24 NOTE — Telephone Encounter (Signed)
This RN spoke with pt this AM ( note she called on call who left a message on this RN's VM due to early am call).  Vanessa Murphy states she developed bilateral knee and ankle pain starting Sunday- with onset of increased severity- interfering with her sleep and making walking difficult.  She has tramadol - which with ibuprofen did not relief pain.  She states use of a heating pad and then she took a warm bath did seem to help benefit.  She is not working due to concern with legs and driving.  Per discussion- pt will come in to this office at 1 pm

## 2022-06-24 NOTE — Progress Notes (Signed)
Symptom Management Consult Note [facility]    Patient Care Team: Marva Panda, NP as PCP - General Rachel Moulds, MD as Consulting Physician (Hematology and Oncology) Dorothy Puffer, MD as Consulting Physician (Radiation Oncology) Griselda Miner, MD as Consulting Physician (General Surgery) Pershing Proud, RN as Oncology Nurse Navigator Donnelly Angelica, RN as Oncology Nurse Navigator    Name / MRN / DOBAlleah Murphy  [id number]  May 18, 1980   Date of visit: 06/24/2022   Chief Complaint/Reason for visit: joint pain   Current Therapy: Carboplatin, paclitaxel, Keytruda and Zarxio  Last treatment:  Day 8   Cycle 2 on 06/19/22   ASSESSMENT & PLAN: Patient is a 42 y.o. female  with oncologic history of malignant neoplasm of upper-inner quadrant of right breast, estrogen receptor positive followed by Dr. Al Pimple.  I have viewed most recent oncology note and lab work.    #Malignant neoplasm of upper-inner quadrant of right breast, estrogen receptor positive  - Next appointment with oncologist is 06/26/22   #Arthralgias -Per chart review has been on reported during previous visits. - Pain is worse after most recent treatment which consisted of carbo and taxol. She did not receive G-CSF or immunotherapy that day. - Exam without joint swelling or erythema, overall unremarkable.  -CBC and CMP overall unremarkable. Patient received 500 mL NS for hydration support. - Arthralgia could be related to taxol. Will defer to primary team for further discussion on treatment regimen. -I advised patient to increase tramadol to 100 mg BID PRN until her next appointment to attempt better pain control. Also encouraged to continue to use heat for symptom management. -Patient is agreeable with plan of care.   Strict ED precautions discussed should symptoms worsen.    Heme/Onc History: Oncology History  Malignant neoplasm of upper-inner quadrant of right breast in  female, estrogen receptor positive (HCC)  04/24/2022 Mammogram   Bilateral diagnostic mammogram given palpable mass showed suspicious mass in the 3:00 location of the right breast for which biopsy was recommended.   04/25/2022 Pathology Results   Right breast needle core biopsy at 3:00 showed high-grade invasive ductal carcinoma.  Prognostic showed ER 40% positive weak staining intensity, PR 0% negative, group 5 HER2 negative and Ki-67 of 80%.   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 Report date is 05/14/2022.  The Custom Hereditary Cancers Panel offered by [facility] includes sequencing and/or deletion duplication testing of the following 43 genes: APC, ATM, AXIN2, BAP1, BARD1, BMPR1A, BRCA1, BRCA2, BRIP1, CDH1, CDK4, CDKN2A (p14ARF and p16INK4a only), CHEK2, CTNNA1, EPCAM (Deletion/duplication testing only), FH, GREM1 (promoter region duplication testing only), HOXB13, KIT, MBD4, MEN1, MLH1, MSH2, MSH3, MSH6, MUTYH, NF1, NHTL1, PALB2, PDGFRA, PMS2, POLD1, POLE, PTEN, RAD51C, RAD51D, SMAD4, SMARCA4. STK11, TP53, TSC1, TSC2, and VHL.       Interval history-: Vanessa  Murphy is a 42 y.o. female with oncologic history as above presenting to [facility] today with chief complaint of joint pain.  She presents unaccompanied to clinic.  Patient states she has had joint pain x 3 days.  It is intermittent.  It is worse in the  evening.  She states it is in all of her major joints.  She describes the pain as a stabbing sensation.  She rates the pain currently 7 out of 10 in severity.  Pain is present at rest and with movement.  She states the pain was at its worst last night while she was trying to sleep.  She states tramadol only mildly her pain today.  She took her last dose at 1:30 AM.  She also tried taking Benadryl and ibuprofen.  She denies any injury that could have caused the pain. She states this pain is more severe than when she had the bone pain from her Zarxio injection.  She denies any swelling. Patient adds that she finished her steroid Dosepak the day before pain started.  The rash she was admitted for has since resolved.    ROS  All other systems are reviewed and are negative for acute change except as noted in the HPI.    Allergies  Allergen Reactions   Contrast Media [Iodinated Contrast Media] Nausea And Vomiting    "Skin feeling hot"   Amitriptyline Other (See Comments)    Confusion, sleeplessness   Iodine Hives     Past Medical History:  Diagnosis Date   Cancer (HCC)    Migraines      Past Surgical History:  Procedure Laterality Date   BREAST BIOPSY Right 04/25/2022   [place] RT BREAST BX W LOC DEV 1ST LESION IMG BX SPEC US GUIDE 04/25/2022 [facility] MAMMOGRAPHY   IR IMAGING GUIDED PORT INSERTION  05/21/2022    Social History   Socioeconomic History   Marital status: Single    Spouse name: Not on file   Number of children: Not on file   Years of education: Not on file   Highest education level: Not on file  Occupational History   Not on file  Tobacco Use   Smoking status: Former    Packs/day: .5    Types: Cigarettes    Quit date: 04/29/2022    Years since quitting: 0.1   Smokeless tobacco: Never  Vaping Use   Vaping Use: Never used  Substance and Sexual Activity   Alcohol use: Yes   Drug use: Never   Sexual activity: Yes    Birth control/protection: Surgical    Comment: MRI  05-16-22 Left Breast Lump found  Other Topics Concern   Not on file  Social History Narrative   Not on file   Social Determinants of Health   Financial Resource Strain: Medium Risk (05/07/2022)   Overall Financial Resource Strain (CARDIA)    Difficulty of Paying Living Expenses: Somewhat hard  Food Insecurity: No Food Insecurity (05/07/2022)   Hunger Vital Sign    Worried About Running Out of Food in the Last Year: Never true    Ran Out of Food in the Last Year: Never true  Transportation Needs: No Transportation Needs (05/07/2022)   PRAPARE - Administrator, Civil Service (Medical): No    Lack of Transportation (Non-Medical): No  Physical Activity: Not on file  Stress: Stress Concern Present (05/07/2022)   [employer] of Occupational Health - Occupational Stress Questionnaire    Feeling of Stress : Very much  Social Connections: Not on file  Intimate Partner Violence: Not on file    Family History  Problem Relation Age of Onset   Melanoma Father 60 - 55       dx. again in his early 82s   Breast cancer Paternal Grandmother 72 - 29     Current Outpatient Medications:    cefdinir (OMNICEF) 300 MG capsule, Take 1 capsule (300 mg total) by mouth 2 (two) times daily., Disp: 14 capsule, Rfl: 0   dexamethasone (DECADRON) 4 MG tablet, Take 2 tablets daily for 2 days, start the day after chemotherapy. Take with food., Disp: 30 tablet, Rfl: 1   fluticasone (FLONASE) 50 MCG/ACT nasal spray, Place 2 sprays into both nostrils daily. OTC from [employer], Disp: , Rfl:    lidocaine-prilocaine (EMLA) cream, Apply to affected area once, Disp: 30 g, Rfl: 3   ondansetron (ZOFRAN) 8 MG tablet, Take 1 tablet (8 mg total) by mouth every 8 (eight) hours as needed for nausea or vomiting. Start on the third day after chemotherapy. (Patient not taking: Reported on 06/12/2022), Disp: 30 tablet, Rfl: 1   prochlorperazine (COMPAZINE) 10 MG tablet, Take 1 tablet (10 mg total) by mouth every 6 (six)  hours as needed for nausea or vomiting. (Patient not taking: Reported on 06/12/2022), Disp: 30 tablet, Rfl: 1   traMADol (ULTRAM) 50 MG tablet, Take 1 tablet (50 mg total) by mouth every 12 (twelve) hours as needed., Disp: 20 tablet, Rfl: 0   triamcinolone lotion (KENALOG) 0.1 %, Apply 1 Application topically 2 (two) times daily., Disp: 60 mL, Rfl: 1 No current facility-administered medications for this visit.  Facility-Administered Medications Ordered in Other Visits:    0.9 %  sodium chloride infusion, , Intravenous, Continuous, Walisiewicz, Lael Wetherbee E, PA-C, Stopped at 06/24/22 1432  PHYSICAL EXAM: ECOG FS:2 - Symptomatic, <50% confined to bed    Vitals:   06/24/22 1303  BP: 119/75  Pulse: 95  Resp: 16  Temp: 98.7 F (37.1 C)  TempSrc: Oral  SpO2: 100%   Physical Exam Vitals and nursing note reviewed.  Constitutional:      Appearance: She is not ill-appearing or toxic-appearing.  HENT:     Head: Normocephalic.  Eyes:     Conjunctiva/sclera: Conjunctivae normal.  Cardiovascular:     Rate and Rhythm: Normal rate and regular rhythm.     Pulses: Normal pulses.     Heart sounds: Normal heart sounds.  Pulmonary:     Effort: Pulmonary effort is normal.     Breath sounds: Normal breath sounds.  Abdominal:     General: There is no distension.  Musculoskeletal:     Cervical back: Normal range of motion.     Comments: Antalgic gait. Full ROM of all extremities. No obvious deformities. Compartments are soft in all extremities.  Skin:    General: Skin is warm and dry.     Findings: No bruising or rash.     Comments: Equal tactile temperature in all extremities  Neurological:     Mental Status: She is alert.        LABORATORY DATA: I have reviewed the data as listed    Latest Ref Rng & Units 06/24/2022    1:52 PM 06/19/2022    8:17 AM 06/15/2022   11:26 AM  CBC  WBC 4.0 - 10.5 K/uL 6.8  9.2  7.5   Hemoglobin 12.0 - 15.0 g/dL 54.0  98.1  19.1   Hematocrit 36.0 - 46.0 %  30.8  35.5  38.3   Platelets 150 - 400 K/uL 181  198  186         Latest Ref Rng & Units 06/24/2022    1:52 PM 06/19/2022    8:17 AM 06/11/2022    8:16 AM  CMP  Glucose 70 - 99 mg/dL 95  93  161   BUN 6 - 20 mg/dL 14  15  9    Creatinine 0.44 - 1.00 mg/dL 0.96  0.45  4.09   Sodium 135 - 145 mmol/L 134  138  138   Potassium 3.5 - 5.1 mmol/L 3.8  3.4  4.0   Chloride 98 - 111 mmol/L 103  103  107   CO2 22 - 32 mmol/L 25  28  25    Calcium 8.9 - 10.3 mg/dL 8.4  9.1  8.7   Total Protein 6.5 - 8.1 g/dL 5.8  6.5  6.1   Total Bilirubin 0.3 - 1.2 mg/dL 0.3  0.3  0.3   Alkaline Phos 38 - 126 U/L 52  50  56   AST 15 - 41 U/L 16  15  25    ALT 0 - 44 U/L 24  23  32        RADIOGRAPHIC STUDIES (from last 24 hours if applicable) I have personally reviewed the radiological images as listed and agreed with the findings in the report. No results found.      Visit Diagnosis: 1. Arthralgia, unspecified joint   2. Malignant neoplasm of upper-inner quadrant of right breast in female, estrogen receptor positive (HCC)      No orders of the defined types were placed in this encounter.   All questions were answered. The patient knows to call the clinic with any problems, questions or concerns. No barriers to learning was detected.  A total of more than 20 minutes were spent on this encounter with face-to-face time and non-face-to-face time, including preparing to see the patient, ordering tests and/or medications, counseling the patient and coordination of care as outlined above.    Thank you for allowing me to participate in the care of this patient.    Shanon Ace, PA-C Department of Hematology/Oncology [facility] at [facility] Phone: [phone]  [phone]    06/24/2022 3:06 PM

## 2022-06-25 ENCOUNTER — Encounter: Payer: Self-pay | Admitting: Hematology and Oncology

## 2022-06-25 ENCOUNTER — Telehealth: Payer: Self-pay | Admitting: *Deleted

## 2022-06-25 ENCOUNTER — Other Ambulatory Visit: Payer: Self-pay | Admitting: *Deleted

## 2022-06-25 MED ORDER — TIZANIDINE HCL 2 MG PO TABS: 2.0000 mg | ORAL_TABLET | Freq: Every evening | ORAL | 0 refills | Status: DC | PRN

## 2022-06-25 MED ORDER — TIZANIDINE HCL 2 MG PO CAPS: 2.0000 mg | ORAL_CAPSULE | Freq: Every evening | ORAL | 0 refills | Status: DC | PRN

## 2022-06-25 MED FILL — Dexamethasone Sodium Phosphate Inj 100 MG/10ML: INTRAMUSCULAR | Qty: 1 | Status: AC

## 2022-06-25 NOTE — Telephone Encounter (Signed)
Per review with [facility]/NP prescription for tizanidine 2mg  sent to pharmacy.  Discussed with pt need to use above and ultram for discomfort.  Pt verbalized understanding.

## 2022-06-25 NOTE — Telephone Encounter (Signed)
This RN spoke with pt per her call stating symptoms continued during the night - " I was in a deep sleep and awoke with severe pain in my knees- wrist and elbows. "  She obtained some relief with heat and warm bath.  This AM she proceeded to work and has taken an Ultram - she states pain does not seem as severe during the day.  She is asking how to best manage and is there something more beneficial to help her thru the night.  Of note she did not take Ultram prior to going to bed.  She is concerned about how the pain interferes with her sleep as she has not been able to sleep well for several nights.  She describes pain as "sharp like a knife".  This message will be reviewed with provider for appropriate recommendations.

## 2022-06-25 NOTE — Progress Notes (Signed)
[facility] Cancer Follow up:    Marva Panda, NP [address] [place] [address]   DIAGNOSIS: Cancer Staging  Malignant neoplasm of upper-inner quadrant of right breast in female, estrogen receptor positive (HCC) Staging form: Breast, AJCC 8th Edition - Clinical stage from 05/07/2022: Stage IIB (cT2, cN0, cM0, G3, ER+, PR-, HER2-) - Unsigned Stage prefix: Initial diagnosis Histologic grading system: 3 grade system Laterality: Right Staged by: Pathologist and managing physician Stage used in treatment planning: Yes National guidelines used in treatment planning: Yes Type of national guideline used in treatment planning: NCCN   SUMMARY OF ONCOLOGIC HISTORY: Oncology History  Malignant 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   04/25/2022 Pathology Results   Right breast needle core biopsy at 3:00 showed high-grade invasive ductal carcinoma.  Prognostic showed ER 40% positive weak staining intensity, PR 0% negative, group 5 HER2 negative and Ki-67 of 80%.   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 Report date is 05/14/2022.  The Custom Hereditary Cancers  Panel offered by [facility] includes sequencing and/or deletion duplication testing of the following 43 genes: APC, ATM, AXIN2, BAP1, BARD1, BMPR1A, BRCA1, BRCA2, BRIP1, CDH1, CDK4, CDKN2A (p14ARF and p16INK4a only), CHEK2, CTNNA1, EPCAM (Deletion/duplication testing only), FH, GREM1 (promoter region duplication testing only), HOXB13, KIT, MBD4, MEN1, MLH1, MSH2, MSH3, MSH6, MUTYH, NF1, NHTL1, PALB2, PDGFRA, PMS2, POLD1, POLE, PTEN, RAD51C, RAD51D, SMAD4, SMARCA4. STK11, TP53, TSC1, TSC2, and VHL.     CURRENT THERAPY:  INTERVAL HISTORY: Arnesha Sweetin 42 y.o. female returns for    Patient Active Problem List   Diagnosis Date Noted   Port-A-Cath in place 05/22/2022   Genetic testing 05/15/2022   Family history of breast cancer 05/07/2022   Family history of melanoma 05/07/2022   Malignant neoplasm of upper-inner quadrant of right breast in female, estrogen receptor positive (HCC) 05/06/2022    is allergic to contrast media [iodinated contrast media], amitriptyline, and iodine.  MEDICAL HISTORY: Past Medical History:  Diagnosis Date   Cancer (HCC)    Migraines     SURGICAL HISTORY: Past Surgical History:  Procedure Laterality Date   BREAST BIOPSY Right 04/25/2022   [place] RT BREAST BX W LOC DEV 1ST LESION IMG BX SPEC US GUIDE 04/25/2022 [facility] MAMMOGRAPHY   IR IMAGING GUIDED PORT INSERTION  05/21/2022    SOCIAL HISTORY: Social History   Socioeconomic History   Marital status: Single    Spouse name: Not on file   Number of children: Not on file   Years of education: Not on file   Highest education level: Not on file  Occupational History   Not on file  Tobacco Use  Smoking status: Former    Packs/day: .5    Types: Cigarettes    Quit date: 04/29/2022    Years since quitting: 0.1   Smokeless tobacco: Never  Vaping Use   Vaping Use: Never used  Substance and Sexual Activity   Alcohol use: Yes   Drug use: Never   Sexual activity: Yes    Birth control/protection:  Surgical    Comment: MRI 05-16-22 Left Breast Lump found  Other Topics Concern   Not on file  Social History Narrative   Not on file   Social Determinants of Health   Financial Resource Strain: Medium Risk (05/07/2022)   Overall Financial Resource Strain (CARDIA)    Difficulty of Paying Living Expenses: Somewhat hard  Food Insecurity: No Food Insecurity (05/07/2022)   Hunger Vital Sign    Worried About Running Out of Food in the Last Year: Never true    Ran Out of Food in the Last Year: Never true  Transportation Needs: No Transportation Needs (05/07/2022)   PRAPARE - Administrator, Civil Service (Medical): No    Lack of Transportation (Non-Medical): No  Physical Activity: Not on file  Stress: Stress Concern Present (05/07/2022)   [employer] of Occupational Health - Occupational Stress Questionnaire    Feeling of Stress : Very much  Social Connections: Not on file  Intimate Partner Violence: Not on file    FAMILY HISTORY: Family History  Problem Relation Age of Onset   Melanoma Father 73 - 46       dx. again in his early 97s   Breast cancer Paternal Grandmother 24 - 57    Review of Systems  Constitutional:  Negative for appetite change, chills, fatigue, fever and unexpected weight change.  HENT:   Negative for hearing loss, lump/mass and trouble swallowing.   Eyes:  Negative for eye problems and icterus.  Respiratory:  Negative for chest tightness, cough and shortness of breath.   Cardiovascular:  Negative for chest pain, leg swelling and palpitations.  Gastrointestinal:  Negative for abdominal distention, abdominal pain, constipation, diarrhea, nausea and vomiting.  Endocrine: Negative for hot flashes.  Genitourinary:  Negative for difficulty urinating.   Musculoskeletal:  Negative for arthralgias.  Skin:  Negative for itching and rash.  Neurological:  Negative for dizziness, extremity weakness, headaches and numbness.  Hematological:  Negative for  adenopathy. Does not bruise/bleed easily.  Psychiatric/Behavioral:  Negative for depression. The patient is not nervous/anxious.       PHYSICAL EXAMINATION    There were no vitals filed for this visit.  Physical Exam Constitutional:      General: She is not in acute distress.    Appearance: Normal appearance. She is not toxic-appearing.  HENT:     Head: Normocephalic and atraumatic.  Eyes:     General: No scleral icterus. Cardiovascular:     Rate and Rhythm: Normal rate and regular rhythm.     Pulses: Normal pulses.     Heart sounds: Normal heart sounds.  Pulmonary:     Effort: Pulmonary effort is normal.     Breath sounds: Normal breath sounds.  Abdominal:     General: Abdomen is flat. Bowel sounds are normal. There is no distension.     Palpations: Abdomen is soft.     Tenderness: There is no abdominal tenderness.  Musculoskeletal:        General: No swelling.     Cervical back: Neck supple.  Lymphadenopathy:     Cervical: No cervical  adenopathy.  Skin:    General: Skin is warm and dry.     Findings: No rash.  Neurological:     General: No focal deficit present.     Mental Status: She is alert.  Psychiatric:        Mood and Affect: Mood normal.        Behavior: Behavior normal.     LABORATORY DATA:  CBC    Component Value Date/Time   WBC 6.8 06/24/2022 1352   RBC 3.32 (L) 06/24/2022 1352   HGB 10.9 (L) 06/24/2022 1352   HGB 12.3 06/19/2022 0817   HCT 30.8 (L) 06/24/2022 1352   PLT 181 06/24/2022 1352   PLT 198 06/19/2022 0817   MCV 92.8 06/24/2022 1352   MCH 32.8 06/24/2022 1352   MCHC 35.4 06/24/2022 1352   RDW 13.9 06/24/2022 1352   LYMPHSABS 2.3 06/24/2022 1352   MONOABS 0.3 06/24/2022 1352   EOSABS 0.0 06/24/2022 1352   BASOSABS 0.0 06/24/2022 1352    CMP     Component Value Date/Time   NA 134 (L) 06/24/2022 1352   K 3.8 06/24/2022 1352   CL 103 06/24/2022 1352   CO2 25 06/24/2022 1352   GLUCOSE 95 06/24/2022 1352   BUN 14 06/24/2022  1352   CREATININE 0.74 06/24/2022 1352   CALCIUM 8.4 (L) 06/24/2022 1352   PROT 5.8 (L) 06/24/2022 1352   ALBUMIN 3.8 06/24/2022 1352   AST 16 06/24/2022 1352   ALT 24 06/24/2022 1352   ALKPHOS 52 06/24/2022 1352   BILITOT 0.3 06/24/2022 1352   GFRNONAA >60 06/24/2022 1352      ASSESSMENT and THERAPY PLAN:   No problem-specific Assessment & Plan notes found for this encounter.   All questions were answered. The patient knows to call the clinic with any problems, questions or concerns. We can certainly see the patient much sooner if necessary.  Total encounter time:*** minutes*in face-to-face visit time, chart review, lab review, care coordination, order entry, and documentation of the encounter time.  Lillard Anes, NP 06/25/22 6:59 PM Medical Oncology and Hematology [facility] [address] [place], [place] [address] Tel. [phone]    Fax. [phone]  *Total Encounter Time as defined by the Centers for Medicare and Medicaid Services includes, in addition to the face-to-face time of a patient visit (documented in the note above) non-face-to-face time: obtaining and reviewing outside history, ordering and reviewing medications, tests or procedures, care coordination (communications with other health care professionals or caregivers) and documentation in the medical record.

## 2022-06-26 ENCOUNTER — Inpatient Hospital Stay: Payer: BC Managed Care – PPO

## 2022-06-26 ENCOUNTER — Inpatient Hospital Stay (HOSPITAL_BASED_OUTPATIENT_CLINIC_OR_DEPARTMENT_OTHER): Payer: BC Managed Care – PPO | Admitting: Adult Health

## 2022-06-26 ENCOUNTER — Other Ambulatory Visit: Payer: Self-pay | Admitting: *Deleted

## 2022-06-26 ENCOUNTER — Encounter: Payer: Self-pay | Admitting: Adult Health

## 2022-06-26 VITALS — BP 121/74 | HR 68 | Temp 99.0°F | Resp 17 | Ht 64.0 in | Wt 142.2 lb

## 2022-06-26 VITALS — Temp 98.2°F

## 2022-06-26 DIAGNOSIS — C50211 Malignant neoplasm of upper-inner quadrant of right female breast: Secondary | ICD-10-CM

## 2022-06-26 DIAGNOSIS — R52 Pain, unspecified: Secondary | ICD-10-CM

## 2022-06-26 DIAGNOSIS — Z170 Estrogen receptor positive status [ER+]: Secondary | ICD-10-CM

## 2022-06-26 DIAGNOSIS — R509 Fever, unspecified: Secondary | ICD-10-CM

## 2022-06-26 LAB — CBC WITH DIFFERENTIAL (CANCER CENTER ONLY)
Abs Immature Granulocytes: 0.03 10*3/uL (ref 0.00–0.07)
Basophils Absolute: 0 10*3/uL (ref 0.0–0.1)
Basophils Relative: 0 %
Eosinophils Absolute: 0 10*3/uL (ref 0.0–0.5)
Eosinophils Relative: 0 %
HCT: 31 % — ABNORMAL LOW (ref 36.0–46.0)
Hemoglobin: 10.5 g/dL — ABNORMAL LOW (ref 12.0–15.0)
Immature Granulocytes: 1 %
Lymphocytes Relative: 25 %
Lymphs Abs: 1.2 10*3/uL (ref 0.7–4.0)
MCH: 31.8 pg (ref 26.0–34.0)
MCHC: 33.9 g/dL (ref 30.0–36.0)
MCV: 93.9 fL (ref 80.0–100.0)
Monocytes Absolute: 0.3 10*3/uL (ref 0.1–1.0)
Monocytes Relative: 6 %
Neutro Abs: 3.4 10*3/uL (ref 1.7–7.7)
Neutrophils Relative %: 68 %
Platelet Count: 138 10*3/uL — ABNORMAL LOW (ref 150–400)
RBC: 3.3 MIL/uL — ABNORMAL LOW (ref 3.87–5.11)
RDW: 14.1 % (ref 11.5–15.5)
WBC Count: 5 10*3/uL (ref 4.0–10.5)
nRBC: 0 % (ref 0.0–0.2)

## 2022-06-26 LAB — CMP (CANCER CENTER ONLY)
ALT: 22 U/L (ref 0–44)
AST: 16 U/L (ref 15–41)
Albumin: 3.5 g/dL (ref 3.5–5.0)
Alkaline Phosphatase: 48 U/L (ref 38–126)
Anion gap: 4 — ABNORMAL LOW (ref 5–15)
BUN: 9 mg/dL (ref 6–20)
CO2: 26 mmol/L (ref 22–32)
Calcium: 8 mg/dL — ABNORMAL LOW (ref 8.9–10.3)
Chloride: 107 mmol/L (ref 98–111)
Creatinine: 0.48 mg/dL (ref 0.44–1.00)
GFR, Estimated: >60 mL/min (ref >60)
Glucose, Bld: 99 mg/dL (ref 70–99)
Potassium: 3.7 mmol/L (ref 3.5–5.1)
Sodium: 137 mmol/L (ref 135–145)
Total Bilirubin: 0.4 mg/dL (ref 0.3–1.2)
Total Protein: 5.5 g/dL — ABNORMAL LOW (ref 6.5–8.1)

## 2022-06-26 LAB — RESP PANEL BY RT-PCR (RSV, FLU A&B, COVID)  RVPGX2
Influenza A by PCR: NEGATIVE
Influenza B by PCR: NEGATIVE
Resp Syncytial Virus by PCR: NEGATIVE
SARS Coronavirus 2 by RT PCR: NEGATIVE

## 2022-06-26 MED ORDER — SODIUM CHLORIDE 0.9% FLUSH
10.0000 mL | INTRAVENOUS | Status: DC | PRN
  Administered 2022-06-26: 10 mL

## 2022-06-26 MED ORDER — SODIUM CHLORIDE 0.9 % IV SOLN
169.8000 mg | Freq: Once | INTRAVENOUS | Status: AC
  Administered 2022-06-26: 170 mg via INTRAVENOUS
  Filled 2022-06-26: qty 17

## 2022-06-26 MED ORDER — SODIUM CHLORIDE 0.9 % IV SOLN
80.0000 mg/m2 | Freq: Once | INTRAVENOUS | Status: AC
  Administered 2022-06-26: 132 mg via INTRAVENOUS
  Filled 2022-06-26: qty 22

## 2022-06-26 MED ORDER — HEPARIN SOD (PORK) LOCK FLUSH 100 UNIT/ML IV SOLN
500.0000 [IU] | Freq: Once | INTRAVENOUS | Status: AC | PRN
  Administered 2022-06-26: 500 [IU]

## 2022-06-26 MED ORDER — PALONOSETRON HCL INJECTION 0.25 MG/5ML
0.2500 mg | Freq: Once | INTRAVENOUS | Status: AC
  Administered 2022-06-26: 0.25 mg via INTRAVENOUS
  Filled 2022-06-26: qty 5

## 2022-06-26 MED ORDER — SODIUM CHLORIDE 0.9 % IV SOLN: Freq: Once | INTRAVENOUS | Status: AC

## 2022-06-26 MED ORDER — DIPHENHYDRAMINE HCL 50 MG/ML IJ SOLN
50.0000 mg | Freq: Once | INTRAMUSCULAR | Status: AC
  Administered 2022-06-26: 50 mg via INTRAVENOUS
  Filled 2022-06-26: qty 1

## 2022-06-26 MED ORDER — FAMOTIDINE 20 MG IN NS 100 ML IVPB
20.0000 mg | Freq: Once | INTRAVENOUS | Status: AC
  Administered 2022-06-26: 20 mg via INTRAVENOUS
  Filled 2022-06-26: qty 100

## 2022-06-26 MED ORDER — SODIUM CHLORIDE 0.9 % IV SOLN
10.0000 mg | Freq: Once | INTRAVENOUS | Status: AC
  Administered 2022-06-26: 10 mg via INTRAVENOUS
  Filled 2022-06-26: qty 10

## 2022-06-26 NOTE — Assessment & Plan Note (Signed)
Vanessa Murphy is a 42 year old woman with stage IIb right-sided breast cancer here today for follow-up and evaluation prior to receiving neoadjuvant Keytruda, Taxol, carbo.  Stage IIb breast cancer: Her labs are stable today.  She will proceed with treatment. Myalgias: This could very well be related to the Taxol however considering her low-grade fever and cough I we will test her for COVID and the flu to ensure she does not have a virus prior to receiving her chemotherapy.  She will continue tramadol and tizanidine for the pain. Fatigue: She is managing this with energy conservation and is able to continue to work.  Vanessa Murphy will return next week for labs, follow-up, and her next treatment.

## 2022-06-26 NOTE — Patient Instructions (Signed)
[facility]  Discharge Instructions: Thank you for choosing [facility] to provide your oncology and hematology care.   If you have a lab appointment with the Cancer Center, please go directly to the Cancer Center and check in at the registration area.   Wear comfortable clothing and clothing appropriate for easy access to any Portacath or PICC line.   We strive to give you quality time with your provider. You may need to reschedule your appointment if you arrive late (15 or more minutes).  Arriving late affects you and other patients whose appointments are after yours.  Also, if you miss three or more appointments without notifying the office, you may be dismissed from the clinic at the provider's discretion.      For prescription refill requests, have your pharmacy contact our office and allow 72 hours for refills to be completed.    Today you received the following chemotherapy and/or immunotherapy agents - Taxol/Carboplatin      To help prevent nausea and vomiting after your treatment, we encourage you to take your nausea medication as directed.  BELOW ARE SYMPTOMS THAT SHOULD BE REPORTED IMMEDIATELY: *FEVER GREATER THAN 100.4 F (38 C) OR HIGHER *CHILLS OR SWEATING *NAUSEA AND VOMITING THAT IS NOT CONTROLLED WITH YOUR NAUSEA MEDICATION *UNUSUAL SHORTNESS OF BREATH *UNUSUAL BRUISING OR BLEEDING *URINARY PROBLEMS (pain or burning when urinating, or frequent urination) *BOWEL PROBLEMS (unusual diarrhea, constipation, pain near the anus) TENDERNESS IN MOUTH AND THROAT WITH OR WITHOUT PRESENCE OF ULCERS (sore throat, sores in mouth, or a toothache) UNUSUAL RASH, SWELLING OR PAIN  UNUSUAL VAGINAL DISCHARGE OR ITCHING   Items with * indicate a potential emergency and should be followed up as soon as possible or go to the Emergency Department if any problems should occur.  Please show the CHEMOTHERAPY ALERT CARD or IMMUNOTHERAPY ALERT CARD  at check-in to the Emergency Department and triage nurse.  Should you have questions after your visit or need to cancel or reschedule your appointment, please contact [facility]  Dept: [phone]  and follow the prompts.  Office hours are 8:00 a.m. to 4:30 p.m. Monday - Friday. Please note that voicemails left after 4:00 p.m. may not be returned until the following business day.  We are closed weekends and major holidays. You have access to a nurse at all times for urgent questions. Please call the main number to the clinic Dept: [phone] and follow the prompts.   For any non-urgent questions, you may also contact your provider using MyChart. We now offer e-Visits for anyone 18 and older to request care online for non-urgent symptoms. For details visit [email or web address].   Also download the MyChart app! Go to the app store, search "MyChart", open the app, select [facility], and log in with your MyChart username and password.  

## 2022-06-27 ENCOUNTER — Inpatient Hospital Stay: Payer: BC Managed Care – PPO

## 2022-06-27 ENCOUNTER — Encounter: Payer: Self-pay | Admitting: *Deleted

## 2022-06-27 VITALS — BP 131/81 | HR 84 | Temp 98.9°F | Resp 16

## 2022-06-27 DIAGNOSIS — C50211 Malignant neoplasm of upper-inner quadrant of right female breast: Secondary | ICD-10-CM

## 2022-06-27 MED ORDER — FILGRASTIM-SNDZ 300 MCG/0.5ML IJ SOSY
300.0000 ug | PREFILLED_SYRINGE | Freq: Once | INTRAMUSCULAR | Status: AC
  Administered 2022-06-27: 300 ug via SUBCUTANEOUS
  Filled 2022-06-27: qty 0.5

## 2022-06-28 ENCOUNTER — Encounter: Payer: Self-pay | Admitting: Hematology and Oncology

## 2022-06-28 ENCOUNTER — Inpatient Hospital Stay: Payer: BC Managed Care – PPO

## 2022-06-28 ENCOUNTER — Encounter: Payer: Self-pay | Admitting: Adult Health

## 2022-06-28 VITALS — BP 114/85 | HR 77 | Temp 97.9°F | Resp 20

## 2022-06-28 DIAGNOSIS — Z170 Estrogen receptor positive status [ER+]: Secondary | ICD-10-CM

## 2022-06-28 DIAGNOSIS — C50211 Malignant neoplasm of upper-inner quadrant of right female breast: Secondary | ICD-10-CM | POA: Diagnosis not present

## 2022-06-28 MED ORDER — METHYLPREDNISOLONE 4 MG PO TBPK: ORAL_TABLET | ORAL | 0 refills | Status: DC

## 2022-06-28 MED ORDER — FILGRASTIM-SNDZ 300 MCG/0.5ML IJ SOSY
300.0000 ug | PREFILLED_SYRINGE | Freq: Once | INTRAMUSCULAR | Status: AC
  Administered 2022-06-28: 300 ug via SUBCUTANEOUS

## 2022-06-30 ENCOUNTER — Other Ambulatory Visit: Payer: Self-pay | Admitting: *Deleted

## 2022-06-30 ENCOUNTER — Telehealth: Payer: Self-pay | Admitting: *Deleted

## 2022-06-30 ENCOUNTER — Inpatient Hospital Stay: Payer: BC Managed Care – PPO

## 2022-06-30 VITALS — BP 143/89 | HR 71 | Temp 98.6°F | Resp 18

## 2022-06-30 DIAGNOSIS — C50211 Malignant neoplasm of upper-inner quadrant of right female breast: Secondary | ICD-10-CM | POA: Diagnosis not present

## 2022-06-30 DIAGNOSIS — Z170 Estrogen receptor positive status [ER+]: Secondary | ICD-10-CM

## 2022-06-30 MED ORDER — METHYLPREDNISOLONE 4 MG PO TBPK: ORAL_TABLET | ORAL | 0 refills | Status: DC

## 2022-06-30 MED ORDER — FILGRASTIM-SNDZ 300 MCG/0.5ML IJ SOSY
300.0000 ug | PREFILLED_SYRINGE | Freq: Once | INTRAMUSCULAR | Status: AC
  Administered 2022-06-30: 300 ug via SUBCUTANEOUS
  Filled 2022-06-30: qty 0.5

## 2022-06-30 NOTE — Progress Notes (Addendum)
Patient here for Zarxio injection. BP 155/86, retake 143/89, Iruku, MD notified. Patient denies any headache or dizziness; says she is currently being treated for oral thrush and have been feeling tired. She refused to stay for observation and said the doctor can give her a call.

## 2022-06-30 NOTE — Telephone Encounter (Signed)
Vanessa Murphy states she went to urgent care this week and was diagnosed with thrush. She is now taking Mycelex troches.

## 2022-07-01 ENCOUNTER — Other Ambulatory Visit: Payer: Self-pay | Admitting: Hematology and Oncology

## 2022-07-01 MED ORDER — TRAMADOL HCL 50 MG PO TABS: 50.0000 mg | ORAL_TABLET | Freq: Two times a day (BID) | ORAL | 0 refills | Status: DC | PRN

## 2022-07-02 MED FILL — Dexamethasone Sodium Phosphate Inj 100 MG/10ML: INTRAMUSCULAR | Qty: 1 | Status: AC

## 2022-07-03 ENCOUNTER — Inpatient Hospital Stay (HOSPITAL_BASED_OUTPATIENT_CLINIC_OR_DEPARTMENT_OTHER): Payer: BC Managed Care – PPO | Admitting: Adult Health

## 2022-07-03 ENCOUNTER — Encounter: Payer: Self-pay | Admitting: Adult Health

## 2022-07-03 ENCOUNTER — Inpatient Hospital Stay: Payer: BC Managed Care – PPO

## 2022-07-03 VITALS — BP 122/71 | HR 69 | Temp 98.1°F | Resp 18 | Ht 64.0 in | Wt 139.8 lb

## 2022-07-03 DIAGNOSIS — Z170 Estrogen receptor positive status [ER+]: Secondary | ICD-10-CM | POA: Diagnosis not present

## 2022-07-03 DIAGNOSIS — C50211 Malignant neoplasm of upper-inner quadrant of right female breast: Secondary | ICD-10-CM | POA: Diagnosis not present

## 2022-07-03 DIAGNOSIS — Z95828 Presence of other vascular implants and grafts: Secondary | ICD-10-CM

## 2022-07-03 LAB — CBC WITH DIFFERENTIAL (CANCER CENTER ONLY)
Abs Immature Granulocytes: 1.3 10*3/uL — ABNORMAL HIGH (ref 0.00–0.07)
Basophils Absolute: 0.1 10*3/uL (ref 0.0–0.1)
Basophils Relative: 1 %
Eosinophils Absolute: 0 10*3/uL (ref 0.0–0.5)
Eosinophils Relative: 0 %
HCT: 32.8 % — ABNORMAL LOW (ref 36.0–46.0)
Hemoglobin: 11.3 g/dL — ABNORMAL LOW (ref 12.0–15.0)
Immature Granulocytes: 13 %
Lymphocytes Relative: 31 %
Lymphs Abs: 3 10*3/uL (ref 0.7–4.0)
MCH: 32.1 pg (ref 26.0–34.0)
MCHC: 34.5 g/dL (ref 30.0–36.0)
MCV: 93.2 fL (ref 80.0–100.0)
Monocytes Absolute: 0.7 10*3/uL (ref 0.1–1.0)
Monocytes Relative: 7 %
Neutro Abs: 4.7 10*3/uL (ref 1.7–7.7)
Neutrophils Relative %: 48 %
Platelet Count: 170 10*3/uL (ref 150–400)
RBC: 3.52 MIL/uL — ABNORMAL LOW (ref 3.87–5.11)
RDW: 14.2 % (ref 11.5–15.5)
Smear Review: NORMAL
WBC Count: 9.8 10*3/uL (ref 4.0–10.5)
nRBC: 0.5 % — ABNORMAL HIGH (ref 0.0–0.2)

## 2022-07-03 LAB — TSH: TSH: 0.921 u[IU]/mL (ref 0.350–4.500)

## 2022-07-03 LAB — CMP (CANCER CENTER ONLY)
ALT: 24 U/L (ref 0–44)
AST: 14 U/L — ABNORMAL LOW (ref 15–41)
Albumin: 4.1 g/dL (ref 3.5–5.0)
Alkaline Phosphatase: 68 U/L (ref 38–126)
Anion gap: 5 (ref 5–15)
BUN: 15 mg/dL (ref 6–20)
CO2: 28 mmol/L (ref 22–32)
Calcium: 9 mg/dL (ref 8.9–10.3)
Chloride: 102 mmol/L (ref 98–111)
Creatinine: 0.7 mg/dL (ref 0.44–1.00)
GFR, Estimated: >60 mL/min (ref >60)
Glucose, Bld: 87 mg/dL (ref 70–99)
Potassium: 3.6 mmol/L (ref 3.5–5.1)
Sodium: 135 mmol/L (ref 135–145)
Total Bilirubin: 0.2 mg/dL — ABNORMAL LOW (ref 0.3–1.2)
Total Protein: 6 g/dL — ABNORMAL LOW (ref 6.5–8.1)

## 2022-07-03 MED ORDER — SODIUM CHLORIDE 0.9 % IV SOLN
169.8000 mg | Freq: Once | INTRAVENOUS | Status: AC
  Administered 2022-07-03: 170 mg via INTRAVENOUS
  Filled 2022-07-03: qty 17

## 2022-07-03 MED ORDER — FAMOTIDINE 20 MG IN NS 100 ML IVPB
20.0000 mg | Freq: Once | INTRAVENOUS | Status: AC
  Administered 2022-07-03: 20 mg via INTRAVENOUS
  Filled 2022-07-03: qty 100

## 2022-07-03 MED ORDER — DIPHENHYDRAMINE HCL 50 MG/ML IJ SOLN
50.0000 mg | Freq: Once | INTRAMUSCULAR | Status: AC
  Administered 2022-07-03: 50 mg via INTRAVENOUS
  Filled 2022-07-03: qty 1

## 2022-07-03 MED ORDER — SODIUM CHLORIDE 0.9 % IV SOLN
80.0000 mg/m2 | Freq: Once | INTRAVENOUS | Status: AC
  Administered 2022-07-03: 132 mg via INTRAVENOUS
  Filled 2022-07-03: qty 22

## 2022-07-03 MED ORDER — SODIUM CHLORIDE 0.9 % IV SOLN
10.0000 mg | Freq: Once | INTRAVENOUS | Status: AC
  Administered 2022-07-03: 10 mg via INTRAVENOUS
  Filled 2022-07-03: qty 10

## 2022-07-03 MED ORDER — PALONOSETRON HCL INJECTION 0.25 MG/5ML
0.2500 mg | Freq: Once | INTRAVENOUS | Status: AC
  Administered 2022-07-03: 0.25 mg via INTRAVENOUS
  Filled 2022-07-03: qty 5

## 2022-07-03 MED ORDER — SODIUM CHLORIDE 0.9 % IV SOLN: Freq: Once | INTRAVENOUS | Status: AC

## 2022-07-03 MED ORDER — SODIUM CHLORIDE 0.9% FLUSH
10.0000 mL | Freq: Once | INTRAVENOUS | Status: AC
  Administered 2022-07-03: 10 mL

## 2022-07-03 MED ORDER — HEPARIN SOD (PORK) LOCK FLUSH 100 UNIT/ML IV SOLN
500.0000 [IU] | Freq: Once | INTRAVENOUS | Status: AC | PRN
  Administered 2022-07-03: 500 [IU]

## 2022-07-03 MED ORDER — SODIUM CHLORIDE 0.9 % IV SOLN
200.0000 mg | Freq: Once | INTRAVENOUS | Status: AC
  Administered 2022-07-03: 200 mg via INTRAVENOUS
  Filled 2022-07-03: qty 200

## 2022-07-03 MED ORDER — SODIUM CHLORIDE 0.9% FLUSH
10.0000 mL | INTRAVENOUS | Status: DC | PRN
  Administered 2022-07-03: 10 mL

## 2022-07-03 NOTE — Patient Instructions (Signed)
[facility]  Discharge Instructions: Thank you for choosing [facility] to provide your oncology and hematology care.   If you have a lab appointment with the Cancer Center, please go directly to the Cancer Center and check in at the registration area.   Wear comfortable clothing and clothing appropriate for easy access to any Portacath or PICC line.   We strive to give you quality time with your provider. You may need to reschedule your appointment if you arrive late (15 or more minutes).  Arriving late affects you and other patients whose appointments are after yours.  Also, if you miss three or more appointments without notifying the office, you may be dismissed from the clinic at the provider's discretion.      For prescription refill requests, have your pharmacy contact our office and allow 72 hours for refills to be completed.    Today you received the following chemotherapy and/or immunotherapy agents: Keytruda, Paclitaxel, Carboplatin.       To help prevent nausea and vomiting after your treatment, we encourage you to take your nausea medication as directed.  BELOW ARE SYMPTOMS THAT SHOULD BE REPORTED IMMEDIATELY: *FEVER GREATER THAN 100.4 F (38 C) OR HIGHER *CHILLS OR SWEATING *NAUSEA AND VOMITING THAT IS NOT CONTROLLED WITH YOUR NAUSEA MEDICATION *UNUSUAL SHORTNESS OF BREATH *UNUSUAL BRUISING OR BLEEDING *URINARY PROBLEMS (pain or burning when urinating, or frequent urination) *BOWEL PROBLEMS (unusual diarrhea, constipation, pain near the anus) TENDERNESS IN MOUTH AND THROAT WITH OR WITHOUT PRESENCE OF ULCERS (sore throat, sores in mouth, or a toothache) UNUSUAL RASH, SWELLING OR PAIN  UNUSUAL VAGINAL DISCHARGE OR ITCHING   Items with * indicate a potential emergency and should be followed up as soon as possible or go to the Emergency Department if any problems should occur.  Please show the CHEMOTHERAPY ALERT CARD or  IMMUNOTHERAPY ALERT CARD at check-in to the Emergency Department and triage nurse.  Should you have questions after your visit or need to cancel or reschedule your appointment, please contact [facility]  Dept: [phone]  and follow the prompts.  Office hours are 8:00 a.m. to 4:30 p.m. Monday - Friday. Please note that voicemails left after 4:00 p.m. may not be returned until the following business day.  We are closed weekends and major holidays. You have access to a nurse at all times for urgent questions. Please call the main number to the clinic Dept: [phone] and follow the prompts.   For any non-urgent questions, you may also contact your provider using MyChart. We now offer e-Visits for anyone 18 and older to request care online for non-urgent symptoms. For details visit [email or web address].   Also download the MyChart app! Go to the app store, search "MyChart", open the app, select [facility], and log in with your MyChart username and password.   

## 2022-07-03 NOTE — Progress Notes (Signed)
[facility] Cancer Follow up:    Vanessa Panda, NP [address] [place] [address]   DIAGNOSIS:  Cancer Staging  Malignant neoplasm of upper-inner quadrant of right breast in female, estrogen receptor positive (HCC) Staging form: Breast, AJCC 8th Edition - Clinical stage from 05/07/2022: Stage IIB (cT2, cN0, cM0, G3, ER+, PR-, HER2-) - Unsigned Stage prefix: Initial diagnosis Histologic grading system: 3 grade system Laterality: Right Staged by: Pathologist and managing physician Stage used in treatment planning: Yes National guidelines used in treatment planning: Yes Type of national guideline used in treatment planning: NCCN   SUMMARY OF ONCOLOGIC HISTORY: Oncology History  Malignant 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   04/25/2022 Pathology Results   Right breast needle core biopsy at 3:00 showed high-grade invasive ductal carcinoma.  Prognostic showed ER 40% positive weak staining intensity, PR 0% negative, group 5 HER2 negative and Ki-67 of 80%.   05/22/2022 -  Chemotherapy   Patient is on Treatment Plan : BREAST Pembrolizumab (200) D1 + Carboplatin (1.5) D1,8,15 + Paclitaxel (80) D1,8,15 q21d X 4 cycles / Pembrolizumab (200) D1 + AC D1 q21d x 4 cycles      Genetic Testing   [facility] Custom Cancer Panel+RNA was Negative. Report date is 05/14/2022.  The Custom Hereditary Cancers Panel offered by [facility] includes sequencing and/or deletion duplication testing of the following 43 genes: APC, ATM, AXIN2, BAP1, BARD1, BMPR1A, BRCA1, BRCA2, BRIP1, CDH1, CDK4, CDKN2A (p14ARF and p16INK4a only), CHEK2, CTNNA1, EPCAM (Deletion/duplication testing only), FH, GREM1 (promoter region duplication testing only), HOXB13, KIT, MBD4, MEN1, MLH1,  MSH2, MSH3, MSH6, MUTYH, NF1, NHTL1, PALB2, PDGFRA, PMS2, POLD1, POLE, PTEN, RAD51C, RAD51D, SMAD4, SMARCA4. STK11, TP53, TSC1, TSC2, and VHL.     CURRENT THERAPY: Carbo, Taxol, Keytruda  INTERVAL HISTORY: Vanessa Murphy 42 y.o. female returns for follow up and evaluation prior to treatment with week #7 of treatment.  She is due to receive Taxol/Carbo/Keytruda today.  She received GCSF daily injections last week and notes that following this she developed erythema on her hands and chest similar to when this occurred after her last injections.  She started on Medrol dosepak and tells me the rash has improved.    She notes that she had some sores in her mouth that solved after taking Mycelex atrocious.  She also has had some achiness in her legs back and hips.  She denies any numbness or tingling in the fingertips or toes.   Patient Active Problem List   Diagnosis Date Noted   Port-A-Cath in place 05/22/2022   Genetic testing 05/15/2022   Family history of breast cancer 05/07/2022   Family history of melanoma 05/07/2022   Malignant neoplasm of upper-inner quadrant of right breast in female, estrogen receptor positive (HCC) 05/06/2022   Adjustment disorder with mixed anxiety and depressed mood 03/18/2019   Anxiety 07/12/2018   Abnormal menstruation 07/23/2006   Intractable migraine 08/05/2004   DDD (degenerative disc disease), cervical 08/05/2004    is allergic to contrast media [iodinated contrast media], venlafaxine, amitriptyline, iodine, methocarbamol, and tartrazine.  MEDICAL HISTORY: Past Medical History:  Diagnosis Date   Cancer [facility])    Migraines     SURGICAL HISTORY: Past Surgical History:  Procedure Laterality Date   BREAST BIOPSY Right 04/25/2022   [place] RT BREAST  BX W LOC DEV 1ST LESION IMG BX SPEC US GUIDE 04/25/2022 [facility] MAMMOGRAPHY   IR IMAGING GUIDED PORT INSERTION  05/21/2022    SOCIAL HISTORY: Social History   Socioeconomic History   Marital status:  Single    Spouse name: Not on file   Number of children: Not on file   Years of education: Not on file   Highest education level: Not on file  Occupational History   Not on file  Tobacco Use   Smoking status: Former    Packs/day: .5    Types: Cigarettes    Quit date: 04/29/2022    Years since quitting: 0.1   Smokeless tobacco: Never  Vaping Use   Vaping Use: Never used  Substance and Sexual Activity   Alcohol use: Yes   Drug use: Never   Sexual activity: Yes    Birth control/protection: Surgical    Comment: MRI 05-16-22 Left Breast Lump found  Other Topics Concern   Not on file  Social History Narrative   Not on file   Social Determinants of Health   Financial Resource Strain: Medium Risk (05/07/2022)   Overall Financial Resource Strain (CARDIA)    Difficulty of Paying Living Expenses: Somewhat hard  Food Insecurity: No Food Insecurity (05/07/2022)   Hunger Vital Sign    Worried About Running Out of Food in the Last Year: Never true    Ran Out of Food in the Last Year: Never true  Transportation Needs: No Transportation Needs (05/07/2022)   PRAPARE - Administrator, Civil Service (Medical): No    Lack of Transportation (Non-Medical): No  Physical Activity: Not on file  Stress: Stress Concern Present (05/07/2022)   [employer] of Occupational Health - Occupational Stress Questionnaire    Feeling of Stress : Very much  Social Connections: Not on file  Intimate Partner Violence: Not on file    FAMILY HISTORY: Family History  Problem Relation Age of Onset   Melanoma Father 26 - 19       dx. again in his early 33s   Breast cancer Paternal Grandmother 54 - 12    Review of Systems  Constitutional:  Positive for fatigue. Negative for appetite change, chills, fever and unexpected weight change.  HENT:   Positive for mouth sores. Negative for hearing loss, lump/mass and trouble swallowing.   Eyes:  Negative for eye problems and icterus.  Respiratory:   Negative for chest tightness, cough and shortness of breath.   Cardiovascular:  Negative for chest pain, leg swelling and palpitations.  Gastrointestinal:  Negative for abdominal distention, abdominal pain, constipation, diarrhea, nausea and vomiting.  Endocrine: Negative for hot flashes.  Genitourinary:  Negative for difficulty urinating.   Musculoskeletal:  Positive for arthralgias.  Skin:  Positive for rash. Negative for itching.  Neurological:  Negative for dizziness, extremity weakness, headaches and numbness.  Hematological:  Negative for adenopathy. Does not bruise/bleed easily.  Psychiatric/Behavioral:  Negative for depression. The patient is not nervous/anxious.       PHYSICAL EXAMINATION    Vitals:   07/03/22 1147  BP: 122/71  Pulse: 69  Resp: 18  Temp: 98.1 F (36.7 C)  SpO2: 98%    Physical Exam Constitutional:      General: She is not in acute distress.    Appearance: Normal appearance. She is not toxic-appearing.  HENT:     Head: Normocephalic and atraumatic.     Mouth/Throat:     Mouth: Mucous membranes are moist.  Pharynx: Oropharynx is clear. No oropharyngeal exudate or posterior oropharyngeal erythema.  Eyes:     General: No scleral icterus. Cardiovascular:     Rate and Rhythm: Normal rate and regular rhythm.     Pulses: Normal pulses.     Heart sounds: Normal heart sounds.  Pulmonary:     Effort: Pulmonary effort is normal.     Breath sounds: Normal breath sounds.  Abdominal:     General: Abdomen is flat. Bowel sounds are normal. There is no distension.     Palpations: Abdomen is soft.     Tenderness: There is no abdominal tenderness.  Musculoskeletal:        General: No swelling.     Cervical back: Neck supple.  Lymphadenopathy:     Cervical: No cervical adenopathy.  Skin:    General: Skin is warm and dry.     Findings: Rash (Erythema noted to hands and chest wall.  There is no visible maculopapular rash.) present.  Neurological:      General: No focal deficit present.     Mental Status: She is alert.  Psychiatric:        Mood and Affect: Mood normal.        Behavior: Behavior normal.     LABORATORY DATA:  CBC    Component Value Date/Time   WBC 9.8 07/03/2022 1115   WBC 6.8 06/24/2022 1352   RBC 3.52 (L) 07/03/2022 1115   HGB 11.3 (L) 07/03/2022 1115   HCT 32.8 (L) 07/03/2022 1115   PLT 170 07/03/2022 1115   MCV 93.2 07/03/2022 1115   MCH 32.1 07/03/2022 1115   MCHC 34.5 07/03/2022 1115   RDW 14.2 07/03/2022 1115   LYMPHSABS 3.0 07/03/2022 1115   MONOABS 0.7 07/03/2022 1115   EOSABS 0.0 07/03/2022 1115   BASOSABS 0.1 07/03/2022 1115    CMP     Component Value Date/Time   NA 135 07/03/2022 1115   K 3.6 07/03/2022 1115   CL 102 07/03/2022 1115   CO2 28 07/03/2022 1115   GLUCOSE 87 07/03/2022 1115   BUN 15 07/03/2022 1115   CREATININE 0.70 07/03/2022 1115   CALCIUM 9.0 07/03/2022 1115   PROT 6.0 (L) 07/03/2022 1115   ALBUMIN 4.1 07/03/2022 1115   AST 14 (L) 07/03/2022 1115   ALT 24 07/03/2022 1115   ALKPHOS 68 07/03/2022 1115   BILITOT 0.2 (L) 07/03/2022 1115   GFRNONAA >60 07/03/2022 1115         ASSESSMENT and THERAPY PLAN:   Malignant neoplasm of upper-inner quadrant of right breast in female, estrogen receptor positive (HCC) Vanessa Murphy is a 42 year old woman with stage IIb right-sided breast cancer here today for follow-up and evaluation prior to receiving neoadjuvant Keytruda, Taxol, carbo.  Stage IIb breast cancer: Her labs are stable today.  She will proceed with treatment. Myalgias: Likely secondary to treatment.  Recommended that she continue tramadol and tizanidine. Fatigue: She is managing this with energy conservation and is able to continue to work. Rash: Medrol Dosepak prescribed earlier this week.  I reviewed this with Dr. Al Pimple who will f/u with her next week when she sees her before treatment.    Vanessa Murphy will return next week for labs, follow-up, and her next  treatment.  All questions were answered. The patient knows to call the clinic with any problems, questions or concerns. We can certainly see the patient much sooner if necessary.  Total encounter time:20 minutes*in face-to-face visit time, chart review, lab review, care coordination, order  entry, and documentation of the encounter time.    Lillard Anes, NP 07/03/22 1:07 PM Medical Oncology and Hematology [facility] [address] [place], [place] [address] Tel. [phone]    Fax. [phone]  *Total Encounter Time as defined by the Centers for Medicare and Medicaid Services includes, in addition to the face-to-face time of a patient visit (documented in the note above) non-face-to-face time: obtaining and reviewing outside history, ordering and reviewing medications, tests or procedures, care coordination (communications with other health care professionals or caregivers) and documentation in the medical record.

## 2022-07-03 NOTE — Assessment & Plan Note (Signed)
Vanessa Murphy is a 42 year old woman with stage IIb right-sided breast cancer here today for follow-up and evaluation prior to receiving neoadjuvant Keytruda, Taxol, carbo.  Stage IIb breast cancer: Her labs are stable today.  She will proceed with treatment. Myalgias: Likely secondary to treatment.  Recommended that she continue tramadol and tizanidine. Fatigue: She is managing this with energy conservation and is able to continue to work. Rash: Medrol Dosepak prescribed earlier this week.  I reviewed this with Dr. Al Pimple who will f/u with her next week when she sees her before treatment.    Vanessa Murphy will return next week for labs, follow-up, and her next treatment.

## 2022-07-04 ENCOUNTER — Encounter: Payer: Self-pay | Admitting: Hematology and Oncology

## 2022-07-04 LAB — T4: T4, Total: 6.9 ug/dL (ref 4.5–12.0)

## 2022-07-06 ENCOUNTER — Encounter: Payer: Self-pay | Admitting: Hematology and Oncology

## 2022-07-08 ENCOUNTER — Encounter: Payer: Self-pay | Admitting: Hematology and Oncology

## 2022-07-08 ENCOUNTER — Telehealth: Payer: Self-pay | Admitting: *Deleted

## 2022-07-08 ENCOUNTER — Other Ambulatory Visit: Payer: Self-pay | Admitting: Hematology and Oncology

## 2022-07-08 MED ORDER — TRAMADOL HCL 50 MG PO TABS: 50.0000 mg | ORAL_TABLET | Freq: Two times a day (BID) | ORAL | 0 refills | Status: DC | PRN

## 2022-07-08 NOTE — Telephone Encounter (Signed)
This RN spoke per returning her VM- Vanessa Murphy states she is having the severe " redness and burning almost like a bad sunburn on both of my hands "  Discomfort is interfering with her sleep and she is using tramadol ( has 3 tablets left ).  She states she is having some mild SOB as well " just feels like I am out of breath a lot."  She states she has sores in her nose.  She is having the general pain in her joints post getting the GSF injections.  She is not sleeping well " and just feel exhausted and ready to stop "  This RN informed her of use of creams for neuropathy - and to use cold compresses.  This note will be forwarded to provider for further recommendations.

## 2022-07-08 NOTE — Progress Notes (Signed)
Tramadol prescription refilled.  Vanessa Murphy

## 2022-07-08 NOTE — Telephone Encounter (Signed)
This RN spoke with pt her call stating she is having " what feels like sunburn on both my hands

## 2022-07-09 ENCOUNTER — Encounter: Payer: Self-pay | Admitting: Hematology and Oncology

## 2022-07-09 MED FILL — Dexamethasone Sodium Phosphate Inj 100 MG/10ML: INTRAMUSCULAR | Qty: 1 | Status: AC

## 2022-07-10 ENCOUNTER — Inpatient Hospital Stay: Payer: BC Managed Care – PPO

## 2022-07-10 ENCOUNTER — Inpatient Hospital Stay (HOSPITAL_BASED_OUTPATIENT_CLINIC_OR_DEPARTMENT_OTHER): Payer: BC Managed Care – PPO | Admitting: Hematology and Oncology

## 2022-07-10 VITALS — BP 141/84 | HR 83 | Temp 97.7°F | Resp 18 | Ht 64.0 in | Wt 141.1 lb

## 2022-07-10 DIAGNOSIS — Z170 Estrogen receptor positive status [ER+]: Secondary | ICD-10-CM

## 2022-07-10 DIAGNOSIS — Z95828 Presence of other vascular implants and grafts: Secondary | ICD-10-CM

## 2022-07-10 DIAGNOSIS — C50211 Malignant neoplasm of upper-inner quadrant of right female breast: Secondary | ICD-10-CM | POA: Diagnosis not present

## 2022-07-10 LAB — CMP (CANCER CENTER ONLY)
ALT: 32 U/L (ref 0–44)
AST: 14 U/L — ABNORMAL LOW (ref 15–41)
Albumin: 4.2 g/dL (ref 3.5–5.0)
Alkaline Phosphatase: 52 U/L (ref 38–126)
Anion gap: 6 (ref 5–15)
BUN: 13 mg/dL (ref 6–20)
CO2: 27 mmol/L (ref 22–32)
Calcium: 9 mg/dL (ref 8.9–10.3)
Chloride: 100 mmol/L (ref 98–111)
Creatinine: 0.48 mg/dL (ref 0.44–1.00)
GFR, Estimated: >60 mL/min (ref >60)
Glucose, Bld: 111 mg/dL — ABNORMAL HIGH (ref 70–99)
Potassium: 3.9 mmol/L (ref 3.5–5.1)
Sodium: 133 mmol/L — ABNORMAL LOW (ref 135–145)
Total Bilirubin: 0.4 mg/dL (ref 0.3–1.2)
Total Protein: 6.5 g/dL (ref 6.5–8.1)

## 2022-07-10 LAB — CBC WITH DIFFERENTIAL (CANCER CENTER ONLY)
Abs Immature Granulocytes: 0.28 10*3/uL — ABNORMAL HIGH (ref 0.00–0.07)
Basophils Absolute: 0 10*3/uL (ref 0.0–0.1)
Basophils Relative: 1 %
Eosinophils Absolute: 0 10*3/uL (ref 0.0–0.5)
Eosinophils Relative: 0 %
HCT: 31.7 % — ABNORMAL LOW (ref 36.0–46.0)
Hemoglobin: 11.1 g/dL — ABNORMAL LOW (ref 12.0–15.0)
Immature Granulocytes: 4 %
Lymphocytes Relative: 15 %
Lymphs Abs: 1.1 10*3/uL (ref 0.7–4.0)
MCH: 32.5 pg (ref 26.0–34.0)
MCHC: 35 g/dL (ref 30.0–36.0)
MCV: 92.7 fL (ref 80.0–100.0)
Monocytes Absolute: 0.2 10*3/uL (ref 0.1–1.0)
Monocytes Relative: 3 %
Neutro Abs: 5.6 10*3/uL (ref 1.7–7.7)
Neutrophils Relative %: 77 %
Platelet Count: 172 10*3/uL (ref 150–400)
RBC: 3.42 MIL/uL — ABNORMAL LOW (ref 3.87–5.11)
RDW: 14.6 % (ref 11.5–15.5)
WBC Count: 7.3 10*3/uL (ref 4.0–10.5)
nRBC: 0 % (ref 0.0–0.2)

## 2022-07-10 MED ORDER — SODIUM CHLORIDE 0.9 % IV SOLN: Freq: Once | INTRAVENOUS | Status: AC

## 2022-07-10 MED ORDER — FAMOTIDINE IN NACL 20-0.9 MG/50ML-% IV SOLN
20.0000 mg | Freq: Once | INTRAVENOUS | Status: AC
  Administered 2022-07-10: 20 mg via INTRAVENOUS
  Filled 2022-07-10: qty 50

## 2022-07-10 MED ORDER — PALONOSETRON HCL INJECTION 0.25 MG/5ML
0.2500 mg | Freq: Once | INTRAVENOUS | Status: AC
  Administered 2022-07-10: 0.25 mg via INTRAVENOUS
  Filled 2022-07-10: qty 5

## 2022-07-10 MED ORDER — HEPARIN SOD (PORK) LOCK FLUSH 100 UNIT/ML IV SOLN
500.0000 [IU] | Freq: Once | INTRAVENOUS | Status: AC | PRN
  Administered 2022-07-10: 500 [IU]

## 2022-07-10 MED ORDER — SODIUM CHLORIDE 0.9 % IV SOLN
169.8000 mg | Freq: Once | INTRAVENOUS | Status: AC
  Administered 2022-07-10: 170 mg via INTRAVENOUS
  Filled 2022-07-10: qty 17

## 2022-07-10 MED ORDER — SODIUM CHLORIDE 0.9% FLUSH
10.0000 mL | INTRAVENOUS | Status: DC | PRN
  Administered 2022-07-10: 10 mL

## 2022-07-10 MED ORDER — SODIUM CHLORIDE 0.9 % IV SOLN
10.0000 mg | Freq: Once | INTRAVENOUS | Status: AC
  Administered 2022-07-10: 10 mg via INTRAVENOUS
  Filled 2022-07-10: qty 10

## 2022-07-10 MED ORDER — SODIUM CHLORIDE 0.9% FLUSH
10.0000 mL | Freq: Once | INTRAVENOUS | Status: AC
  Administered 2022-07-10: 10 mL

## 2022-07-10 MED ORDER — DIPHENHYDRAMINE HCL 50 MG/ML IJ SOLN
50.0000 mg | Freq: Once | INTRAMUSCULAR | Status: AC
  Administered 2022-07-10: 50 mg via INTRAVENOUS
  Filled 2022-07-10: qty 1

## 2022-07-10 NOTE — Patient Instructions (Signed)
[facility]  Discharge Instructions: Thank you for choosing [facility] to provide your oncology and hematology care.   If you have a lab appointment with the Cancer Center, please go directly to the Cancer Center and check in at the registration area.   Wear comfortable clothing and clothing appropriate for easy access to any Portacath or PICC line.   We strive to give you quality time with your provider. You may need to reschedule your appointment if you arrive late (15 or more minutes).  Arriving late affects you and other patients whose appointments are after yours.  Also, if you miss three or more appointments without notifying the office, you may be dismissed from the clinic at the provider's discretion.      For prescription refill requests, have your pharmacy contact our office and allow 72 hours for refills to be completed.    Today you received the following chemotherapy and/or immunotherapy agents Carboplatin.    To help prevent nausea and vomiting after your treatment, we encourage you to take your nausea medication as directed.  BELOW ARE SYMPTOMS THAT SHOULD BE REPORTED IMMEDIATELY: *FEVER GREATER THAN 100.4 F (38 C) OR HIGHER *CHILLS OR SWEATING *NAUSEA AND VOMITING THAT IS NOT CONTROLLED WITH YOUR NAUSEA MEDICATION *UNUSUAL SHORTNESS OF BREATH *UNUSUAL BRUISING OR BLEEDING *URINARY PROBLEMS (pain or burning when urinating, or frequent urination) *BOWEL PROBLEMS (unusual diarrhea, constipation, pain near the anus) TENDERNESS IN MOUTH AND THROAT WITH OR WITHOUT PRESENCE OF ULCERS (sore throat, sores in mouth, or a toothache) UNUSUAL RASH, SWELLING OR PAIN  UNUSUAL VAGINAL DISCHARGE OR ITCHING   Items with * indicate a potential emergency and should be followed up as soon as possible or go to the Emergency Department if any problems should occur.  Please show the CHEMOTHERAPY ALERT CARD or IMMUNOTHERAPY ALERT CARD at  check-in to the Emergency Department and triage nurse.  Should you have questions after your visit or need to cancel or reschedule your appointment, please contact [facility]  Dept: [phone]  and follow the prompts.  Office hours are 8:00 a.m. to 4:30 p.m. Monday - Friday. Please note that voicemails left after 4:00 p.m. may not be returned until the following business day.  We are closed weekends and major holidays. You have access to a nurse at all times for urgent questions. Please call the main number to the clinic Dept: [phone] and follow the prompts.   For any non-urgent questions, you may also contact your provider using MyChart. We now offer e-Visits for anyone 18 and older to request care online for non-urgent symptoms. For details visit [email or web address].   Also download the MyChart app! Go to the app store, search "MyChart", open the app, select [facility], and log in with your MyChart username and password.   

## 2022-07-10 NOTE — Progress Notes (Signed)
[facility] Cancer Follow up:    Vanessa Panda, NP [address] [place] [address]   DIAGNOSIS:  Cancer Staging  Malignant neoplasm of upper-inner quadrant of right breast in female, estrogen receptor positive (HCC) Staging form: Breast, AJCC 8th Edition - Clinical stage from 05/07/2022: Stage IIB (cT2, cN0, cM0, G3, ER+, PR-, HER2-) - Unsigned Stage prefix: Initial diagnosis Histologic grading system: 3 grade system Laterality: Right Staged by: Pathologist and managing physician Stage used in treatment planning: Yes National guidelines used in treatment planning: Yes Type of national guideline used in treatment planning: NCCN   SUMMARY OF ONCOLOGIC HISTORY: Oncology History  Malignant 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   04/25/2022 Pathology Results   Right breast needle core biopsy at 3:00 showed high-grade invasive ductal carcinoma.  Prognostic showed ER 40% positive weak staining intensity, PR 0% negative, group 5 HER2 negative and Ki-67 of 80%.   05/22/2022 -  Chemotherapy   Patient is on Treatment Plan : BREAST Pembrolizumab (200) D1 + Carboplatin (1.5) D1,8,15 + Paclitaxel (80) D1,8,15 q21d X 4 cycles / Pembrolizumab (200) D1 + AC D1 q21d x 4 cycles      Genetic Testing   [facility] Custom Cancer Panel+RNA was Negative. Report date is 05/14/2022.  The Custom Hereditary Cancers Panel offered by [facility] includes sequencing and/or deletion duplication testing of the following 43 genes: APC, ATM, AXIN2, BAP1, BARD1, BMPR1A, BRCA1, BRCA2, BRIP1, CDH1, CDK4, CDKN2A (p14ARF and p16INK4a only), CHEK2, CTNNA1, EPCAM (Deletion/duplication testing only), FH, GREM1 (promoter region duplication testing only), HOXB13, KIT, MBD4, MEN1, MLH1,  MSH2, MSH3, MSH6, MUTYH, NF1, NHTL1, PALB2, PDGFRA, PMS2, POLD1, POLE, PTEN, RAD51C, RAD51D, SMAD4, SMARCA4. STK11, TP53, TSC1, TSC2, and VHL.     CURRENT THERAPY: Carbo, Taxol, Keytruda  INTERVAL HISTORY: Vanessa Murphy 42 y.o. female returns for follow up and evaluation prior to treatment with week #7 of treatment.    She is due to receive Taxol/Carbo/Keytruda today.  She received GCSF daily injections last week and notes that following this she developed erythema on her hands and chest similar to when this occurred after her last injections.  She started on Medrol dosepak and tells me the rash has improved.    She notes that she had some sores in her mouth that solved after taking Mycelex atrocious.  She also has had some achiness in her legs back and hips.  She denies any numbness or tingling in the fingertips or toes.   Patient Active Problem List   Diagnosis Date Noted   Port-A-Cath in place 05/22/2022   Genetic testing 05/15/2022   Family history of breast cancer 05/07/2022   Family history of melanoma 05/07/2022   Malignant neoplasm of upper-inner quadrant of right breast in female, estrogen receptor positive (HCC) 05/06/2022   Adjustment disorder with mixed anxiety and depressed mood 03/18/2019   Anxiety 07/12/2018   Abnormal menstruation 07/23/2006   Intractable migraine 08/05/2004   DDD (degenerative disc disease), cervical 08/05/2004    is allergic to contrast media [iodinated contrast media], venlafaxine, amitriptyline, iodine, methocarbamol, and tartrazine.  MEDICAL HISTORY: Past Medical History:  Diagnosis Date   Cancer [facility])    Migraines     SURGICAL HISTORY: Past Surgical History:  Procedure Laterality Date   BREAST BIOPSY Right 04/25/2022   [place]  RT BREAST BX W LOC DEV 1ST LESION IMG BX SPEC US GUIDE 04/25/2022 [facility] MAMMOGRAPHY   IR IMAGING GUIDED PORT INSERTION  05/21/2022    SOCIAL HISTORY: Social History   Socioeconomic History   Marital status:  Single    Spouse name: Not on file   Number of children: Not on file   Years of education: Not on file   Highest education level: Not on file  Occupational History   Not on file  Tobacco Use   Smoking status: Former    Packs/day: .5    Types: Cigarettes    Quit date: 04/29/2022    Years since quitting: 0.1   Smokeless tobacco: Never  Vaping Use   Vaping Use: Never used  Substance and Sexual Activity   Alcohol use: Yes   Drug use: Never   Sexual activity: Yes    Birth control/protection: Surgical    Comment: MRI 05-16-22 Left Breast Lump found  Other Topics Concern   Not on file  Social History Narrative   Not on file   Social Determinants of Health   Financial Resource Strain: Medium Risk (05/07/2022)   Overall Financial Resource Strain (CARDIA)    Difficulty of Paying Living Expenses: Somewhat hard  Food Insecurity: No Food Insecurity (05/07/2022)   Hunger Vital Sign    Worried About Running Out of Food in the Last Year: Never true    Ran Out of Food in the Last Year: Never true  Transportation Needs: No Transportation Needs (05/07/2022)   PRAPARE - Administrator, Civil Service (Medical): No    Lack of Transportation (Non-Medical): No  Physical Activity: Not on file  Stress: Stress Concern Present (05/07/2022)   [employer] of Occupational Health - Occupational Stress Questionnaire    Feeling of Stress : Very much  Social Connections: Not on file  Intimate Partner Violence: Not on file    FAMILY HISTORY: Family History  Problem Relation Age of Onset   Melanoma Father 55 - 46       dx. again in his early 39s   Breast cancer Paternal Grandmother 17 - 68    Review of Systems  Constitutional:  Positive for fatigue. Negative for appetite change, chills, fever and unexpected weight change.  HENT:   Positive for mouth sores. Negative for hearing loss, lump/mass and trouble swallowing.   Eyes:  Negative for eye problems and icterus.  Respiratory:   Negative for chest tightness, cough and shortness of breath.   Cardiovascular:  Negative for chest pain, leg swelling and palpitations.  Gastrointestinal:  Negative for abdominal distention, abdominal pain, constipation, diarrhea, nausea and vomiting.  Endocrine: Negative for hot flashes.  Genitourinary:  Negative for difficulty urinating.   Musculoskeletal:  Positive for arthralgias.  Skin:  Positive for rash. Negative for itching.  Neurological:  Negative for dizziness, extremity weakness, headaches and numbness.  Hematological:  Negative for adenopathy. Does not bruise/bleed easily.  Psychiatric/Behavioral:  Negative for depression. The patient is not nervous/anxious.       PHYSICAL EXAMINATION    Vitals:   07/10/22 0842  BP: (!) 141/84  Pulse: 83  Resp: 18  Temp: 97.7 F (36.5 C)  SpO2: 100%    Physical Exam Constitutional:      General: She is not in acute distress.    Appearance: Normal appearance. She is not toxic-appearing.  HENT:     Head: Normocephalic and atraumatic.     Mouth/Throat:     Pharynx: No oropharyngeal exudate  or posterior oropharyngeal erythema.  Eyes:     General: No scleral icterus. Pulmonary:     Effort: Pulmonary effort is normal.     Breath sounds: Normal breath sounds.  Chest:     Comments: Right breast lower inner quadrant mass non palpable. Abnormal area of density but nothing well defined consistent with clinical response. No regional adenopathy. Abdominal:     General: Bowel sounds are normal. There is no distension.     Tenderness: There is no abdominal tenderness.  Musculoskeletal:        General: No swelling.     Cervical back: Neck supple.  Lymphadenopathy:     Cervical: No cervical adenopathy.  Skin:    Findings: Rash (blotchy maculopapular rash with skin desquamation noted on hands and forearms, only sunexposed areas.) present.  Neurological:     General: No focal deficit present.     Mental Status: She is alert.   Psychiatric:        Mood and Affect: Mood normal.        Behavior: Behavior normal.     LABORATORY DATA:  CBC    Component Value Date/Time   WBC 7.3 07/10/2022 0806   WBC 6.8 06/24/2022 1352   RBC 3.42 (L) 07/10/2022 0806   HGB 11.1 (L) 07/10/2022 0806   HCT 31.7 (L) 07/10/2022 0806   PLT 172 07/10/2022 0806   MCV 92.7 07/10/2022 0806   MCH 32.5 07/10/2022 0806   MCHC 35.0 07/10/2022 0806   RDW 14.6 07/10/2022 0806   LYMPHSABS 1.1 07/10/2022 0806   MONOABS 0.2 07/10/2022 0806   EOSABS 0.0 07/10/2022 0806   BASOSABS 0.0 07/10/2022 0806    CMP     Component Value Date/Time   NA 133 (L) 07/10/2022 0806   K 3.9 07/10/2022 0806   CL 100 07/10/2022 0806   CO2 27 07/10/2022 0806   GLUCOSE 111 (H) 07/10/2022 0806   BUN 13 07/10/2022 0806   CREATININE 0.48 07/10/2022 0806   CALCIUM 9.0 07/10/2022 0806   PROT 6.5 07/10/2022 0806   ALBUMIN 4.2 07/10/2022 0806   AST 14 (L) 07/10/2022 0806   ALT 32 07/10/2022 0806   ALKPHOS 52 07/10/2022 0806   BILITOT 0.4 07/10/2022 0806   GFRNONAA >60 07/10/2022 0806         ASSESSMENT and THERAPY PLAN:   Malignant neoplasm of upper-inner quadrant of right breast in female, estrogen receptor positive (HCC) This is a very pleasant 42 year old female patient with newly diagnosed right breast high-grade invasive ductal carcinoma, ER +40% weak staining, PR 0% negative, HER2 negative, Ki-67 of 80% referred to breast [facility] for additional recommendations.  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  With tumor size larger than 2 cm, we have discussed about considering neoadjuvant chemotherapy based on keynote 522 study.  Other option would be to do neoadjuvant non anthracycline-based regimen since she has node-negative disease.   Given high-grade, high proliferation index and weak staining of estrogen receptor, I do believe this is likely to behave like a triple negative  breast cancer. 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  We did discuss that if she is uncomfortable with the option to treated like a triple negative breast cancer we can certainly try to omit immunotherapy and adjust chemotherapy regimen.    She is here for a follow up  before her planned cycle of weekly carbotaxol. Breast mass non palpable, consistent with response. Will omit taxol, she has rash from taxol more than likely. Since there is a question if this rash is related to GCSF, we omitted this as well.  #2, Bone pain from GCSF Ok to use tramadol PRN  #3 Skin rash, grade 1. No more steroids. Ok to complete current medrol dose pak Barrier method to avoid sun exposure and continue moisturizing.   All questions were answered. The patient knows to call the clinic with any problems, questions or concerns. We can certainly see the patient much sooner if necessary.  Total encounter time:30 minutes*in face-to-face visit time, chart review, lab review, care coordination, order entry, and documentation of the encounter time.    *Total Encounter Time as defined by the Centers for Medicare and Medicaid Services includes, in addition to the face-to-face time of a patient visit (documented in the note above) non-face-to-face time: obtaining and reviewing outside history, ordering and reviewing medications, tests or procedures, care coordination (communications with other health care professionals or caregivers) and documentation in the medical record.

## 2022-07-10 NOTE — Assessment & Plan Note (Addendum)
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  With tumor size larger than 2 cm, we have discussed about considering neoadjuvant chemotherapy based on keynote 522 study.  Other option would be to do neoadjuvant non anthracycline-based regimen since she has node-negative disease.   Given high-grade, high proliferation index and weak staining of estrogen receptor, I do believe this is likely to behave like a triple negative breast cancer. 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  We did discuss that if she is uncomfortable with the option to treated like a triple negative breast cancer we can certainly try to omit immunotherapy and adjust chemotherapy regimen.    She is here for a follow up before her planned cycle of weekly carbotaxol. Breast mass non palpable, consistent with response. Will omit taxol, she has rash from taxol more than likely. Since there is a question if this rash is related to GCSF, we omitted this as well.  #2, Bone pain from GCSF Ok to use tramadol PRN  #3 Skin rash, grade 1. No more steroids. Ok to complete current medrol dose pak Barrier method to avoid sun exposure and continue moisturizing.

## 2022-07-11 ENCOUNTER — Encounter: Payer: Self-pay | Admitting: *Deleted

## 2022-07-12 ENCOUNTER — Other Ambulatory Visit: Payer: Self-pay | Admitting: Adult Health

## 2022-07-14 ENCOUNTER — Encounter: Payer: Self-pay | Admitting: Hematology and Oncology

## 2022-07-14 ENCOUNTER — Telehealth: Payer: Self-pay | Admitting: Hematology and Oncology

## 2022-07-14 NOTE — Telephone Encounter (Signed)
Scheduled appointment per WQ. Patient is aware of the made appointments.  

## 2022-07-15 ENCOUNTER — Other Ambulatory Visit: Payer: Self-pay

## 2022-07-15 ENCOUNTER — Other Ambulatory Visit: Payer: Self-pay | Admitting: *Deleted

## 2022-07-15 ENCOUNTER — Other Ambulatory Visit: Payer: Self-pay | Admitting: Hematology and Oncology

## 2022-07-15 ENCOUNTER — Encounter: Payer: Self-pay | Admitting: Hematology and Oncology

## 2022-07-15 ENCOUNTER — Telehealth: Payer: Self-pay | Admitting: *Deleted

## 2022-07-15 ENCOUNTER — Inpatient Hospital Stay: Payer: BC Managed Care – PPO | Attending: Hematology and Oncology

## 2022-07-15 VITALS — BP 118/79 | HR 74 | Temp 98.8°F | Resp 12

## 2022-07-15 DIAGNOSIS — Z87891 Personal history of nicotine dependence: Secondary | ICD-10-CM | POA: Insufficient documentation

## 2022-07-15 DIAGNOSIS — Z803 Family history of malignant neoplasm of breast: Secondary | ICD-10-CM | POA: Insufficient documentation

## 2022-07-15 DIAGNOSIS — Z5111 Encounter for antineoplastic chemotherapy: Secondary | ICD-10-CM | POA: Diagnosis present

## 2022-07-15 DIAGNOSIS — C50211 Malignant neoplasm of upper-inner quadrant of right female breast: Secondary | ICD-10-CM | POA: Insufficient documentation

## 2022-07-15 DIAGNOSIS — Z7952 Long term (current) use of systemic steroids: Secondary | ICD-10-CM | POA: Insufficient documentation

## 2022-07-15 DIAGNOSIS — Z95828 Presence of other vascular implants and grafts: Secondary | ICD-10-CM

## 2022-07-15 DIAGNOSIS — Z808 Family history of malignant neoplasm of other organs or systems: Secondary | ICD-10-CM | POA: Insufficient documentation

## 2022-07-15 DIAGNOSIS — Z170 Estrogen receptor positive status [ER+]: Secondary | ICD-10-CM | POA: Insufficient documentation

## 2022-07-15 DIAGNOSIS — M25579 Pain in unspecified ankle and joints of unspecified foot: Secondary | ICD-10-CM | POA: Diagnosis not present

## 2022-07-15 DIAGNOSIS — Z79899 Other long term (current) drug therapy: Secondary | ICD-10-CM | POA: Insufficient documentation

## 2022-07-15 DIAGNOSIS — Z452 Encounter for adjustment and management of vascular access device: Secondary | ICD-10-CM | POA: Diagnosis not present

## 2022-07-15 LAB — SEDIMENTATION RATE: Sed Rate: 11 mm/hr (ref 0–22)

## 2022-07-15 MED ORDER — PREDNISONE 5 MG PO TABS: 10.0000 mg | ORAL_TABLET | Freq: Every day | ORAL | 1 refills | Status: DC

## 2022-07-15 MED ORDER — HEPARIN SOD (PORK) LOCK FLUSH 100 UNIT/ML IV SOLN
500.0000 [IU] | Freq: Once | INTRAVENOUS | Status: AC
  Administered 2022-07-15: 500 [IU]

## 2022-07-15 MED ORDER — MORPHINE SULFATE (PF) 2 MG/ML IV SOLN
2.0000 mg | Freq: Once | INTRAVENOUS | Status: AC
  Administered 2022-07-15: 2 mg via INTRAVENOUS
  Filled 2022-07-15: qty 1

## 2022-07-15 MED ORDER — SODIUM CHLORIDE 0.9 % IV SOLN: INTRAVENOUS | Status: DC

## 2022-07-15 MED ORDER — SODIUM CHLORIDE 0.9% FLUSH
10.0000 mL | Freq: Once | INTRAVENOUS | Status: AC
  Administered 2022-07-15: 10 mL

## 2022-07-15 MED ORDER — ZOLPIDEM TARTRATE 5 MG PO TABS: 5.0000 mg | ORAL_TABLET | Freq: Every evening | ORAL | 0 refills | Status: DC | PRN

## 2022-07-15 NOTE — Progress Notes (Signed)
Treatment plan modified because of poor tolerance, likely arthralgias from immunotherapy.

## 2022-07-15 NOTE — Progress Notes (Signed)
Patient informed of prednisone and Ambien prescriptions called into her pharmacy.  Patient advised to present to ED if pain becomes intolerable per Dr. Al Pimple.

## 2022-07-15 NOTE — Telephone Encounter (Signed)
Per MD review of pain occurring post last treatment- obtained IV order for morphine to be given today with IVF's.  Low dose steroid order given for 10 mg prednisone for possible benefit.  Sent to pt's known pharmacy.

## 2022-07-15 NOTE — Telephone Encounter (Signed)
No entry 

## 2022-07-15 NOTE — Patient Instructions (Signed)

## 2022-07-15 NOTE — Telephone Encounter (Signed)
This RN spoke with Vanessa Murphy per her My Chart message regarding pain issues.   She states the pain started Saturday pm in her ankles ( she took her last dose of the Medrol dose pak that am ) and then continued with greater severity and becoming more body wide.  She has been using the Tramadol but states she does not notice any benefit with use.  She does state the steroids help significantly with her hands and rash.  Ruthanne states she has not been able to sleep well due to pain and wonders if interruption in sleep is worsening the pain and her body's immune response.  Plan for today is for Vanessa Murphy to come in at 1 pm for IVF and IV pain medication (if she has driver) - This RN place orders for IVF- will have MD review for additional orders for pain meds and home steroids if appropriate.

## 2022-07-15 NOTE — Progress Notes (Signed)
Per MD - repeat order for morphine obtained due to pain still present post prior dose.

## 2022-07-16 ENCOUNTER — Encounter: Payer: Self-pay | Admitting: Hematology and Oncology

## 2022-07-16 MED FILL — Dexamethasone Sodium Phosphate Inj 100 MG/10ML: INTRAMUSCULAR | Qty: 1 | Status: AC

## 2022-07-17 ENCOUNTER — Inpatient Hospital Stay: Payer: BC Managed Care – PPO

## 2022-07-17 ENCOUNTER — Inpatient Hospital Stay (HOSPITAL_BASED_OUTPATIENT_CLINIC_OR_DEPARTMENT_OTHER): Payer: BC Managed Care – PPO | Admitting: Hematology and Oncology

## 2022-07-17 ENCOUNTER — Other Ambulatory Visit: Payer: Self-pay

## 2022-07-17 DIAGNOSIS — C50211 Malignant neoplasm of upper-inner quadrant of right female breast: Secondary | ICD-10-CM | POA: Diagnosis not present

## 2022-07-17 DIAGNOSIS — Z170 Estrogen receptor positive status [ER+]: Secondary | ICD-10-CM

## 2022-07-17 DIAGNOSIS — Z95828 Presence of other vascular implants and grafts: Secondary | ICD-10-CM

## 2022-07-17 LAB — CMP (CANCER CENTER ONLY)
ALT: 29 U/L (ref 0–44)
AST: 18 U/L (ref 15–41)
Albumin: 3.8 g/dL (ref 3.5–5.0)
Alkaline Phosphatase: 48 U/L (ref 38–126)
Anion gap: 5 (ref 5–15)
BUN: 9 mg/dL (ref 6–20)
CO2: 27 mmol/L (ref 22–32)
Calcium: 9 mg/dL (ref 8.9–10.3)
Chloride: 107 mmol/L (ref 98–111)
Creatinine: 0.55 mg/dL (ref 0.44–1.00)
GFR, Estimated: >60 mL/min (ref >60)
Glucose, Bld: 96 mg/dL (ref 70–99)
Potassium: 4 mmol/L (ref 3.5–5.1)
Sodium: 139 mmol/L (ref 135–145)
Total Bilirubin: 0.4 mg/dL (ref 0.3–1.2)
Total Protein: 6 g/dL — ABNORMAL LOW (ref 6.5–8.1)

## 2022-07-17 LAB — CBC WITH DIFFERENTIAL (CANCER CENTER ONLY)
Abs Immature Granulocytes: 0.05 10*3/uL (ref 0.00–0.07)
Basophils Absolute: 0 10*3/uL (ref 0.0–0.1)
Basophils Relative: 0 %
Eosinophils Absolute: 0 10*3/uL (ref 0.0–0.5)
Eosinophils Relative: 1 %
HCT: 32.9 % — ABNORMAL LOW (ref 36.0–46.0)
Hemoglobin: 11 g/dL — ABNORMAL LOW (ref 12.0–15.0)
Immature Granulocytes: 1 %
Lymphocytes Relative: 36 %
Lymphs Abs: 2.1 10*3/uL (ref 0.7–4.0)
MCH: 32.3 pg (ref 26.0–34.0)
MCHC: 33.4 g/dL (ref 30.0–36.0)
MCV: 96.5 fL (ref 80.0–100.0)
Monocytes Absolute: 0.5 10*3/uL (ref 0.1–1.0)
Monocytes Relative: 9 %
Neutro Abs: 3 10*3/uL (ref 1.7–7.7)
Neutrophils Relative %: 53 %
Platelet Count: 196 10*3/uL (ref 150–400)
RBC: 3.41 MIL/uL — ABNORMAL LOW (ref 3.87–5.11)
RDW: 16.6 % — ABNORMAL HIGH (ref 11.5–15.5)
WBC Count: 5.7 10*3/uL (ref 4.0–10.5)
nRBC: 0 % (ref 0.0–0.2)

## 2022-07-17 LAB — HIGH SENSITIVITY CRP: CRP, High Sensitivity: 6.22 mg/L — ABNORMAL HIGH (ref 0.00–3.00)

## 2022-07-17 MED ORDER — HYDROCODONE-ACETAMINOPHEN 5-325 MG PO TABS: 1.0000 | ORAL_TABLET | Freq: Three times a day (TID) | ORAL | 0 refills | Status: DC | PRN

## 2022-07-17 MED ORDER — PREDNISONE 5 MG PO TABS: 10.0000 mg | ORAL_TABLET | Freq: Every day | ORAL | 1 refills | Status: DC

## 2022-07-17 MED ORDER — SODIUM CHLORIDE 0.9% FLUSH
10.0000 mL | Freq: Once | INTRAVENOUS | Status: AC
  Administered 2022-07-17: 10 mL

## 2022-07-17 NOTE — Progress Notes (Signed)
[facility] Cancer Follow up:    Vanessa Panda, NP [address] [place] [address]   DIAGNOSIS:  Cancer Staging  Malignant neoplasm of upper-inner quadrant of right breast in female, estrogen receptor positive (HCC) Staging form: Breast, AJCC 8th Edition - Clinical stage from 05/07/2022: Stage IIB (cT2, cN0, cM0, G3, ER+, PR-, HER2-) - Unsigned Stage prefix: Initial diagnosis Histologic grading system: 3 grade system Laterality: Right Staged by: Pathologist and managing physician Stage used in treatment planning: Yes National guidelines used in treatment planning: Yes Type of national guideline used in treatment planning: NCCN   SUMMARY OF ONCOLOGIC HISTORY: Oncology History  Malignant 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   04/25/2022 Pathology Results   Right breast needle core biopsy at 3:00 showed high-grade invasive ductal carcinoma.  Prognostic showed ER 40% positive weak staining intensity, PR 0% negative, group 5 HER2 negative and Ki-67 of 80%.   05/22/2022 -  Chemotherapy   Patient is on Treatment Plan : BREAST Pembrolizumab (200) D1 + Carboplatin (1.5) D1,8,15 + Paclitaxel (80) D1,8,15 q21d X 4 cycles / Pembrolizumab (200) D1 + AC D1 q21d x 4 cycles      Genetic Testing   [facility] Custom Cancer Panel+RNA was Negative. Report date is 05/14/2022.  The Custom Hereditary Cancers Panel offered by [facility] includes sequencing and/or deletion duplication testing of the following 43 genes: APC, ATM, AXIN2, BAP1, BARD1, BMPR1A, BRCA1, BRCA2, BRIP1, CDH1, CDK4, CDKN2A (p14ARF and p16INK4a only), CHEK2, CTNNA1, EPCAM (Deletion/duplication testing only), FH, GREM1 (promoter region duplication testing only), HOXB13, KIT, MBD4, MEN1, MLH1,  MSH2, MSH3, MSH6, MUTYH, NF1, NHTL1, PALB2, PDGFRA, PMS2, POLD1, POLE, PTEN, RAD51C, RAD51D, SMAD4, SMARCA4. STK11, TP53, TSC1, TSC2, and VHL.     CURRENT THERAPY: Carbo, Taxol, Keytruda  INTERVAL HISTORY:  Vanessa Murphy 42 y.o. female returns for follow up.  Since her last visit here, she continues to complain of severe arthralgias, pain is out of control, tramadol has not been helping.  Her ankles are swollen.  She has not been sleeping well because of uncontrolled pain.  She has been taking prednisone 10 mg but has not noticed much difference. Rash is improving however, breast mass still remains non palpable. Rest of the pertinent 10 point ROS reviewed and neg.   Patient Active Problem List   Diagnosis Date Noted   Port-A-Cath in place 05/22/2022   Genetic testing 05/15/2022   Family history of breast cancer 05/07/2022   Family history of melanoma 05/07/2022   Malignant neoplasm of upper-inner quadrant of right breast in female, estrogen receptor positive (HCC) 05/06/2022   Adjustment disorder with mixed anxiety and depressed mood 03/18/2019   Anxiety 07/12/2018   Abnormal menstruation 07/23/2006   Intractable migraine 08/05/2004   DDD (degenerative disc disease), cervical 08/05/2004    is allergic to contrast media [iodinated contrast media], venlafaxine, amitriptyline, iodine, methocarbamol, and tartrazine.  MEDICAL HISTORY: Past Medical History:  Diagnosis Date   Cancer [facility])    Migraines     SURGICAL HISTORY: Past Surgical History:  Procedure Laterality Date   BREAST BIOPSY Right 04/25/2022   [place] RT BREAST BX W LOC DEV 1ST LESION IMG BX SPEC US GUIDE 04/25/2022 [facility] MAMMOGRAPHY   IR IMAGING GUIDED PORT INSERTION  05/21/2022    SOCIAL HISTORY: Social History  Socioeconomic History   Marital status: Single    Spouse name: Not on file   Number of children: Not on file   Years of education: Not on file   Highest education level: Not on file  Occupational  History   Not on file  Tobacco Use   Smoking status: Former    Packs/day: .5    Types: Cigarettes    Quit date: 04/29/2022    Years since quitting: 0.2   Smokeless tobacco: Never  Vaping Use   Vaping Use: Never used  Substance and Sexual Activity   Alcohol use: Yes   Drug use: Never   Sexual activity: Yes    Birth control/protection: Surgical    Comment: MRI 05-16-22 Left Breast Lump found  Other Topics Concern   Not on file  Social History Narrative   Not on file   Social Determinants of Health   Financial Resource Strain: Medium Risk (05/07/2022)   Overall Financial Resource Strain (CARDIA)    Difficulty of Paying Living Expenses: Somewhat hard  Food Insecurity: No Food Insecurity (05/07/2022)   Hunger Vital Sign    Worried About Running Out of Food in the Last Year: Never true    Ran Out of Food in the Last Year: Never true  Transportation Needs: No Transportation Needs (05/07/2022)   PRAPARE - Administrator, Civil Service (Medical): No    Lack of Transportation (Non-Medical): No  Physical Activity: Not on file  Stress: Stress Concern Present (05/07/2022)   [employer] of Occupational Health - Occupational Stress Questionnaire    Feeling of Stress : Very much  Social Connections: Not on file  Intimate Partner Violence: Not on file    FAMILY HISTORY: Family History  Problem Relation Age of Onset   Melanoma Father 31 - 14       dx. again in his early 60s   Breast cancer Paternal Grandmother 75 - 52    Review of Systems  Constitutional:  Positive for fatigue. Negative for appetite change, chills, fever and unexpected weight change.  HENT:   Negative for hearing loss, lump/mass, mouth sores and trouble swallowing.   Eyes:  Negative for eye problems and icterus.  Respiratory:  Negative for chest tightness, cough and shortness of breath.   Cardiovascular:  Negative for chest pain, leg swelling and palpitations.  Gastrointestinal:  Negative for  abdominal distention, abdominal pain, constipation, diarrhea, nausea and vomiting.  Endocrine: Negative for hot flashes.  Genitourinary:  Negative for difficulty urinating.   Musculoskeletal:  Positive for arthralgias.  Skin:  Positive for rash. Negative for itching.  Neurological:  Negative for dizziness, extremity weakness, headaches and numbness.  Hematological:  Negative for adenopathy. Does not bruise/bleed easily.  Psychiatric/Behavioral:  Negative for depression. The patient is not nervous/anxious.       PHYSICAL EXAMINATION    Vitals:   07/17/22 0938  BP: (!) 147/99  Pulse: 75  Resp: 16  Temp: 98.1 F (36.7 C)  SpO2: 100%    Physical Exam Constitutional:      General: She is not in acute distress.    Appearance: Normal appearance. She is not toxic-appearing.  HENT:     Head: Normocephalic and atraumatic.     Mouth/Throat:     Pharynx: No oropharyngeal exudate or posterior oropharyngeal erythema.  Eyes:     General: No scleral icterus. Pulmonary:     Effort: Pulmonary effort is normal.     Breath sounds: Normal breath sounds.  Chest:  Comments: Right breast lower inner quadrant mass non palpable. Abnormal area of density but nothing well defined consistent with clinical response. No regional adenopathy. Abdominal:     General: Bowel sounds are normal. There is no distension.     Tenderness: There is no abdominal tenderness.  Musculoskeletal:        General: Swelling present.     Cervical back: Neck supple.  Lymphadenopathy:     Cervical: No cervical adenopathy.  Skin:    Findings: Rash (rash has improved) present.  Neurological:     General: No focal deficit present.     Mental Status: She is alert.  Psychiatric:        Mood and Affect: Mood normal.        Behavior: Behavior normal.     LABORATORY DATA:  CBC    Component Value Date/Time   WBC 5.7 07/17/2022 0913   WBC 6.8 06/24/2022 1352   RBC 3.41 (L) 07/17/2022 0913   HGB 11.0 (L)  07/17/2022 0913   HCT 32.9 (L) 07/17/2022 0913   PLT 196 07/17/2022 0913   MCV 96.5 07/17/2022 0913   MCH 32.3 07/17/2022 0913   MCHC 33.4 07/17/2022 0913   RDW 16.6 (H) 07/17/2022 0913   LYMPHSABS 2.1 07/17/2022 0913   MONOABS 0.5 07/17/2022 0913   EOSABS 0.0 07/17/2022 0913   BASOSABS 0.0 07/17/2022 0913    CMP     Component Value Date/Time   NA 139 07/17/2022 0913   K 4.0 07/17/2022 0913   CL 107 07/17/2022 0913   CO2 27 07/17/2022 0913   GLUCOSE 96 07/17/2022 0913   BUN 9 07/17/2022 0913   CREATININE 0.55 07/17/2022 0913   CALCIUM 9.0 07/17/2022 0913   PROT 6.0 (L) 07/17/2022 0913   ALBUMIN 3.8 07/17/2022 0913   AST 18 07/17/2022 0913   ALT 29 07/17/2022 0913   ALKPHOS 48 07/17/2022 0913   BILITOT 0.4 07/17/2022 0913   GFRNONAA >60 07/17/2022 0913    ASSESSMENT and THERAPY PLAN:   Malignant neoplasm of upper-inner quadrant of right breast in female, estrogen receptor positive (HCC) This is a very pleasant 42 year old female patient with newly diagnosed right breast high-grade invasive ductal carcinoma, ER +40% weak staining, PR 0% negative, HER2 negative, Ki-67 of 80% referred to breast M[facility] for additional recommendations.  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  With tumor size larger than 2 cm, we have discussed about considering neoadjuvant chemotherapy based on keynote 522 study.  Other option would be to do neoadjuvant non anthracycline-based regimen since she has node-negative disease.   Given high-grade, high proliferation index and weak staining of estrogen receptor, I do believe this is likely to behave like a triple negative breast cancer. 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  We did discuss that if she is uncomfortable with the option to treated like a triple negative breast cancer we can  certainly try to omit immunotherapy and adjust chemotherapy regimen.    Initially she had skin rash related issues and mild arthralgias hence we try to treat it while continuing Taxol.  Eventually we omitted Taxol and G-CSF.  She continued to have arthralgias, skin rashes improved.  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  I have discontinued immunotherapy since she states the pain is moderate to severe.  I have  started her on prednisone 10 mg for the past week, she states there is no improvement hence have asked her to double the dose to 20 mg daily.  I have also consulted rheumatology and sent an in basket message to them to see if they can accommodate her.  In the meantime I have discussed that we should probably proceed with next line of treatment which is Adriamycin and cyclophosphamide without Keytruda, we will omit treatment today since she is in a lot of pain and she is recovering.  For pain control, she request something stronger than tramadol hence have filled her short prescription of hydrocodone every 8 hours as needed for pain.  All questions were answered. The patient knows to call the clinic with any problems, questions or concerns. We can certainly see the patient much sooner if necessary.  Total encounter time:30 minutes*in face-to-face visit time, chart review, lab review, care coordination, order entry, and documentation of the encounter time.    *Total Encounter Time as defined by the Centers for Medicare and Medicaid Services includes, in addition to the face-to-face time of a patient visit (documented in the note above) non-face-to-face time: obtaining and reviewing outside history, ordering and reviewing medications, tests or procedures, care coordination (communications with other health care professionals or caregivers) and documentation in the medical record.

## 2022-07-17 NOTE — Assessment & Plan Note (Signed)
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  With tumor size larger than 2 cm, we have discussed about considering neoadjuvant chemotherapy based on keynote 522 study.  Other option would be to do neoadjuvant non anthracycline-based regimen since she has node-negative disease.   Given high-grade, high proliferation index and weak staining of estrogen receptor, I do believe this is likely to behave like a triple negative breast cancer. 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  We did discuss that if she is uncomfortable with the option to treated like a triple negative breast cancer we can certainly try to omit immunotherapy and adjust chemotherapy regimen.    Initially she had skin rash related issues and mild arthralgias hence we try to treat it while continuing Taxol.  Eventually we omitted Taxol and G-CSF.  She continued to have arthralgias, skin rashes improved.  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  I have discontinued immunotherapy since she states the pain is moderate to severe.  I have started her on prednisone 10 mg for the past week, she states there is no improvement hence have asked her to double the dose to 20 mg daily.  I have also consulted rheumatology and sent an in basket message to them to see if they can accommodate her.  In the meantime I have discussed that we should probably proceed with next line of treatment which is Adriamycin and cyclophosphamide without Keytruda, we will omit treatment today since she is in a lot of pain and she is  recovering.  For pain control, she request something stronger than tramadol hence have filled her short prescription of hydrocodone every 8 hours as needed for pain.

## 2022-07-18 ENCOUNTER — Ambulatory Visit: Payer: BC Managed Care – PPO

## 2022-07-19 ENCOUNTER — Inpatient Hospital Stay: Payer: BC Managed Care – PPO

## 2022-07-21 ENCOUNTER — Ambulatory Visit: Payer: BC Managed Care – PPO

## 2022-07-23 ENCOUNTER — Other Ambulatory Visit: Payer: Self-pay

## 2022-07-23 ENCOUNTER — Encounter: Payer: Self-pay | Admitting: Hematology and Oncology

## 2022-07-23 MED ORDER — PREDNISONE 5 MG PO TABS: 20.0000 mg | ORAL_TABLET | Freq: Every day | ORAL | 0 refills | Status: DC

## 2022-07-24 ENCOUNTER — Encounter: Payer: Self-pay | Admitting: Hematology and Oncology

## 2022-07-24 ENCOUNTER — Inpatient Hospital Stay: Payer: BC Managed Care – PPO

## 2022-07-24 ENCOUNTER — Other Ambulatory Visit: Payer: Self-pay

## 2022-07-24 ENCOUNTER — Inpatient Hospital Stay (HOSPITAL_BASED_OUTPATIENT_CLINIC_OR_DEPARTMENT_OTHER): Payer: BC Managed Care – PPO | Admitting: Hematology and Oncology

## 2022-07-24 ENCOUNTER — Telehealth: Payer: Self-pay | Admitting: Hematology and Oncology

## 2022-07-24 DIAGNOSIS — Z170 Estrogen receptor positive status [ER+]: Secondary | ICD-10-CM

## 2022-07-24 DIAGNOSIS — Z95828 Presence of other vascular implants and grafts: Secondary | ICD-10-CM

## 2022-07-24 DIAGNOSIS — C50211 Malignant neoplasm of upper-inner quadrant of right female breast: Secondary | ICD-10-CM | POA: Diagnosis not present

## 2022-07-24 LAB — CBC WITH DIFFERENTIAL (CANCER CENTER ONLY)
Abs Immature Granulocytes: 0.05 10*3/uL (ref 0.00–0.07)
Basophils Absolute: 0 10*3/uL (ref 0.0–0.1)
Basophils Relative: 1 %
Eosinophils Absolute: 0 10*3/uL (ref 0.0–0.5)
Eosinophils Relative: 1 %
HCT: 35.4 % — ABNORMAL LOW (ref 36.0–46.0)
Hemoglobin: 12.2 g/dL (ref 12.0–15.0)
Immature Granulocytes: 1 %
Lymphocytes Relative: 26 %
Lymphs Abs: 2.1 10*3/uL (ref 0.7–4.0)
MCH: 33.1 pg (ref 26.0–34.0)
MCHC: 34.5 g/dL (ref 30.0–36.0)
MCV: 95.9 fL (ref 80.0–100.0)
Monocytes Absolute: 0.8 10*3/uL (ref 0.1–1.0)
Monocytes Relative: 9 %
Neutro Abs: 5.2 10*3/uL (ref 1.7–7.7)
Neutrophils Relative %: 62 %
Platelet Count: 200 10*3/uL (ref 150–400)
RBC: 3.69 MIL/uL — ABNORMAL LOW (ref 3.87–5.11)
RDW: 16.2 % — ABNORMAL HIGH (ref 11.5–15.5)
WBC Count: 8.2 10*3/uL (ref 4.0–10.5)
nRBC: 0 % (ref 0.0–0.2)

## 2022-07-24 LAB — CMP (CANCER CENTER ONLY)
ALT: 40 U/L (ref 0–44)
AST: 23 U/L (ref 15–41)
Albumin: 4 g/dL (ref 3.5–5.0)
Alkaline Phosphatase: 51 U/L (ref 38–126)
Anion gap: 8 (ref 5–15)
BUN: 10 mg/dL (ref 6–20)
CO2: 26 mmol/L (ref 22–32)
Calcium: 9.6 mg/dL (ref 8.9–10.3)
Chloride: 105 mmol/L (ref 98–111)
Creatinine: 0.57 mg/dL (ref 0.44–1.00)
GFR, Estimated: >60 mL/min (ref >60)
Glucose, Bld: 91 mg/dL (ref 70–99)
Potassium: 3.7 mmol/L (ref 3.5–5.1)
Sodium: 139 mmol/L (ref 135–145)
Total Bilirubin: 0.4 mg/dL (ref 0.3–1.2)
Total Protein: 6.6 g/dL (ref 6.5–8.1)

## 2022-07-24 MED ORDER — SODIUM CHLORIDE 0.9% FLUSH
10.0000 mL | INTRAVENOUS | Status: DC | PRN
  Administered 2022-07-24: 10 mL

## 2022-07-24 MED ORDER — SODIUM CHLORIDE 0.9 % IV SOLN
150.0000 mg | Freq: Once | INTRAVENOUS | Status: AC
  Administered 2022-07-24: 150 mg via INTRAVENOUS
  Filled 2022-07-24: qty 150

## 2022-07-24 MED ORDER — PALONOSETRON HCL INJECTION 0.25 MG/5ML
0.2500 mg | Freq: Once | INTRAVENOUS | Status: AC
  Administered 2022-07-24: 0.25 mg via INTRAVENOUS
  Filled 2022-07-24: qty 5

## 2022-07-24 MED ORDER — ALTEPLASE 2 MG IJ SOLR
2.0000 mg | Freq: Once | INTRAMUSCULAR | Status: AC | PRN
  Administered 2022-07-24: 2 mg
  Filled 2022-07-24: qty 2

## 2022-07-24 MED ORDER — SODIUM CHLORIDE 0.9 % IV SOLN
600.0000 mg/m2 | Freq: Once | INTRAVENOUS | Status: AC
  Administered 2022-07-24: 1000 mg via INTRAVENOUS
  Filled 2022-07-24: qty 50

## 2022-07-24 MED ORDER — DOXORUBICIN HCL CHEMO IV INJECTION 2 MG/ML
60.0000 mg/m2 | Freq: Once | INTRAVENOUS | Status: AC
  Administered 2022-07-24: 100 mg via INTRAVENOUS
  Filled 2022-07-24: qty 50

## 2022-07-24 MED ORDER — SODIUM CHLORIDE 0.9 % IV SOLN: Freq: Once | INTRAVENOUS | Status: AC

## 2022-07-24 MED ORDER — HEPARIN SOD (PORK) LOCK FLUSH 100 UNIT/ML IV SOLN
500.0000 [IU] | Freq: Once | INTRAVENOUS | Status: AC | PRN
  Administered 2022-07-24: 500 [IU]

## 2022-07-24 MED ORDER — SODIUM CHLORIDE 0.9% FLUSH
10.0000 mL | Freq: Once | INTRAVENOUS | Status: AC
  Administered 2022-07-24: 10 mL

## 2022-07-24 MED ORDER — SODIUM CHLORIDE 0.9 % IV SOLN
10.0000 mg | Freq: Once | INTRAVENOUS | Status: AC
  Administered 2022-07-24: 10 mg via INTRAVENOUS
  Filled 2022-07-24: qty 10

## 2022-07-24 NOTE — Telephone Encounter (Signed)
Spoke with patient confirming upcoming appointment  

## 2022-07-24 NOTE — Progress Notes (Signed)
During administration of first syringe of Doxorubicin, approximately 1145, blood return was lost. The first syringe was completed, and Barbara Cower RN attempted to re-establish blood return but was unsuccessful. Nikki RN re-accessed pt and continued to be unable to gain blood return. Dr Al Pimple was made aware.   At 1251 Cathflo was instilled into the pt's port-a-cath by Emiliano Dyer. At [facility] RN removed cathflo and was able to re-establish blood return.  At 1329 the second syringe of doxorubicin was administered by [employer]. Pt tolerated the even well with no complaints.

## 2022-07-24 NOTE — Assessment & Plan Note (Signed)
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  With tumor size larger than 2 cm, we have discussed about considering neoadjuvant chemotherapy based on keynote 522 study.  Other option would be to do neoadjuvant non anthracycline-based regimen since she has node-negative disease.   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  We did discuss that if she is uncomfortable with the option to treated like a triple negative breast cancer we can certainly try to omit immunotherapy and adjust chemotherapy regimen.   During her last visit, she was thought to have inflammatory arthralgias from immunotherapy.  Hence I have started on prednisone 20 mg p.o. daily.  She is here for follow-up, doing remarkably well, rash, arthralgias, swelling in the joints has completely resolved.  She feels so much better, she has not been taking any pain medication at all.  CBC today reviewed and satisfactory to proceed with treatment.  We once again discussed adverse effects of Adriamycin and Cytoxan based regimen today.  She will continue prednisone 20 mg for this week, see me back for follow-up in 1 week.  She is agreeable to all these recommendations.  Will hold off on rheumatology at this time since patient feels so much better.

## 2022-07-24 NOTE — Progress Notes (Signed)
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 Pt reports the sensation started yesterday. She reports that the sensation is "tingling and numbness" on a mild level that does not interfere with ADL's. Pt reports noticing mostly at night or when she is laying down. MD aware.

## 2022-07-24 NOTE — Patient Instructions (Signed)
[facility]  Discharge Instructions: Thank you for choosing [facility] to provide your oncology and hematology care.   If you have a lab appointment with the Cancer Center, please go directly to the Cancer Center and check in at the registration area.   Wear comfortable clothing and clothing appropriate for easy access to any Portacath or PICC line.   We strive to give you quality time with your provider. You may need to reschedule your appointment if you arrive late (15 or more minutes).  Arriving late affects you and other patients whose appointments are after yours.  Also, if you miss three or more appointments without notifying the office, you may be dismissed from the clinic at the provider's discretion.      For prescription refill requests, have your pharmacy contact our office and allow 72 hours for refills to be completed.    Today you received the following chemotherapy and/or immunotherapy agents Adriamycin, Cytoxan.      To help prevent nausea and vomiting after your treatment, we encourage you to take your nausea medication as directed.  BELOW ARE SYMPTOMS THAT SHOULD BE REPORTED IMMEDIATELY: *FEVER GREATER THAN 100.4 F (38 C) OR HIGHER *CHILLS OR SWEATING *NAUSEA AND VOMITING THAT IS NOT CONTROLLED WITH YOUR NAUSEA MEDICATION *UNUSUAL SHORTNESS OF BREATH *UNUSUAL BRUISING OR BLEEDING *URINARY PROBLEMS (pain or burning when urinating, or frequent urination) *BOWEL PROBLEMS (unusual diarrhea, constipation, pain near the anus) TENDERNESS IN MOUTH AND THROAT WITH OR WITHOUT PRESENCE OF ULCERS (sore throat, sores in mouth, or a toothache) UNUSUAL RASH, SWELLING OR PAIN  UNUSUAL VAGINAL DISCHARGE OR ITCHING   Items with * indicate a potential emergency and should be followed up as soon as possible or go to the Emergency Department if any problems should occur.  Please show the CHEMOTHERAPY ALERT CARD or IMMUNOTHERAPY ALERT  CARD at check-in to the Emergency Department and triage nurse.  Should you have questions after your visit or need to cancel or reschedule your appointment, please contact [facility]  Dept: [phone]  and follow the prompts.  Office hours are 8:00 a.m. to 4:30 p.m. Monday - Friday. Please note that voicemails left after 4:00 p.m. may not be returned until the following business day.  We are closed weekends and major holidays. You have access to a nurse at all times for urgent questions. Please call the main number to the clinic Dept: [phone] and follow the prompts.   For any non-urgent questions, you may also contact your provider using MyChart. We now offer e-Visits for anyone 29 and older to request care online for non-urgent symptoms. For details visit [email or web address].   Also download the MyChart app! Go to the app store, search "MyChart", open the app, select [facility], and log in with your MyChart username and password.

## 2022-07-24 NOTE — Progress Notes (Signed)
[facility] Cancer Follow up:    Vanessa Panda, NP [address] [place] [address]   DIAGNOSIS:  Cancer Staging  Malignant neoplasm of upper-inner quadrant of right breast in female, estrogen receptor positive (HCC) Staging form: Breast, AJCC 8th Edition - Clinical stage from 05/07/2022: Stage IIB (cT2, cN0, cM0, G3, ER+, PR-, HER2-) - Unsigned Stage prefix: Initial diagnosis Histologic grading system: 3 grade system Laterality: Right Staged by: Pathologist and managing physician Stage used in treatment planning: Yes National guidelines used in treatment planning: Yes Type of national guideline used in treatment planning: NCCN   SUMMARY OF ONCOLOGIC HISTORY: Oncology History  Malignant 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   04/25/2022 Pathology Results   Right breast needle core biopsy at 3:00 showed high-grade invasive ductal carcinoma.  Prognostic showed ER 40% positive weak staining intensity, PR 0% negative, group 5 HER2 negative and Ki-67 of 80%.   05/22/2022 -  Chemotherapy   Patient is on Treatment Plan : BREAST Pembrolizumab (200) D1 + Carboplatin (1.5) D1,8,15 + Paclitaxel (80) D1,8,15 q21d X 4 cycles / Pembrolizumab (200) D1 + AC D1 q21d x 4 cycles      Genetic Testing   [facility] Custom Cancer Panel+RNA was Negative. Report date is 05/14/2022.  The Custom Hereditary Cancers Panel offered by [facility] includes sequencing and/or deletion duplication testing of the following 43 genes: APC, ATM, AXIN2, BAP1, BARD1, BMPR1A, BRCA1, BRCA2, BRIP1, CDH1, CDK4, CDKN2A (p14ARF and p16INK4a only), CHEK2, CTNNA1, EPCAM (Deletion/duplication testing only), FH, GREM1 (promoter region duplication testing only), HOXB13, KIT, MBD4, MEN1, MLH1,  MSH2, MSH3, MSH6, MUTYH, NF1, NHTL1, PALB2, PDGFRA, PMS2, POLD1, POLE, PTEN, RAD51C, RAD51D, SMAD4, SMARCA4. STK11, TP53, TSC1, TSC2, and VHL.     CURRENT THERAPY: Carbo, Taxol, Keytruda  INTERVAL HISTORY:  Kenyonna Seefeld 42 y.o. female returns for follow up.  Since her last visit here, she is doing remarkably well.  She is now on prednisone 20 mg daily.  Her arthralgias have resolved, she did not need a pain medication since Sunday.  Her joint swelling has resolved.  Skin rash has resolved.  Overall she feels much better today and would like to proceed with rest of the treatment.  No change in breathing, bowel habits or urinary habits.  The breast mass has now become nonpalpable.   Patient Active Problem List   Diagnosis Date Noted   Port-A-Cath in place 05/22/2022   Genetic testing 05/15/2022   Family history of breast cancer 05/07/2022   Family history of melanoma 05/07/2022   Malignant neoplasm of upper-inner quadrant of right breast in female, estrogen receptor positive (HCC) 05/06/2022   Adjustment disorder with mixed anxiety and depressed mood 03/18/2019   Anxiety 07/12/2018   Abnormal menstruation 07/23/2006   Intractable migraine 08/05/2004   DDD (degenerative disc disease), cervical 08/05/2004    is allergic to contrast media [iodinated contrast media], venlafaxine, amitriptyline, iodine, methocarbamol, and tartrazine.  MEDICAL HISTORY: Past Medical History:  Diagnosis Date   Cancer [facility])    Migraines     SURGICAL HISTORY: Past Surgical History:  Procedure Laterality Date   BREAST BIOPSY Right 04/25/2022   [place] RT BREAST BX W LOC DEV 1ST LESION IMG BX SPEC US GUIDE 04/25/2022 [facility] MAMMOGRAPHY   IR IMAGING GUIDED PORT INSERTION  05/21/2022  SOCIAL HISTORY: Social History   Socioeconomic History   Marital status: Single    Spouse name: Not on file   Number of children: Not on file   Years of education: Not on file   Highest education level: Not on file   Occupational History   Not on file  Tobacco Use   Smoking status: Former    Packs/day: .5    Types: Cigarettes    Quit date: 04/29/2022    Years since quitting: 0.2   Smokeless tobacco: Never  Vaping Use   Vaping Use: Never used  Substance and Sexual Activity   Alcohol use: Yes   Drug use: Never   Sexual activity: Yes    Birth control/protection: Surgical    Comment: MRI 05-16-22 Left Breast Lump found  Other Topics Concern   Not on file  Social History Narrative   Not on file   Social Determinants of Health   Financial Resource Strain: Medium Risk (05/07/2022)   Overall Financial Resource Strain (CARDIA)    Difficulty of Paying Living Expenses: Somewhat hard  Food Insecurity: No Food Insecurity (05/07/2022)   Hunger Vital Sign    Worried About Running Out of Food in the Last Year: Never true    Ran Out of Food in the Last Year: Never true  Transportation Needs: No Transportation Needs (05/07/2022)   PRAPARE - Administrator, Civil Service (Medical): No    Lack of Transportation (Non-Medical): No  Physical Activity: Not on file  Stress: Stress Concern Present (05/07/2022)   [employer] of Occupational Health - Occupational Stress Questionnaire    Feeling of Stress : Very much  Social Connections: Not on file  Intimate Partner Violence: Not on file    FAMILY HISTORY: Family History  Problem Relation Age of Onset   Melanoma Father 73 - 16       dx. again in his early 82s   Breast cancer Paternal Grandmother 59 - 54    Review of Systems  Constitutional:  Positive for fatigue. Negative for appetite change, chills, fever and unexpected weight change.  HENT:   Negative for hearing loss, lump/mass, mouth sores and trouble swallowing.   Eyes:  Negative for eye problems and icterus.  Respiratory:  Negative for chest tightness, cough and shortness of breath.   Cardiovascular:  Negative for chest pain, leg swelling and palpitations.  Gastrointestinal:   Negative for abdominal distention, abdominal pain, constipation, diarrhea, nausea and vomiting.  Endocrine: Negative for hot flashes.  Genitourinary:  Negative for difficulty urinating.   Musculoskeletal:  Positive for arthralgias.  Skin:  Positive for rash. Negative for itching.  Neurological:  Negative for dizziness, extremity weakness, headaches and numbness.  Hematological:  Negative for adenopathy. Does not bruise/bleed easily.  Psychiatric/Behavioral:  Negative for depression. The patient is not nervous/anxious.       PHYSICAL EXAMINATION    Vitals:   07/24/22 0941  BP: (!) 141/90  Pulse: 92  Resp: 17  Temp: 97.6 F (36.4 C)  SpO2: 100%    Physical Exam Constitutional:      General: She is not in acute distress.    Appearance: Normal appearance. She is not toxic-appearing.  HENT:     Head: Normocephalic and atraumatic.     Mouth/Throat:     Pharynx: No oropharyngeal exudate or posterior oropharyngeal erythema.  Eyes:     General: No scleral icterus. Pulmonary:     Effort: Pulmonary effort is normal.     Breath sounds:  Normal breath sounds.  Chest:     Comments: Right breast lower inner quadrant mass non palpable.  She had great clinical response so far. Abdominal:     General: Bowel sounds are normal. There is no distension.     Tenderness: There is no abdominal tenderness.  Musculoskeletal:        General: No swelling (resolved).     Cervical back: Neck supple.  Lymphadenopathy:     Cervical: No cervical adenopathy.  Skin:    Findings: Rash (rash has resolved) present.  Neurological:     General: No focal deficit present.     Mental Status: She is alert.  Psychiatric:        Mood and Affect: Mood normal.        Behavior: Behavior normal.     LABORATORY DATA:  CBC    Component Value Date/Time   WBC 8.2 07/24/2022 0919   WBC 6.8 06/24/2022 1352   RBC 3.69 (L) 07/24/2022 0919   HGB 12.2 07/24/2022 0919   HCT 35.4 (L) 07/24/2022 0919   PLT 200  07/24/2022 0919   MCV 95.9 07/24/2022 0919   MCH 33.1 07/24/2022 0919   MCHC 34.5 07/24/2022 0919   RDW 16.2 (H) 07/24/2022 0919   LYMPHSABS 2.1 07/24/2022 0919   MONOABS 0.8 07/24/2022 0919   EOSABS 0.0 07/24/2022 0919   BASOSABS 0.0 07/24/2022 0919    CMP     Component Value Date/Time   NA 139 07/17/2022 0913   K 4.0 07/17/2022 0913   CL 107 07/17/2022 0913   CO2 27 07/17/2022 0913   GLUCOSE 96 07/17/2022 0913   BUN 9 07/17/2022 0913   CREATININE 0.55 07/17/2022 0913   CALCIUM 9.0 07/17/2022 0913   PROT 6.0 (L) 07/17/2022 0913   ALBUMIN 3.8 07/17/2022 0913   AST 18 07/17/2022 0913   ALT 29 07/17/2022 0913   ALKPHOS 48 07/17/2022 0913   BILITOT 0.4 07/17/2022 0913   GFRNONAA >60 07/17/2022 0913    ASSESSMENT and THERAPY PLAN:   Malignant neoplasm of upper-inner quadrant of right breast in female, estrogen receptor positive (HCC) This is a very pleasant 42 year old female patient with newly diagnosed right breast high-grade invasive ductal carcinoma, ER +40% weak staining, PR 0% negative, HER2 negative, Ki-67 of 80% referred to breast [facility] for additional recommendations.  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  With tumor size larger than 2 cm, we have discussed about considering neoadjuvant chemotherapy based on keynote 522 study.  Other option would be to do neoadjuvant non anthracycline-based regimen since she has node-negative disease.   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  We did discuss that if she is uncomfortable with the option to treated like a triple negative breast cancer we can certainly try to omit immunotherapy and adjust chemotherapy regimen.   During her last visit, she was thought to have inflammatory  arthralgias from immunotherapy.  Hence I have started on prednisone 20 mg p.o. daily.  She is here for follow-up, doing remarkably well, rash, arthralgias, swelling in the joints has completely resolved.  She feels so much better, she has not been taking any pain medication at all.  CBC today reviewed and satisfactory to proceed with treatment.  We once again discussed adverse effects of  Adriamycin and Cytoxan based regimen today.  She will continue prednisone 20 mg for this week, see me back for follow-up in 1 week.  She is agreeable to all these recommendations.  Will hold off on rheumatology at this time since patient feels so much better.  All questions were answered. The patient knows to call the clinic with any problems, questions or concerns. We can certainly see the patient much sooner if necessary.  Total encounter time:30 minutes*in face-to-face visit time, chart review, lab review, care coordination, order entry, and documentation of the encounter time.    *Total Encounter Time as defined by the Centers for Medicare and Medicaid Services includes, in addition to the face-to-face time of a patient visit (documented in the note above) non-face-to-face time: obtaining and reviewing outside history, ordering and reviewing medications, tests or procedures, care coordination (communications with other health care professionals or caregivers) and documentation in the medical record.

## 2022-07-30 ENCOUNTER — Telehealth: Payer: Self-pay | Admitting: Hematology and Oncology

## 2022-07-30 NOTE — Telephone Encounter (Signed)
Rescheduled appointment per 6/19 secure chat. Patient is aware of the changes made to her upcoming appointment. 

## 2022-07-31 ENCOUNTER — Ambulatory Visit: Payer: BC Managed Care – PPO | Admitting: Hematology and Oncology

## 2022-07-31 ENCOUNTER — Ambulatory Visit: Payer: BC Managed Care – PPO

## 2022-07-31 ENCOUNTER — Other Ambulatory Visit: Payer: BC Managed Care – PPO

## 2022-07-31 ENCOUNTER — Encounter: Payer: Self-pay | Admitting: *Deleted

## 2022-08-01 ENCOUNTER — Other Ambulatory Visit: Payer: BC Managed Care – PPO

## 2022-08-01 ENCOUNTER — Other Ambulatory Visit: Payer: Self-pay

## 2022-08-01 ENCOUNTER — Encounter: Payer: Self-pay | Admitting: Hematology and Oncology

## 2022-08-01 ENCOUNTER — Other Ambulatory Visit: Payer: Self-pay | Admitting: Adult Health

## 2022-08-01 ENCOUNTER — Ambulatory Visit: Payer: BC Managed Care – PPO | Admitting: Adult Health

## 2022-08-01 ENCOUNTER — Inpatient Hospital Stay (HOSPITAL_BASED_OUTPATIENT_CLINIC_OR_DEPARTMENT_OTHER): Payer: BC Managed Care – PPO | Admitting: Hematology and Oncology

## 2022-08-01 ENCOUNTER — Ambulatory Visit: Payer: BC Managed Care – PPO

## 2022-08-01 VITALS — BP 123/83 | HR 77 | Temp 98.0°F | Resp 18 | Wt 142.4 lb

## 2022-08-01 DIAGNOSIS — Z170 Estrogen receptor positive status [ER+]: Secondary | ICD-10-CM | POA: Diagnosis not present

## 2022-08-01 DIAGNOSIS — C50211 Malignant neoplasm of upper-inner quadrant of right female breast: Secondary | ICD-10-CM

## 2022-08-01 MED ORDER — LIDOCAINE VISCOUS HCL 2 % MT SOLN: 15.0000 mL | OROMUCOSAL | 0 refills | Status: DC | PRN

## 2022-08-01 NOTE — Progress Notes (Signed)
[facility] Cancer Follow up:    Vanessa Panda, NP [address] [place] [address]   DIAGNOSIS:  Cancer Staging  Malignant neoplasm of upper-inner quadrant of right breast in female, estrogen receptor positive (HCC) Staging form: Breast, AJCC 8th Edition - Clinical stage from 05/07/2022: Stage IIB (cT2, cN0, cM0, G3, ER+, PR-, HER2-) - Unsigned Stage prefix: Initial diagnosis Histologic grading system: 3 grade system Laterality: Right Staged by: Pathologist and managing physician Stage used in treatment planning: Yes National guidelines used in treatment planning: Yes Type of national guideline used in treatment planning: NCCN   SUMMARY OF ONCOLOGIC HISTORY: Oncology History  Malignant 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   04/25/2022 Pathology Results   Right breast needle core biopsy at 3:00 showed high-grade invasive ductal carcinoma.  Prognostic showed ER 40% positive weak staining intensity, PR 0% negative, group 5 HER2 negative and Ki-67 of 80%.   05/22/2022 -  Chemotherapy   Patient is on Treatment Plan : BREAST Pembrolizumab (200) D1 + Carboplatin (1.5) D1,8,15 + Paclitaxel (80) D1,8,15 q21d X 4 cycles / Pembrolizumab (200) D1 + AC D1 q21d x 4 cycles      Genetic Testing   [facility] Custom Cancer Panel+RNA was Negative. Report date is 05/14/2022.  The Custom Hereditary Cancers Panel offered by [facility] includes sequencing and/or deletion duplication testing of the following 43 genes: APC, ATM, AXIN2, BAP1, BARD1, BMPR1A, BRCA1, BRCA2, BRIP1, CDH1, CDK4, CDKN2A (p14ARF and p16INK4a only), CHEK2, CTNNA1, EPCAM (Deletion/duplication testing only), FH, GREM1 (promoter region duplication testing only), HOXB13, KIT, MBD4, MEN1, MLH1,  MSH2, MSH3, MSH6, MUTYH, NF1, NHTL1, PALB2, PDGFRA, PMS2, POLD1, POLE, PTEN, RAD51C, RAD51D, SMAD4, SMARCA4. STK11, TP53, TSC1, TSC2, and VHL.     CURRENT THERAPY: Carbo, Taxol, Keytruda  INTERVAL HISTORY:  Vanessa Murphy 42 y.o. female returns for follow up.  Since her last visit here, she is doing remarkably well.  She continues to have some mild ankle pains and has been taking prednisone as instructed.  She otherwise does not have any more knee pains.  She feels and tries to be in good spirit.  She understandably is tired from the treatment.  She went to the beach with her daughter but could not stay very active and was disappointed.  She denies any major pains that require pain medication today.  She had some nausea but took Zofran which has helped her tremendously. Rest of the pertinent 10 point ROS reviewed and negative    Patient Active Problem List   Diagnosis Date Noted   Port-A-Cath in place 05/22/2022   Genetic testing 05/15/2022   Family history of breast cancer 05/07/2022   Family history of melanoma 05/07/2022   Malignant neoplasm of upper-inner quadrant of right breast in female, estrogen receptor positive (HCC) 05/06/2022   Adjustment disorder with mixed anxiety and depressed mood 03/18/2019   Anxiety 07/12/2018   Abnormal menstruation 07/23/2006   Intractable migraine 08/05/2004   DDD (degenerative disc disease), cervical 08/05/2004    is allergic to contrast media [iodinated contrast media], venlafaxine, amitriptyline, iodine, methocarbamol, and tartrazine.  MEDICAL HISTORY: Past Medical History:  Diagnosis Date   Cancer [facility])    Migraines     SURGICAL HISTORY: Past Surgical History:  Procedure Laterality Date   BREAST BIOPSY Right 04/25/2022  [place] RT BREAST BX W LOC DEV 1ST LESION IMG BX SPEC US GUIDE 04/25/2022 [facility]AMMOGRAP[facility]   IR IMAGING GUIDED PORT INSERTION  05/21/2022    SOCIAL HISTORY: Social History   Socioeconomic History   Marital  status: Single    Spouse name: Not on file   Number of children: Not on file   Years of education: Not on file   Highest education level: Not on file  Occupational History   Not on file  Tobacco Use   Smoking status: Former    Packs/day: .5    Types: Cigarettes    Quit date: 04/29/2022    Years since quitting: 0.2   Smokeless tobacco: Never  Vaping Use   Vaping Use: Never used  Substance and Sexual Activity   Alcohol use: Yes   Drug use: Never   Sexual activity: Yes    Birth control/protection: Surgical    Comment: MRI 05-16-22 Left Breast Lump found  Other Topics Concern   Not on file  Social History Narrative   Not on file   Social Determinants of Health   Financial Resource Strain: Medium Risk (05/07/2022)   Overall Financial Resource Strain (CARDIA)    Difficulty of Paying Living Expenses: Somewhat hard  Food Insecurity: No Food Insecurity (05/07/2022)   Hunger Vital Sign    Worried About Running Out of Food in the Last Year: Never true    Ran Out of Food in the Last Year: Never true  Transportation Needs: No Transportation Needs (05/07/2022)   PRAPARE - Administrator, Civil Service (Medical): No    Lack of Transportation (Non-Medical): No  Physical Activity: Not on file  Stress: Stress Concern Present (05/07/2022)   [employer] of Occupational Health - Occupational Stress Questionnaire    Feeling of Stress : Very much  Social Connections: Not on file  Intimate Partner Violence: Not on file    FAMILY HISTORY: Family History  Problem Relation Age of Onset   Melanoma Father 74 - 6       dx. again in his early 84s   Breast cancer Paternal Grandmother 68 - 59    Review of Systems  Constitutional:  Positive for fatigue. Negative for appetite change, chills, fever and unexpected weight change.  HENT:   Negative for hearing loss, lump/mass, mouth sores and trouble swallowing.   Eyes:  Negative for eye problems and icterus.  Respiratory:  Negative  for chest tightness, cough and shortness of breath.   Cardiovascular:  Negative for chest pain, leg swelling and palpitations.  Gastrointestinal:  Negative for abdominal distention, abdominal pain, constipation, diarrhea, nausea and vomiting.  Endocrine: Negative for hot flashes.  Genitourinary:  Negative for difficulty urinating.   Musculoskeletal:  Positive for arthralgias.  Skin:  Positive for rash. Negative for itching.  Neurological:  Negative for dizziness, extremity weakness, headaches and numbness.  Hematological:  Negative for adenopathy. Does not bruise/bleed easily.  Psychiatric/Behavioral:  Negative for depression. The patient is not nervous/anxious.       PHYSICAL EXAMINATION    Vitals:   08/01/22 1238  BP: 123/83  Pulse: 77  Resp: 18  Temp: 98 F (36.7 C)  SpO2: 99%     Physical Exam Constitutional:      General: She is not in acute distress.    Appearance: Normal appearance. She is not toxic-appearing.  HENT:     Head: Normocephalic and atraumatic.     Mouth/Throat:     Pharynx: No oropharyngeal exudate or  posterior oropharyngeal erythema.  Eyes:     General: No scleral icterus. Pulmonary:     Effort: Pulmonary effort is normal.     Breath sounds: Normal breath sounds.  Chest:     Comments: Breast exam deferred Abdominal:     General: Bowel sounds are normal. There is no distension.     Tenderness: There is no abdominal tenderness.  Musculoskeletal:        General: No swelling (resolved).     Cervical back: Neck supple.  Lymphadenopathy:     Cervical: No cervical adenopathy.  Skin:    Findings: Rash (rash has resolved) present.  Neurological:     General: No focal deficit present.     Mental Status: She is alert.  Psychiatric:        Mood and Affect: Mood normal.        Behavior: Behavior normal.     LABORATORY DATA:  CBC    Component Value Date/Time   WBC 8.2 07/24/2022 0919   WBC 6.8 06/24/2022 1352   RBC 3.69 (L) 07/24/2022 0919    HGB 12.2 07/24/2022 0919   HCT 35.4 (L) 07/24/2022 0919   PLT 200 07/24/2022 0919   MCV 95.9 07/24/2022 0919   MCH 33.1 07/24/2022 0919   MCHC 34.5 07/24/2022 0919   RDW 16.2 (H) 07/24/2022 0919   LYMPHSABS 2.1 07/24/2022 0919   MONOABS 0.8 07/24/2022 0919   EOSABS 0.0 07/24/2022 0919   BASOSABS 0.0 07/24/2022 0919    CMP     Component Value Date/Time   NA 139 07/24/2022 0919   K 3.7 07/24/2022 0919   CL 105 07/24/2022 0919   CO2 26 07/24/2022 0919   GLUCOSE 91 07/24/2022 0919   BUN 10 07/24/2022 0919   CREATININE 0.57 07/24/2022 0919   CALCIUM 9.6 07/24/2022 0919   PROT 6.6 07/24/2022 0919   ALBUMIN 4.0 07/24/2022 0919   AST 23 07/24/2022 0919   ALT 40 07/24/2022 0919   ALKPHOS 51 07/24/2022 0919   BILITOT 0.4 07/24/2022 0919   GFRNONAA >60 07/24/2022 0919    ASSESSMENT and THERAPY PLAN:   Malignant neoplasm of upper-inner quadrant of right breast in female, estrogen receptor positive (HCC) This is a very pleasant 42 year old female patient with newly diagnosed right breast high-grade invasive ductal carcinoma, ER +40% weak staining, PR 0% negative, HER2 negative, Ki-67 of 80% referred to breast MDC for additional recommendations.  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  With tumor size larger than 2 cm, we have discussed about considering neoadjuvant chemotherapy based on keynote 522 study.  Other option would be to do neoadjuvant non anthracycline-based regimen since she has node-negative disease.   Given high-grade, high proliferation index and weak staining of estrogen receptor, we attempted to treat with Key note 522 regimen. She was then found to have inflammatory arthralgias from immunotherapy. She is now on prednisone 20 mg daily, will start taper this coming week. She doesn't need any pain medication. Nausea is well controlled with PRN zofran.  She is overall doing very well with Adriamycin  and cyclophosphamide so far.  She did have a recurrence of skin rash.  She is emotionally going through a lot because she is trying to work and do all her household chores.  She will proceed with the cycle 2 of Adriamycin and cyclophosphamide, no growth factor planned after. Ok to return to clinic as scheduled.   All questions were answered.  The patient knows to call the clinic with any problems, questions or concerns. We can certainly see the patient much sooner if necessary.  Total encounter time:30 minutes*in face-to-face visit time, chart review, lab review, care coordination, order entry, and documentation of the encounter time.    *Total Encounter Time as defined by the Centers for Medicare and Medicaid Services includes, in addition to the face-to-face time of a patient visit (documented in the note above) non-face-to-face time: obtaining and reviewing outside history, ordering and reviewing medications, tests or procedures, care coordination (communications with other health care professionals or caregivers) and documentation in the medical record.

## 2022-08-01 NOTE — Assessment & Plan Note (Addendum)
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  With tumor size larger than 2 cm, we have discussed about considering neoadjuvant chemotherapy based on keynote 522 study.  Other option would be to do neoadjuvant non anthracycline-based regimen since she has node-negative disease.   Given high-grade, high proliferation index and weak staining of estrogen receptor, we attempted to treat with Key note 522 regimen. She was then found to have inflammatory arthralgias from immunotherapy. She is now on prednisone 20 mg daily, will start taper this coming week. She doesn't need any pain medication. Nausea is well controlled with PRN zofran.  She is overall doing very well with Adriamycin and cyclophosphamide so far.  She did have a recurrence of skin rash.  She is emotionally going through a lot because she is trying to work and do all her household chores.  She will proceed with the cycle 2 of Adriamycin and cyclophosphamide, no growth factor planned after. Ok to return to clinic as scheduled.

## 2022-08-07 ENCOUNTER — Other Ambulatory Visit: Payer: BC Managed Care – PPO

## 2022-08-07 ENCOUNTER — Ambulatory Visit: Payer: BC Managed Care – PPO | Admitting: Hematology and Oncology

## 2022-08-07 ENCOUNTER — Ambulatory Visit: Payer: BC Managed Care – PPO

## 2022-08-08 ENCOUNTER — Ambulatory Visit: Payer: BC Managed Care – PPO

## 2022-08-09 ENCOUNTER — Ambulatory Visit: Payer: BC Managed Care – PPO

## 2022-08-11 ENCOUNTER — Encounter: Payer: Self-pay | Admitting: Hematology and Oncology

## 2022-08-11 ENCOUNTER — Ambulatory Visit: Payer: BC Managed Care – PPO

## 2022-08-11 ENCOUNTER — Encounter: Payer: Self-pay | Admitting: *Deleted

## 2022-08-11 ENCOUNTER — Other Ambulatory Visit: Payer: Self-pay | Admitting: *Deleted

## 2022-08-11 MED ORDER — CIPROFLOXACIN HCL 0.3 % OP SOLN: 2.0000 [drp] | OPHTHALMIC | 0 refills | Status: DC

## 2022-08-11 NOTE — Progress Notes (Signed)
Correction to my previous note. Patient was approved on 05/22/22 and received paperwork/green folder on that day.

## 2022-08-11 NOTE — Progress Notes (Signed)
Patient was approved for one-time $1000 [employer] grant on 06/02/22. She received a copy of the approval letter and expense sheet.  Patient called today and left voicemail regarding how to submit expense. Advised to bring expense on 08/13/22 at next visit to be submitted for processing. Advised I will be on vacation, however registration will send to my covering partner for submission. Advised it will take up to two weeks for merchant to receive the check. She verbalized understanding.  My card was placed in the green folder she received along with grant paperwork should she have any additional questions or concerns.

## 2022-08-12 MED FILL — Dexamethasone Sodium Phosphate Inj 100 MG/10ML: INTRAMUSCULAR | Qty: 1 | Status: AC

## 2022-08-12 MED FILL — Fosaprepitant Dimeglumine For IV Infusion 150 MG (Base Eq): INTRAVENOUS | Qty: 5 | Status: AC

## 2022-08-13 ENCOUNTER — Inpatient Hospital Stay: Payer: BC Managed Care – PPO

## 2022-08-13 ENCOUNTER — Inpatient Hospital Stay (HOSPITAL_BASED_OUTPATIENT_CLINIC_OR_DEPARTMENT_OTHER): Payer: BC Managed Care – PPO | Admitting: Hematology and Oncology

## 2022-08-13 ENCOUNTER — Other Ambulatory Visit: Payer: Self-pay

## 2022-08-13 ENCOUNTER — Inpatient Hospital Stay: Payer: BC Managed Care – PPO | Attending: Hematology and Oncology

## 2022-08-13 VITALS — BP 101/72 | HR 66 | Resp 18

## 2022-08-13 VITALS — BP 115/87 | HR 72 | Temp 97.9°F | Resp 16 | Wt 144.9 lb

## 2022-08-13 DIAGNOSIS — M2550 Pain in unspecified joint: Secondary | ICD-10-CM | POA: Insufficient documentation

## 2022-08-13 DIAGNOSIS — K5900 Constipation, unspecified: Secondary | ICD-10-CM | POA: Insufficient documentation

## 2022-08-13 DIAGNOSIS — N951 Menopausal and female climacteric states: Secondary | ICD-10-CM

## 2022-08-13 DIAGNOSIS — B370 Candidal stomatitis: Secondary | ICD-10-CM | POA: Insufficient documentation

## 2022-08-13 DIAGNOSIS — F1090 Alcohol use, unspecified, uncomplicated: Secondary | ICD-10-CM | POA: Diagnosis not present

## 2022-08-13 DIAGNOSIS — Z79899 Other long term (current) drug therapy: Secondary | ICD-10-CM | POA: Insufficient documentation

## 2022-08-13 DIAGNOSIS — Z808 Family history of malignant neoplasm of other organs or systems: Secondary | ICD-10-CM | POA: Diagnosis not present

## 2022-08-13 DIAGNOSIS — C50211 Malignant neoplasm of upper-inner quadrant of right female breast: Secondary | ICD-10-CM | POA: Diagnosis present

## 2022-08-13 DIAGNOSIS — R5383 Other fatigue: Secondary | ICD-10-CM | POA: Insufficient documentation

## 2022-08-13 DIAGNOSIS — Z5111 Encounter for antineoplastic chemotherapy: Secondary | ICD-10-CM | POA: Insufficient documentation

## 2022-08-13 DIAGNOSIS — R112 Nausea with vomiting, unspecified: Secondary | ICD-10-CM | POA: Diagnosis not present

## 2022-08-13 DIAGNOSIS — Z170 Estrogen receptor positive status [ER+]: Secondary | ICD-10-CM

## 2022-08-13 DIAGNOSIS — M7989 Other specified soft tissue disorders: Secondary | ICD-10-CM | POA: Insufficient documentation

## 2022-08-13 DIAGNOSIS — Z803 Family history of malignant neoplasm of breast: Secondary | ICD-10-CM | POA: Diagnosis not present

## 2022-08-13 DIAGNOSIS — Z95828 Presence of other vascular implants and grafts: Secondary | ICD-10-CM

## 2022-08-13 DIAGNOSIS — Z87891 Personal history of nicotine dependence: Secondary | ICD-10-CM | POA: Diagnosis not present

## 2022-08-13 LAB — CMP (CANCER CENTER ONLY)
ALT: 23 U/L (ref 0–44)
AST: 17 U/L (ref 15–41)
Albumin: 3.9 g/dL (ref 3.5–5.0)
Alkaline Phosphatase: 52 U/L (ref 38–126)
Anion gap: 7 (ref 5–15)
BUN: 10 mg/dL (ref 6–20)
CO2: 27 mmol/L (ref 22–32)
Calcium: 9.6 mg/dL (ref 8.9–10.3)
Chloride: 103 mmol/L (ref 98–111)
Creatinine: 0.64 mg/dL (ref 0.44–1.00)
GFR, Estimated: >60 mL/min (ref >60)
Glucose, Bld: 76 mg/dL (ref 70–99)
Potassium: 3.8 mmol/L (ref 3.5–5.1)
Sodium: 137 mmol/L (ref 135–145)
Total Bilirubin: 0.2 mg/dL — ABNORMAL LOW (ref 0.3–1.2)
Total Protein: 6 g/dL — ABNORMAL LOW (ref 6.5–8.1)

## 2022-08-13 LAB — CBC WITH DIFFERENTIAL (CANCER CENTER ONLY)
Abs Immature Granulocytes: 0.48 10*3/uL — ABNORMAL HIGH (ref 0.00–0.07)
Basophils Absolute: 0 10*3/uL (ref 0.0–0.1)
Basophils Relative: 1 %
Eosinophils Absolute: 0 10*3/uL (ref 0.0–0.5)
Eosinophils Relative: 0 %
HCT: 33.5 % — ABNORMAL LOW (ref 36.0–46.0)
Hemoglobin: 11.3 g/dL — ABNORMAL LOW (ref 12.0–15.0)
Immature Granulocytes: 6 %
Lymphocytes Relative: 34 %
Lymphs Abs: 2.8 10*3/uL (ref 0.7–4.0)
MCH: 32.7 pg (ref 26.0–34.0)
MCHC: 33.7 g/dL (ref 30.0–36.0)
MCV: 96.8 fL (ref 80.0–100.0)
Monocytes Absolute: 1.3 10*3/uL — ABNORMAL HIGH (ref 0.1–1.0)
Monocytes Relative: 16 %
Neutro Abs: 3.8 10*3/uL (ref 1.7–7.7)
Neutrophils Relative %: 43 %
Platelet Count: 334 10*3/uL (ref 150–400)
RBC: 3.46 MIL/uL — ABNORMAL LOW (ref 3.87–5.11)
RDW: 16.4 % — ABNORMAL HIGH (ref 11.5–15.5)
Smear Review: NORMAL
WBC Count: 8.4 10*3/uL (ref 4.0–10.5)
nRBC: 0.7 % — ABNORMAL HIGH (ref 0.0–0.2)

## 2022-08-13 LAB — TSH: TSH: 2.906 u[IU]/mL (ref 0.350–4.500)

## 2022-08-13 MED ORDER — SODIUM CHLORIDE 0.9 % IV SOLN
150.0000 mg | Freq: Once | INTRAVENOUS | Status: AC
  Administered 2022-08-13: 150 mg via INTRAVENOUS
  Filled 2022-08-13: qty 150

## 2022-08-13 MED ORDER — DOXORUBICIN HCL CHEMO IV INJECTION 2 MG/ML
60.0000 mg/m2 | Freq: Once | INTRAVENOUS | Status: AC
  Administered 2022-08-13: 100 mg via INTRAVENOUS
  Filled 2022-08-13: qty 50

## 2022-08-13 MED ORDER — SODIUM CHLORIDE 0.9 % IV SOLN
10.0000 mg | Freq: Once | INTRAVENOUS | Status: AC
  Administered 2022-08-13: 10 mg via INTRAVENOUS
  Filled 2022-08-13: qty 10

## 2022-08-13 MED ORDER — SODIUM CHLORIDE 0.9 % IV SOLN
600.0000 mg/m2 | Freq: Once | INTRAVENOUS | Status: AC
  Administered 2022-08-13: 1000 mg via INTRAVENOUS
  Filled 2022-08-13: qty 50

## 2022-08-13 MED ORDER — SODIUM CHLORIDE 0.9% FLUSH
10.0000 mL | Freq: Once | INTRAVENOUS | Status: AC
  Administered 2022-08-13: 10 mL

## 2022-08-13 MED ORDER — VENLAFAXINE HCL ER 37.5 MG PO CP24: 37.5000 mg | ORAL_CAPSULE | Freq: Every day | ORAL | 1 refills | Status: DC

## 2022-08-13 MED ORDER — SODIUM CHLORIDE 0.9 % IV SOLN: Freq: Once | INTRAVENOUS | Status: AC

## 2022-08-13 MED ORDER — SODIUM CHLORIDE 0.9% FLUSH
10.0000 mL | INTRAVENOUS | Status: DC | PRN
  Administered 2022-08-13: 10 mL

## 2022-08-13 MED ORDER — HEPARIN SOD (PORK) LOCK FLUSH 100 UNIT/ML IV SOLN
500.0000 [IU] | Freq: Once | INTRAVENOUS | Status: AC | PRN
  Administered 2022-08-13: 500 [IU]

## 2022-08-13 MED ORDER — PALONOSETRON HCL INJECTION 0.25 MG/5ML
0.2500 mg | Freq: Once | INTRAVENOUS | Status: AC
  Administered 2022-08-13: 0.25 mg via INTRAVENOUS
  Filled 2022-08-13: qty 5

## 2022-08-13 NOTE — Assessment & Plan Note (Addendum)
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  With tumor size larger than 2 cm, we have discussed about considering neoadjuvant chemotherapy based on keynote 522 study.  Other option would be to do neoadjuvant non anthracycline-based regimen since she has node-negative disease.   Given high-grade, high proliferation index and weak staining of estrogen receptor, we attempted to treat with Key note 522 regimen. She was then found to have inflammatory arthralgias from immunotherapy.  Hence we have omitted immunotherapy.  She is now only on Adriamycin and cyclophosphamide.  She has been tapered off of prednisone about 2 days ago and continues to do well without any recurrence of arthralgias or skin rash.  She had some mild left eye inflammation which has spontaneously resolved.  She has noticed some severe hot flashes which have been keeping her awake and wondered if she can take something for it.  Other than this, she has been using some extra magnesium for muscle cramps, eating potassium rich foods and hydrating well.  She will proceed with chemotherapy today and return to clinic in about 3 weeks for her next cycle of chemotherapy  She will start on Effexor 37.5 mg daily for vasomotor symptoms of menopause.

## 2022-08-13 NOTE — Progress Notes (Signed)
Blood return noted before, during, and after adriamycin push was given.

## 2022-08-13 NOTE — Patient Instructions (Addendum)
[facility]  Discharge Instructions: Thank you for choosing [facility] to provide your oncology and hematology care.   If you have a lab appointment with the Cancer Center, please go directly to the Cancer Center and check in at the registration area.   Wear comfortable clothing and clothing appropriate for easy access to any Portacath or PICC line.   We strive to give you quality time with your provider. You may need to reschedule your appointment if you arrive late (15 or more minutes).  Arriving late affects you and other patients whose appointments are after yours.  Also, if you miss three or more appointments without notifying the office, you may be dismissed from the clinic at the provider's discretion.      For prescription refill requests, have your pharmacy contact our office and allow 72 hours for refills to be completed.    Today you received the following chemotherapy and/or immunotherapy agents Adriamycin, Cytoxan.      To help prevent nausea and vomiting after your treatment, we encourage you to take your nausea medication as directed.  BELOW ARE SYMPTOMS THAT SHOULD BE REPORTED IMMEDIATELY: *FEVER GREATER THAN 100.4 F (38 C) OR HIGHER *CHILLS OR SWEATING *NAUSEA AND VOMITING THAT IS NOT CONTROLLED WITH YOUR NAUSEA MEDICATION *UNUSUAL SHORTNESS OF BREATH *UNUSUAL BRUISING OR BLEEDING *URINARY PROBLEMS (pain or burning when urinating, or frequent urination) *BOWEL PROBLEMS (unusual diarrhea, constipation, pain near the anus) TENDERNESS IN MOUTH AND THROAT WITH OR WITHOUT PRESENCE OF ULCERS (sore throat, sores in mouth, or a toothache) UNUSUAL RASH, SWELLING OR PAIN  UNUSUAL VAGINAL DISCHARGE OR ITCHING   Items with * indicate a potential emergency and should be followed up as soon as possible or go to the Emergency Department if any problems should occur.  Please show the CHEMOTHERAPY ALERT CARD or IMMUNOTHERAPY ALERT  CARD at check-in to the Emergency Department and triage nurse.  Should you have questions after your visit or need to cancel or reschedule your appointment, please contact [facility]  Dept: [phone]  and follow the prompts.  Office hours are 8:00 a.m. to 4:30 p.m. Monday - Friday. Please note that voicemails left after 4:00 p.m. may not be returned until the following business day.  We are closed weekends and major holidays. You have access to a nurse at all times for urgent questions. Please call the main number to the clinic Dept: [phone] and follow the prompts.   For any non-urgent questions, you may also contact your provider using MyChart. We now offer e-Visits for anyone 18 and older to request care online for non-urgent symptoms. For details visit [email or web address].   Also download the MyChart app! Go to the app store, search "MyChart", open the app, select [facility], and log in with your MyChart username and password.   

## 2022-08-13 NOTE — Progress Notes (Signed)
[facility] Cancer Follow up:    Vanessa Panda, NP [address] [place] [address]   DIAGNOSIS:  Cancer Staging  Malignant neoplasm of upper-inner quadrant of right breast in female, estrogen receptor positive (HCC) Staging form: Breast, AJCC 8th Edition - Clinical stage from 05/07/2022: Stage IIB (cT2, cN0, cM0, G3, ER+, PR-, HER2-) - Unsigned Stage prefix: Initial diagnosis Histologic grading system: 3 grade system Laterality: Right Staged by: Pathologist and managing physician Stage used in treatment planning: Yes National guidelines used in treatment planning: Yes Type of national guideline used in treatment planning: NCCN   SUMMARY OF ONCOLOGIC HISTORY: Oncology History  Malignant 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   04/25/2022 Pathology Results   Right breast needle core biopsy at 3:00 showed high-grade invasive ductal carcinoma.  Prognostic showed ER 40% positive weak staining intensity, PR 0% negative, group 5 HER2 negative and Ki-67 of 80%.   05/22/2022 -  Chemotherapy   Patient is on Treatment Plan : BREAST Pembrolizumab (200) D1 + Carboplatin (1.5) D1,8,15 + Paclitaxel (80) D1,8,15 q21d X 4 cycles / Pembrolizumab (200) D1 + AC D1 q21d x 4 cycles      Genetic Testing   [facility] Custom Cancer Panel+RNA was Negative. Report date is 05/14/2022.  The Custom Hereditary Cancers Panel offered by [facility] includes sequencing and/or deletion duplication testing of the following 43 genes: APC, ATM, AXIN2, BAP1, BARD1, BMPR1A, BRCA1, BRCA2, BRIP1, CDH1, CDK4, CDKN2A (p14ARF and p16INK4a only), CHEK2, CTNNA1, EPCAM (Deletion/duplication testing only), FH, GREM1 (promoter region duplication testing only), HOXB13, KIT, MBD4, MEN1, MLH1,  MSH2, MSH3, MSH6, MUTYH, NF1, NHTL1, PALB2, PDGFRA, PMS2, POLD1, POLE, PTEN, RAD51C, RAD51D, SMAD4, SMARCA4. STK11, TP53, TSC1, TSC2, and VHL.     CURRENT THERAPY: Carbo, Taxol, Keytruda  INTERVAL HISTORY:  Vanessa Murphy 42 y.o. female returns for follow up.  Since her last visit here, she is doing remarkably well.  She had some left eye redness and pain which has resolved. She has been having lower extremity cramps, she has been eating some potassium rich foods. Besides this, hot flashes have been very bothersome, she wonders if she can take something from it.She is done with prednisone now for 2 days. Feet are itchy, no rash. NO joint pain or swelling. NO fevers or chills. No neuropathy. No diarrhea or constipation, hematuria or dysuria.  Patient Active Problem List   Diagnosis Date Noted   Port-A-Cath in place 05/22/2022   Genetic testing 05/15/2022   Family history of breast cancer 05/07/2022   Family history of melanoma 05/07/2022   Malignant neoplasm of upper-inner quadrant of right breast in female, estrogen receptor positive (HCC) 05/06/2022   Adjustment disorder with mixed anxiety and depressed mood 03/18/2019   Anxiety 07/12/2018   Abnormal menstruation 07/23/2006   Intractable migraine 08/05/2004   DDD (degenerative disc disease), cervical 08/05/2004    is allergic to contrast media [iodinated contrast media], venlafaxine, amitriptyline, iodine, methocarbamol, and tartrazine.  MEDICAL HISTORY: Past Medical History:  Diagnosis Date   Cancer [facility])    Migraines     SURGICAL HISTORY: Past Surgical History:  Procedure Laterality Date   BREAST BIOPSY Right 04/25/2022   [place] RT BREAST BX W LOC DEV 1ST LESION IMG BX SPEC US GUIDE 04/25/2022 [facility] MAMMOGRAPHY   IR IMAGING GUIDED  PORT INSERTION  05/21/2022    SOCIAL HISTORY: Social History   Socioeconomic History   Marital status: Single    Spouse name: Not on file   Number of children: Not on file   Years of  education: Not on file   Highest education level: Not on file  Occupational History   Not on file  Tobacco Use   Smoking status: Former    Packs/day: .5    Types: Cigarettes    Quit date: 04/29/2022    Years since quitting: 0.2   Smokeless tobacco: Never  Vaping Use   Vaping Use: Never used  Substance and Sexual Activity   Alcohol use: Yes   Drug use: Never   Sexual activity: Yes    Birth control/protection: Surgical    Comment: MRI 05-16-22 Left Breast Lump found  Other Topics Concern   Not on file  Social History Narrative   Not on file   Social Determinants of Health   Financial Resource Strain: Medium Risk (05/07/2022)   Overall Financial Resource Strain (CARDIA)    Difficulty of Paying Living Expenses: Somewhat hard  Food Insecurity: No Food Insecurity (05/07/2022)   Hunger Vital Sign    Worried About Running Out of Food in the Last Year: Never true    Ran Out of Food in the Last Year: Never true  Transportation Needs: No Transportation Needs (05/07/2022)   PRAPARE - Administrator, Civil Service (Medical): No    Lack of Transportation (Non-Medical): No  Physical Activity: Not on file  Stress: Stress Concern Present (05/07/2022)   [employer] of Occupational Health - Occupational Stress Questionnaire    Feeling of Stress : Very much  Social Connections: Not on file  Intimate Partner Violence: Not on file    FAMILY HISTORY: Family History  Problem Relation Age of Onset   Melanoma Father 74 - 63       dx. again in his early 75s   Breast cancer Paternal Grandmother 38 - 83    Review of Systems  Constitutional:  Positive for fatigue. Negative for appetite change, chills, fever and unexpected weight change.  HENT:   Negative for hearing loss, lump/mass, mouth sores and trouble swallowing.   Eyes:  Negative for eye problems and icterus.  Respiratory:  Negative for chest tightness, cough and shortness of breath.   Cardiovascular:  Negative for chest  pain, leg swelling and palpitations.  Gastrointestinal:  Negative for abdominal distention, abdominal pain, constipation, diarrhea, nausea and vomiting.  Endocrine: Negative for hot flashes.  Genitourinary:  Negative for difficulty urinating.   Musculoskeletal:  Negative for arthralgias.  Skin:  Negative for itching and rash.  Neurological:  Negative for dizziness, extremity weakness, headaches and numbness.  Hematological:  Negative for adenopathy. Does not bruise/bleed easily.  Psychiatric/Behavioral:  Negative for depression. The patient is not nervous/anxious.       PHYSICAL EXAMINATION    Vitals:   08/13/22 1217  BP: 115/87  Pulse: 72  Resp: 16  Temp: 97.9 F (36.6 C)  SpO2: 99%     Physical Exam Constitutional:      General: She is not in acute distress.    Appearance: Normal appearance. She is not toxic-appearing.  HENT:     Head: Normocephalic and atraumatic.     Mouth/Throat:     Pharynx: No oropharyngeal exudate or posterior oropharyngeal erythema.  Eyes:     General: No scleral icterus. Pulmonary:     Effort: Pulmonary effort is normal.  Breath sounds: Normal breath sounds.  Chest:     Comments: Breast exam deferred Abdominal:     General: Bowel sounds are normal. There is no distension.     Tenderness: There is no abdominal tenderness.  Musculoskeletal:        General: No swelling (resolved).     Cervical back: Neck supple.  Lymphadenopathy:     Cervical: No cervical adenopathy.  Skin:    Findings: Rash (rash has resolved) present.  Neurological:     General: No focal deficit present.     Mental Status: She is alert.  Psychiatric:        Mood and Affect: Mood normal.        Behavior: Behavior normal.     LABORATORY DATA:  CBC    Component Value Date/Time   WBC 8.4 08/13/2022 1134   WBC 6.8 06/24/2022 1352   RBC 3.46 (L) 08/13/2022 1134   HGB 11.3 (L) 08/13/2022 1134   HCT 33.5 (L) 08/13/2022 1134   PLT 334 08/13/2022 1134   MCV  96.8 08/13/2022 1134   MCH 32.7 08/13/2022 1134   MCHC 33.7 08/13/2022 1134   RDW 16.4 (H) 08/13/2022 1134   LYMPHSABS 2.8 08/13/2022 1134   MONOABS 1.3 (H) 08/13/2022 1134   EOSABS 0.0 08/13/2022 1134   BASOSABS 0.0 08/13/2022 1134    CMP     Component Value Date/Time   NA 137 08/13/2022 1134   K 3.8 08/13/2022 1134   CL 103 08/13/2022 1134   CO2 27 08/13/2022 1134   GLUCOSE 76 08/13/2022 1134   BUN 10 08/13/2022 1134   CREATININE 0.64 08/13/2022 1134   CALCIUM 9.6 08/13/2022 1134   PROT 6.0 (L) 08/13/2022 1134   ALBUMIN 3.9 08/13/2022 1134   AST 17 08/13/2022 1134   ALT 23 08/13/2022 1134   ALKPHOS 52 08/13/2022 1134   BILITOT 0.2 (L) 08/13/2022 1134   GFRNONAA >60 08/13/2022 1134    ASSESSMENT and THERAPY PLAN:   Malignant neoplasm of upper-inner quadrant of right breast in female, estrogen receptor positive (HCC) This is a very pleasant 42 year old female patient with newly diagnosed right breast high-grade invasive ductal carcinoma, ER +40% weak staining, PR 0% negative, HER2 negative, Ki-67 of 80% referred to breast [facility] for additional recommendations.  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  With tumor size larger than 2 cm, we have discussed about considering neoadjuvant chemotherapy based on keynote 522 study.  Other option would be to do neoadjuvant non anthracycline-based regimen since she has node-negative disease.   Given high-grade, high proliferation index and weak staining of estrogen receptor, we attempted to treat with Key note 522 regimen. She was then found to have inflammatory arthralgias from immunotherapy.  Hence we have omitted immunotherapy.  She is now only on Adriamycin and cyclophosphamide.  She has been tapered off of prednisone about 2 days ago and continues to do well without any recurrence of arthralgias or skin rash.  She had some mild left eye inflammation which has  spontaneously resolved.  She has noticed some severe hot flashes which have been keeping her awake and wondered if she can take something for it.  Other than this, she has been using some extra magnesium for muscle cramps, eating potassium rich foods and hydrating well.  She will proceed with chemotherapy today and return to clinic in about 3 weeks for her next cycle of chemotherapy  She will start on  Effexor 37.5 mg daily for vasomotor symptoms of menopause.  All questions were answered. The patient knows to call the clinic with any problems, questions or concerns. We can certainly see the patient much sooner if necessary.  Total encounter time:30 minutes*in face-to-face visit time, chart review, lab review, care coordination, order entry, and documentation of the encounter time.    *Total Encounter Time as defined by the Centers for Medicare and Medicaid Services includes, in addition to the face-to-face time of a patient visit (documented in the note above) non-face-to-face time: obtaining and reviewing outside history, ordering and reviewing medications, tests or procedures, care coordination (communications with other health care professionals or caregivers) and documentation in the medical record.

## 2022-08-15 ENCOUNTER — Ambulatory Visit: Payer: BC Managed Care – PPO

## 2022-08-15 LAB — T4: T4, Total: 6.5 ug/dL (ref 4.5–12.0)

## 2022-08-18 ENCOUNTER — Other Ambulatory Visit: Payer: Self-pay | Admitting: Adult Health

## 2022-08-18 ENCOUNTER — Telehealth: Payer: Self-pay | Admitting: *Deleted

## 2022-08-18 ENCOUNTER — Encounter: Payer: Self-pay | Admitting: Hematology and Oncology

## 2022-08-18 NOTE — Telephone Encounter (Signed)
Contacted patient in response to MyChart messages sent 7/8. She is having hot flashes that interfere with sleep - only took 2 doses of Effexor last week and then no more due to feeling bad. Nauseated and has sore throat - ran out of ondansetron and just took a few doses of prochlorperazine and then no more when she became constipated. She said she took a laxative for the constipation and had bm. Ms Francesconi states she is drinking water and eating small amounts of soft foods like bananas and yogurt, but some foods give her heartburn.  Patient offered and scheduled for appt tomorrow, 7/9 at 0940 with Georga Kaufmann, PA for evaluation.  This message routed as FYI to Ms. Thayil, PA and Dr. Al Pimple.

## 2022-08-19 ENCOUNTER — Inpatient Hospital Stay (HOSPITAL_BASED_OUTPATIENT_CLINIC_OR_DEPARTMENT_OTHER): Payer: BC Managed Care – PPO | Admitting: Physician Assistant

## 2022-08-19 ENCOUNTER — Inpatient Hospital Stay: Payer: BC Managed Care – PPO

## 2022-08-19 ENCOUNTER — Other Ambulatory Visit: Payer: Self-pay

## 2022-08-19 ENCOUNTER — Telehealth: Payer: Self-pay

## 2022-08-19 ENCOUNTER — Other Ambulatory Visit (HOSPITAL_COMMUNITY): Payer: Self-pay

## 2022-08-19 VITALS — BP 117/90 | HR 91 | Temp 97.9°F | Resp 16 | Wt 138.2 lb

## 2022-08-19 DIAGNOSIS — R5383 Other fatigue: Secondary | ICD-10-CM | POA: Diagnosis not present

## 2022-08-19 DIAGNOSIS — R112 Nausea with vomiting, unspecified: Secondary | ICD-10-CM | POA: Diagnosis not present

## 2022-08-19 DIAGNOSIS — C50211 Malignant neoplasm of upper-inner quadrant of right female breast: Secondary | ICD-10-CM | POA: Diagnosis not present

## 2022-08-19 DIAGNOSIS — K5900 Constipation, unspecified: Secondary | ICD-10-CM

## 2022-08-19 DIAGNOSIS — Z170 Estrogen receptor positive status [ER+]: Secondary | ICD-10-CM

## 2022-08-19 DIAGNOSIS — K1230 Oral mucositis (ulcerative), unspecified: Secondary | ICD-10-CM

## 2022-08-19 LAB — CMP (CANCER CENTER ONLY)
ALT: 16 U/L (ref 0–44)
AST: 12 U/L — ABNORMAL LOW (ref 15–41)
Albumin: 4 g/dL (ref 3.5–5.0)
Alkaline Phosphatase: 56 U/L (ref 38–126)
Anion gap: 7 (ref 5–15)
BUN: 12 mg/dL (ref 6–20)
CO2: 26 mmol/L (ref 22–32)
Calcium: 9.5 mg/dL (ref 8.9–10.3)
Chloride: 104 mmol/L (ref 98–111)
Creatinine: 0.68 mg/dL (ref 0.44–1.00)
GFR, Estimated: >60 mL/min (ref >60)
Glucose, Bld: 101 mg/dL — ABNORMAL HIGH (ref 70–99)
Potassium: 3.6 mmol/L (ref 3.5–5.1)
Sodium: 137 mmol/L (ref 135–145)
Total Bilirubin: 0.5 mg/dL (ref 0.3–1.2)
Total Protein: 6.7 g/dL (ref 6.5–8.1)

## 2022-08-19 LAB — CBC WITH DIFFERENTIAL (CANCER CENTER ONLY)
Abs Immature Granulocytes: 0.09 10*3/uL — ABNORMAL HIGH (ref 0.00–0.07)
Basophils Absolute: 0 10*3/uL (ref 0.0–0.1)
Basophils Relative: 0 %
Eosinophils Absolute: 0 10*3/uL (ref 0.0–0.5)
Eosinophils Relative: 0 %
HCT: 32.6 % — ABNORMAL LOW (ref 36.0–46.0)
Hemoglobin: 11.6 g/dL — ABNORMAL LOW (ref 12.0–15.0)
Immature Granulocytes: 1 %
Lymphocytes Relative: 14 %
Lymphs Abs: 1 10*3/uL (ref 0.7–4.0)
MCH: 33.5 pg (ref 26.0–34.0)
MCHC: 35.6 g/dL (ref 30.0–36.0)
MCV: 94.2 fL (ref 80.0–100.0)
Monocytes Absolute: 0.1 10*3/uL (ref 0.1–1.0)
Monocytes Relative: 1 %
Neutro Abs: 5.9 10*3/uL (ref 1.7–7.7)
Neutrophils Relative %: 84 %
Platelet Count: 246 10*3/uL (ref 150–400)
RBC: 3.46 MIL/uL — ABNORMAL LOW (ref 3.87–5.11)
RDW: 15.1 % (ref 11.5–15.5)
WBC Count: 7 10*3/uL (ref 4.0–10.5)
nRBC: 0 % (ref 0.0–0.2)

## 2022-08-19 MED ORDER — STERILE WATER FOR INJECTION IJ SOLN
OROMUCOSAL | 1 refills | Status: DC
  Filled 2022-08-19: qty 300, 14d supply, fill #0
  Filled 2022-08-29: qty 300, 14d supply, fill #1

## 2022-08-19 MED ORDER — SODIUM CHLORIDE 0.9 % IV SOLN
12.5000 mg | Freq: Once | INTRAVENOUS | Status: AC
  Administered 2022-08-19: 12.5 mg via INTRAVENOUS
  Filled 2022-08-19: qty 0.5

## 2022-08-19 MED ORDER — OLANZAPINE 5 MG PO TABS: 5.0000 mg | ORAL_TABLET | Freq: Every day | ORAL | 0 refills | Status: DC

## 2022-08-19 MED ORDER — SODIUM CHLORIDE 0.9 % IV SOLN: INTRAVENOUS | Status: AC

## 2022-08-19 NOTE — Patient Instructions (Signed)

## 2022-08-19 NOTE — Progress Notes (Unsigned)
[facility] [phone]   [phone]  PROGRESS NOTE  Patient Care Team: Marva Panda, NP as PCP - General Rachel Moulds, MD as Consulting Physician (Hematology and Oncology) Dorothy Puffer, MD as Consulting Physician (Radiation Oncology) Griselda Miner, MD as Consulting Physician (General Surgery) Pershing Proud, RN as Oncology Nurse Navigator Donnelly Angelica, RN as Oncology Nurse Navigator Causey, Larna Daughters, NP as Nurse Practitioner (Hematology and Oncology) Rachel Moulds, MD as Consulting Physician (Hematology and Oncology) Rachel Moulds, MD as Consulting Physician (Hematology and Oncology)  CHIEF COMPLAINTS:  Right breast cancer-s/p Cycle 5 of adriamycin/cytoxan chemotherapy on 08/13/2022.   INTERVAL HISTORY: Toiya Ziemann presents for a symptom management visit. She was last seen by Dr. Al Pimple on 08/13/2022 and received her last dose of chemotherapy with adriamycin and cytoxan.   Ms. Bridger reports that she has been fatigued the next day after her last chemotherapy. She is struggling with hot flashes which is preventing her from sleeping. She sleeps approximately 1 hour at a time. She took Effexor for a couple of days but stopped because she wasn't sure if her fatigue and nausea was secondary to the medication. She is having persistent nausea with occasional episodes of vomiting. She experienced two episodes of vomiting today. She takes zofran once a day and stopped taking compazine due to constipation. She developed constipation with compazine which improved with taking a laxative. She reports feeling jittery and her joint pain is returning mainly in her ankles, knees and left hip. She denies fevers, chills, shortness of breath, chest pain or cough. Rest of the ROS is reviewed and negative.   MEDICAL HISTORY:  Past Medical History:  Diagnosis Date   Cancer (HCC)    Migraines     SURGICAL HISTORY: Past Surgical History:   Procedure Laterality Date   BREAST BIOPSY Right 04/25/2022   [place] RT BREAST BX W LOC DEV 1ST LESION IMG BX SPEC US GUIDE 04/25/2022 [facility]GRAP[facility]   IR IMAGING GUIDED PORT INSERTION  05/21/2022    SOCIAL HISTORY: Social History   Socioeconomic History   Marital status: Single    Spouse name: Not on file   Number of children: Not on file   Years of education: Not on file   Highest education level: Not on file  Occupational History   Not on file  Tobacco Use   Smoking status: Former    Packs/day: .5    Types: Cigarettes    Quit date: 04/29/2022    Years since quitting: 0.3   Smokeless tobacco: Never  Vaping Use   Vaping Use: Never used  Substance and Sexual Activity   Alcohol use: Yes   Drug use: Never   Sexual activity: Yes    Birth control/protection: Surgical    Comment: MRI 05-16-22 Left Breast Lump found  Other Topics Concern   Not on file  Social History Narrative   Not on file   Social Determinants of Health   Financial Resource Strain: Medium Risk (05/07/2022)   Overall Financial Resource Strain (CARDIA)    Difficulty of Paying Living Expenses: Somewhat hard  Food Insecurity: No Food Insecurity (05/07/2022)   Hunger Vital Sign    Worried About Running Out of Food in the Last Year: Never true    Ran Out of Food in the Last Year: Never true  Transportation Needs: No Transportation Needs (05/07/2022)   PRAPARE - Administrator, Civil Service (Medical): No    Lack of Transportation (Non-Medical): No  Physical Activity: Not on file  Stress: Stress Concern Present (05/07/2022)   [employer] of Occupational Health - Occupational Stress Questionnaire    Feeling of Stress : Very much  Social Connections: Not on file  Intimate Partner Violence: Not on file    FAMILY HISTORY: Family History  Problem Relation Age of Onset   Melanoma Father 53 - 73       dx. again in his early 25s   Breast cancer Paternal Grandmother 80 - 88    ALLERGIES:  is  allergic to contrast media [iodinated contrast media], venlafaxine, amitriptyline, iodine, methocarbamol, and tartrazine.  MEDICATIONS:  Current Outpatient Medications  Medication Sig Dispense Refill   OLANZapine (ZYPREXA) 5 MG tablet Take 1 tablet (5 mg total) by mouth at bedtime. 30 tablet 0   ciprofloxacin (CILOXAN) 0.3 % ophthalmic solution Place 2 drops into both eyes every 4 (four) hours while awake. Administer 1 drop, every 2 hours, while awake, for 2 days. Then 1 drop, every 4 hours, while awake, for the next 5 days. 5 mL 0   dexamethasone (DECADRON) 4 MG tablet Take 2 tablets daily for 2 days, start the day after chemotherapy. Take with food. 30 tablet 1   fluticasone (FLONASE) 50 MCG/ACT nasal spray Place 2 sprays into both nostrils daily. OTC from [employer]     HYDROcodone-acetaminophen (NORCO/VICODIN) 5-325 MG tablet Take 1 tablet by mouth every 8 (eight) hours as needed for moderate pain. 30 tablet 0   lidocaine (XYLOCAINE) 2 % solution Use as directed 15 mLs in the mouth or throat as needed for mouth pain. 100 mL 0   lidocaine-prilocaine (EMLA) cream Apply to affected area once 30 g 3   ondansetron (ZOFRAN) 8 MG tablet Take 1 tablet (8 mg total) by mouth every 8 (eight) hours as needed for nausea or vomiting. Start on the third day after chemotherapy. (Patient not taking: Reported on 06/12/2022) 30 tablet 1   predniSONE (DELTASONE) 5 MG tablet Take 4 tablets (20 mg total) by mouth daily with breakfast. 60 tablet 0   prochlorperazine (COMPAZINE) 10 MG tablet Take 1 tablet (10 mg total) by mouth every 6 (six) hours as needed for nausea or vomiting. (Patient not taking: Reported on 06/12/2022) 30 tablet 1   tiZANidine (ZANAFLEX) 2 MG tablet TAKE 1 TABLET(2 MG) BY MOUTH AT BEDTIME AS NEEDED FOR MUSCLE SPASMS 20 tablet 0   venlafaxine XR (EFFEXOR-XR) 37.5 MG 24 hr capsule Take 1 capsule (37.5 mg total) by mouth daily with breakfast. 30 capsule 1   zolpidem (AMBIEN) 5 MG tablet Take 1 tablet (5 mg  total) by mouth at bedtime as needed for sleep. 6 tablet 0   No current facility-administered medications for this visit.   Facility-Administered Medications Ordered in Other Visits  Medication Dose Route Frequency Provider Last Rate Last Admin   0.9 %  sodium chloride infusion   Intravenous Continuous Tifanny Dollens T, PA-C       promethazine (PHENERGAN) 12.5 mg in sodium chloride 0.9 % 50 mL IVPB  12.5 mg Intravenous Once Liberty Seto T, PA-C        PHYSICAL EXAMINATION: ECOG PERFORMANCE STATUS: 1 - Symptomatic but completely ambulatory  Vitals:   08/19/22 0953  BP: (!) 117/90  Pulse: 91  Resp: 16  Temp: 97.9 F (36.6 C)  SpO2: 100%   Filed Weights   08/19/22 0953  Weight: 138 lb 3.2 oz (62.7 kg)    SKIN: skin color, texture, turgor are normal, no rashes or  significant lesions EYES: conjunctiva are pink and non-injected, sclera clear OROPHARYNX: no exudate, no erythema; lips, buccal mucosa. Thrush noted on tongue LUNGS: clear to auscultation and percussion with normal breathing effort HEART: regular rate & rhythm and no murmurs and no lower extremity edema Musculoskeletal: no cyanosis of digits and no clubbing  PSYCH: alert & oriented x 3, fluent speech NEURO: no focal motor/sensory deficits.   LABORATORY DATA:  I have reviewed the data as listed    Latest Ref Rng & Units 08/13/2022   11:34 AM 07/24/2022    9:19 AM 07/17/2022    9:13 AM  CBC  WBC 4.0 - 10.5 K/uL 8.4  8.2  5.7   Hemoglobin 12.0 - 15.0 g/dL 16.1  09.6  04.5   Hematocrit 36.0 - 46.0 % 33.5  35.4  32.9   Platelets 150 - 400 K/uL 334  200  196        Latest Ref Rng & Units 08/13/2022   11:34 AM 07/24/2022    9:19 AM 07/17/2022    9:13 AM  CMP  Glucose 70 - 99 mg/dL 76  91  96   BUN 6 - 20 mg/dL 10  10  9    Creatinine 0.44 - 1.00 mg/dL 4.09  8.11  9.14   Sodium 135 - 145 mmol/L 137  139  139   Potassium 3.5 - 5.1 mmol/L 3.8  3.7  4.0   Chloride 98 - 111 mmol/L 103  105  107   CO2 22 - 32 mmol/L 27  26   27    Calcium 8.9 - 10.3 mg/dL 9.6  9.6  9.0   Total Protein 6.5 - 8.1 g/dL 6.0  6.6  6.0   Total Bilirubin 0.3 - 1.2 mg/dL 0.2  0.4  0.4   Alkaline Phos 38 - 126 U/L 52  51  48   AST 15 - 41 U/L 17  23  18    ALT 0 - 44 U/L 23  40  29     ASSESSMENT & PLAN Vanessa Murphy is a 42 y.o. female who presents to the clinic for symptom management visit.   #Right breast cancer: --Under the care of Dr. Al Pimple --S/P Cycle 5, Day 1 of Adriamycin and cytoxan chemotherapy on 08/13/2022. --Plan to follow up with Dr. Al Pimple on 09/04/2022 before Cycle 6  #Fatigue: --Likely secondary to chemotherapy. --Labs from today were reviewed and require no intervention. --Monitor and reassess before next dose of treatment for any dose adjustments. I will notify Dr. Al Pimple.   #Nausea/Vomiting --Takes zofran 8 mg once a day. Patient is hesitant to take more due to risk of constipation --Reports constipation with Compazine that she took for several doses.  --Sent Zyprexa 5 mg PO nightly.  --Will arrange for IV fluids + IV phenergan  #Constipation --Advised to take stool softeners such as Senna-S 1-2 tablets as needed for constipation  #Oral mucositis/thrush: --Sent prescription of Magic Mouthwash.  Orders Placed This Encounter  Procedures   CBC with Differential (Cancer Center Only)    Standing Status:   Future    Number of Occurrences:   1    Standing Expiration Date:   08/19/2023   CMP (Cancer Center only)    Standing Status:   Future    Number of Occurrences:   1    Standing Expiration Date:   08/19/2023    All questions were answered. The patient knows to call the clinic with any problems, questions or concerns.  I have  spent a total of 30 minutes minutes of face-to-face and non-face-to-face time, preparing to see the patient, performing a medically appropriate examination, counseling and educating the patient, ordering medications/tests/procedures, documenting clinical information in the electronic  health record, and care coordination.   Georga Kaufmann, PA-C Department of Hematology/Oncology [facility] Phone: [phone]

## 2022-08-19 NOTE — Telephone Encounter (Signed)
T/C to pt to f/up after receiving IV fluids today.  Pt stated she is at least able to sleep but when she wakes up she is still nauseated.  Advised her a prescription for nausea meds have been sent to her pharmacy.  Pt advised there is no lower dose to the Effexor and if she wanted to try something different or try the Effexor again.  She agreed to try the Effexor again.  She will call back if symptoms do not resolve

## 2022-08-19 NOTE — Telephone Encounter (Signed)
MAGIC MOUTHWASH W/LIDOCAINE SOLUTION Sig: Dispense 400 ml of magic mouthwash compounded preparation.  Patient is to use 5 ml 4 times a day as needed for throat discomfort. 80 ml viscous lidocaine 2% 80 ml Mylanta 80 ml diphenhydramine at 12.5 mg per 5 ml elixir 80 ml nystatin at 100,000 U per 5 ml suspension 80 ml distilled water  Refill: one  SWISH AND SPIT OR SWISH AND SWALLOW  RX called to [facility]

## 2022-08-21 ENCOUNTER — Other Ambulatory Visit: Payer: Self-pay | Admitting: *Deleted

## 2022-08-21 ENCOUNTER — Encounter: Payer: Self-pay | Admitting: Hematology and Oncology

## 2022-08-21 ENCOUNTER — Inpatient Hospital Stay: Payer: BC Managed Care – PPO

## 2022-08-21 VITALS — BP 124/70 | HR 78 | Temp 97.9°F | Resp 16

## 2022-08-21 DIAGNOSIS — Z170 Estrogen receptor positive status [ER+]: Secondary | ICD-10-CM

## 2022-08-21 DIAGNOSIS — R110 Nausea: Secondary | ICD-10-CM

## 2022-08-21 DIAGNOSIS — C50211 Malignant neoplasm of upper-inner quadrant of right female breast: Secondary | ICD-10-CM | POA: Diagnosis not present

## 2022-08-21 DIAGNOSIS — Z95828 Presence of other vascular implants and grafts: Secondary | ICD-10-CM

## 2022-08-21 MED ORDER — SODIUM CHLORIDE 0.9 % IV SOLN: INTRAVENOUS | Status: AC

## 2022-08-21 MED ORDER — ONDANSETRON HCL 4 MG/2ML IJ SOLN
8.0000 mg | Freq: Once | INTRAMUSCULAR | Status: AC
  Administered 2022-08-21: 8 mg via INTRAVENOUS
  Filled 2022-08-21: qty 4

## 2022-08-21 MED ORDER — HEPARIN SOD (PORK) LOCK FLUSH 100 UNIT/ML IV SOLN
500.0000 [IU] | Freq: Once | INTRAVENOUS | Status: AC
  Administered 2022-08-21: 500 [IU] via INTRAVENOUS

## 2022-08-21 MED ORDER — SODIUM CHLORIDE 0.9 % IV SOLN: 4.0000 mg | Freq: Once | INTRAVENOUS | Status: DC

## 2022-08-21 MED ORDER — ACETAMINOPHEN 325 MG PO TABS
650.0000 mg | ORAL_TABLET | Freq: Four times a day (QID) | ORAL | Status: DC | PRN
  Administered 2022-08-21: 650 mg via ORAL
  Filled 2022-08-21: qty 2

## 2022-08-21 MED ORDER — DEXAMETHASONE SODIUM PHOSPHATE 10 MG/ML IJ SOLN
4.0000 mg | Freq: Once | INTRAMUSCULAR | Status: AC
  Administered 2022-08-21: 4 mg via INTRAVENOUS
  Filled 2022-08-21: qty 1

## 2022-08-21 MED ORDER — SODIUM CHLORIDE 0.9% FLUSH
10.0000 mL | Freq: Once | INTRAVENOUS | Status: AC
  Administered 2022-08-21: 10 mL via INTRAVENOUS

## 2022-08-21 NOTE — Patient Instructions (Signed)

## 2022-08-21 NOTE — Progress Notes (Signed)
Pt reported to infusion today for IVFs. Pt had c/o not eating d/t nausea. Pt stated "I had a few bites at dinner last night (08/20/2022), but it made me nauseous and I had to stop. I have not eaten today." This RN made Dr. Al Pimple and Val RN aware. Orders placed for zofran and dexamethasone to be given while in infusion.   At 1400 Pt c/o a sudden onset headache above her right eye 9/10 pain. This RN made Dr. Al Pimple aware. Orders placed for tylenol.   After Pt ate crackers in infusion, Pt had c/o a feeling described as "acid reflux" at the top of her abdomen. This RN made Dr. Al Pimple aware. Dr. Al Pimple recommended Pt take Pepcid PO at home for this feeling. This RN informed Pt and Pt verbalized understanding and was agreeable to MD's recommendations.

## 2022-08-23 ENCOUNTER — Inpatient Hospital Stay: Payer: BC Managed Care – PPO

## 2022-08-23 VITALS — BP 118/88 | HR 77 | Temp 98.9°F | Resp 12

## 2022-08-23 DIAGNOSIS — Z95828 Presence of other vascular implants and grafts: Secondary | ICD-10-CM

## 2022-08-23 DIAGNOSIS — C50211 Malignant neoplasm of upper-inner quadrant of right female breast: Secondary | ICD-10-CM | POA: Diagnosis not present

## 2022-08-23 DIAGNOSIS — R110 Nausea: Secondary | ICD-10-CM

## 2022-08-23 MED ORDER — SODIUM CHLORIDE 0.9% FLUSH
10.0000 mL | Freq: Once | INTRAVENOUS | Status: AC
  Administered 2022-08-23: 10 mL

## 2022-08-23 MED ORDER — SODIUM CHLORIDE 0.9 % IV SOLN: INTRAVENOUS | Status: AC

## 2022-08-23 MED ORDER — HEPARIN SOD (PORK) LOCK FLUSH 100 UNIT/ML IV SOLN
500.0000 [IU] | Freq: Once | INTRAVENOUS | Status: AC
  Administered 2022-08-23: 500 [IU]

## 2022-08-25 ENCOUNTER — Encounter: Payer: Self-pay | Admitting: Hematology and Oncology

## 2022-08-26 ENCOUNTER — Other Ambulatory Visit: Payer: Self-pay | Admitting: *Deleted

## 2022-08-26 DIAGNOSIS — Z170 Estrogen receptor positive status [ER+]: Secondary | ICD-10-CM

## 2022-08-27 ENCOUNTER — Encounter: Payer: Self-pay | Admitting: Hematology and Oncology

## 2022-08-27 ENCOUNTER — Inpatient Hospital Stay: Payer: BC Managed Care – PPO | Admitting: Hematology and Oncology

## 2022-08-27 VITALS — BP 125/89 | HR 76 | Temp 97.7°F | Resp 18 | Ht 64.0 in | Wt 144.1 lb

## 2022-08-27 DIAGNOSIS — Z170 Estrogen receptor positive status [ER+]: Secondary | ICD-10-CM

## 2022-08-27 DIAGNOSIS — C50211 Malignant neoplasm of upper-inner quadrant of right female breast: Secondary | ICD-10-CM

## 2022-08-27 MED ORDER — PREDNISONE 10 MG PO TABS: 10.0000 mg | ORAL_TABLET | Freq: Every day | ORAL | 0 refills | Status: DC

## 2022-08-27 MED ORDER — HYDROCODONE-ACETAMINOPHEN 5-325 MG PO TABS: 1.0000 | ORAL_TABLET | Freq: Three times a day (TID) | ORAL | 0 refills | Status: DC | PRN

## 2022-08-27 NOTE — Progress Notes (Signed)
[facility] [phone]   [phone]  PROGRESS NOTE  Patient Care Team: Elie Confer, NP as PCP - Almyra Deforest, MD as Consulting Physician (Hematology and Oncology) Dorothy Puffer, MD as Consulting Physician (Radiation Oncology) Griselda Miner, MD as Consulting Physician (General Surgery) Pershing Proud, RN as Oncology Nurse Navigator Donnelly Angelica, RN as Oncology Nurse Navigator Causey, Larna Daughters, NP as Nurse Practitioner (Hematology and Oncology) Rachel Moulds, MD as Consulting Physician (Hematology and Oncology) Rachel Moulds, MD as Consulting Physician (Hematology and Oncology)  CHIEF COMPLAINTS:  Right breast cancer-s/p Cycle 5 of adriamycin/cytoxan chemotherapy on 08/13/2022.   INTERVAL HISTORY: Ms. Ailes is here for an unexpected visit since she continues to have joint pains and swelling.  She totally received 3 cycles of CarboTaxol with Keytruda and then 2 cycles of Adriamycin and cyclophosphamide so far.  She has had an excellent clinical response but continues to struggle with chemotherapy related side effects.  She was thought to have arthralgias secondary to immunotherapy and went on a prednisone taper and did very well.  She stopped the steroids about 2 or 3 weeks ago and now has recurrent joint pains, swelling in her ankle joints.  She is extremely worried about the side effects.  She is at the point where she wants to stop the chemotherapy and move forward with surgery if possible.  She is requesting some medication for the joint pain.  She also felt very nauseated and very foggy after her last cycle of chemotherapy.  Rest of the pertinent 10 point ROS reviewed and negative  MEDICAL HISTORY:  Past Medical History:  Diagnosis Date   Cancer (HCC)    Migraines     SURGICAL HISTORY: Past Surgical History:  Procedure Laterality Date   BREAST BIOPSY Right 04/25/2022   [place] RT BREAST BX W LOC DEV 1ST LESION IMG  BX SPEC US GUIDE 04/25/2022 [facility]   IR IMAGING GUIDED PORT INSERTION  05/21/2022    SOCIAL HISTORY: Social History   Socioeconomic History   Marital status: Single    Spouse name: Not on file   Number of children: Not on file   Years of education: Not on file   Highest education level: Not on file  Occupational History   Not on file  Tobacco Use   Smoking status: Former    Current packs/day: 0.00    Types: Cigarettes    Quit date: 04/29/2022    Years since quitting: 0.3   Smokeless tobacco: Never  Vaping Use   Vaping status: Never Used  Substance and Sexual Activity   Alcohol use: Yes   Drug use: Never   Sexual activity: Yes    Birth control/protection: Surgical    Comment: MRI 05-16-22 Left Breast Lump found  Other Topics Concern   Not on file  Social History Narrative   Not on file   Social Determinants of Health   Financial Resource Strain: Medium Risk (05/07/2022)   Overall Financial Resource Strain (CARDIA)    Difficulty of Paying Living Expenses: Somewhat hard  Food Insecurity: No Food Insecurity (05/07/2022)   Hunger Vital Sign    Worried About Running Out of Food in the Last Year: Never true    Ran Out of Food in the Last Year: Never true  Transportation Needs: No Transportation Needs (05/07/2022)   PRAPARE - Administrator, Civil Service (Medical): No    Lack of Transportation (Non-Medical): No  Physical Activity: Not on  file  Stress: Stress Concern Present (05/07/2022)   [employer] of Occupational Health - Occupational Stress Questionnaire    Feeling of Stress : Very much  Social Connections: Not on file  Intimate Partner Violence: Low Risk  (05/24/2019)   Received from [facility]   Intimate Partner Violence    Insults You: Not on file    Threatens You: Not on file    Screams at [place]: Not on file    Physically Hurt: Not on file    Intimate Partner Violence Score: Not on file    FAMILY HISTORY: Family History  Problem  Relation Age of Onset   Melanoma Father 65 - 5       dx. again in his early 75s   Breast cancer Paternal Grandmother 60 - 20    ALLERGIES:  is allergic to contrast media [iodinated contrast media], venlafaxine, amitriptyline, iodine, methocarbamol, and tartrazine.  MEDICATIONS:  Current Outpatient Medications  Medication Sig Dispense Refill   ciprofloxacin (CILOXAN) 0.3 % ophthalmic solution Place 2 drops into both eyes every 4 (four) hours while awake. Administer 1 drop, every 2 hours, while awake, for 2 days. Then 1 drop, every 4 hours, while awake, for the next 5 days. 5 mL 0   dexamethasone (DECADRON) 4 MG tablet Take 2 tablets daily for 2 days, start the day after chemotherapy. Take with food. 30 tablet 1   fluticasone (FLONASE) 50 MCG/ACT nasal spray Place 2 sprays into both nostrils daily. OTC from [employer]     HYDROcodone-acetaminophen (NORCO/VICODIN) 5-325 MG tablet Take 1 tablet by mouth every 8 (eight) hours as needed for moderate pain. 30 tablet 0   lidocaine (XYLOCAINE) 2 % solution Use as directed 15 mLs in the mouth or throat as needed for mouth pain. 100 mL 0   lidocaine-prilocaine (EMLA) cream Apply to affected area once 30 g 3   magic mouthwash (multi-ingredient) oral suspension Swish and swallow 5ml  4 times daily as needed for throat discomfort 400 mL 1   OLANZapine (ZYPREXA) 5 MG tablet Take 1 tablet (5 mg total) by mouth at bedtime. 30 tablet 0   ondansetron (ZOFRAN) 8 MG tablet Take 1 tablet (8 mg total) by mouth every 8 (eight) hours as needed for nausea or vomiting. Start on the third day after chemotherapy. (Patient not taking: Reported on 06/12/2022) 30 tablet 1   predniSONE (DELTASONE) 5 MG tablet Take 4 tablets (20 mg total) by mouth daily with breakfast. 60 tablet 0   prochlorperazine (COMPAZINE) 10 MG tablet Take 1 tablet (10 mg total) by mouth every 6 (six) hours as needed for nausea or vomiting. (Patient not taking: Reported on 06/12/2022) 30 tablet 1   tiZANidine  (ZANAFLEX) 2 MG tablet TAKE 1 TABLET(2 MG) BY MOUTH AT BEDTIME AS NEEDED FOR MUSCLE SPASMS 20 tablet 0   venlafaxine XR (EFFEXOR-XR) 37.5 MG 24 hr capsule Take 1 capsule (37.5 mg total) by mouth daily with breakfast. 30 capsule 1   zolpidem (AMBIEN) 5 MG tablet Take 1 tablet (5 mg total) by mouth at bedtime as needed for sleep. 6 tablet 0   No current facility-administered medications for this visit.    PHYSICAL EXAMINATION: ECOG PERFORMANCE STATUS: 1 - Symptomatic but completely ambulatory  Vitals:   08/27/22 1604  BP: (!) 141/110  Pulse: 76  Resp: 18  Temp: 97.7 F (36.5 C)  SpO2: 100%   Filed Weights   08/27/22 1604  Weight: 144 lb 1.6 oz (65.4 kg)    SKIN:  skin color, texture, turgor are normal, no rashes or significant lesions Bilateral lower extremity swelling, no significant asymmetry.  Ankle joint painful and tender. She once again is very tearful and emotional and worried about the adverse effects of chemotherapy.  LABORATORY DATA:  I have reviewed the data as listed    Latest Ref Rng & Units 08/19/2022   10:36 AM 08/13/2022   11:34 AM 07/24/2022    9:19 AM  CBC  WBC 4.0 - 10.5 K/uL 7.0  8.4  8.2   Hemoglobin 12.0 - 15.0 g/dL 40.9  81.1  91.4   Hematocrit 36.0 - 46.0 % 32.6  33.5  35.4   Platelets 150 - 400 K/uL 246  334  200        Latest Ref Rng & Units 08/19/2022   10:36 AM 08/13/2022   11:34 AM 07/24/2022    9:19 AM  CMP  Glucose 70 - 99 mg/dL 782  76  91   BUN 6 - 20 mg/dL 12  10  10    Creatinine 0.44 - 1.00 mg/dL 9.56  2.13  0.86   Sodium 135 - 145 mmol/L 137  137  139   Potassium 3.5 - 5.1 mmol/L 3.6  3.8  3.7   Chloride 98 - 111 mmol/L 104  103  105   CO2 22 - 32 mmol/L 26  27  26    Calcium 8.9 - 10.3 mg/dL 9.5  9.6  9.6   Total Protein 6.5 - 8.1 g/dL 6.7  6.0  6.6   Total Bilirubin 0.3 - 1.2 mg/dL 0.5  0.2  0.4   Alkaline Phos 38 - 126 U/L 56  52  51   AST 15 - 41 U/L 12  17  23    ALT 0 - 44 U/L 16  23  40     ASSESSMENT & PLAN Verlin  Lick is a 42 y.o. female who presents to the clinic for symptom management visit.   #Right breast cancer: --Under the care of Dr. Al Pimple --S/P Cycle 5, Day 1 of Adriamycin and cytoxan chemotherapy on 08/13/2022. --Given extensive side effects so far and great clinical response, we have discussed about omitting further chemotherapy, proceed with MRI to assess response and then follow-up with the surgery team.  Have sent in basket messages to the surgery team as well as engage the nurse navigators.  I think it is best that she proceeds with surgery and then we can discuss adjuvant recommendations.  #Fatigue: --Likely secondary to chemotherapy. --I expect this to improve significantly since she is now going to be done with chemotherapy.  # Arthralgias questionable if this is related to immunotherapy mediated joint pains She responded very well to steroids and these have been tapered off about 2 to 3 weeks ago.  She now has recurrence of the joint pain and swelling.  I will put her back on prednisone 10 mg.  She is also requesting a small prescription for the hydrocodone to manage the pain.  I have sent another in basket message to rheumatology.  Previously when we referred her to rheumatology she was not ready for it since she was getting better with the steroids.  I recommended that she keep the appointment with rheumatology if scheduled to see what the long-term plan would be. She is agreeable.  #Nausea/Vomiting --Zyprexa has been working well for her.  Okay to continue this.  #Constipation --Advised to take stool softeners such as Senna-S 1-2 tablets as needed for constipation  #Oral  mucositis/thrush: --Sent prescription of Magic Mouthwash.    # Bilateral lower extremity swelling, will send for vascular ultrasound, low probability of DVT.  No orders of the defined types were placed in this encounter.   All questions were answered. The patient knows to call the clinic with any problems,  questions or concerns.  I have spent a total of 30 minutes minutes of face-to-face and non-face-to-face time, preparing to see the patient, performing a medically appropriate examination, counseling and educating the patient, ordering medications/tests/procedures, documenting clinical information in the electronic health record, and care coordination. Phone: [phone]

## 2022-08-28 ENCOUNTER — Encounter: Payer: Self-pay | Admitting: Hematology and Oncology

## 2022-08-29 ENCOUNTER — Telehealth: Payer: Self-pay | Admitting: *Deleted

## 2022-08-29 ENCOUNTER — Ambulatory Visit (HOSPITAL_COMMUNITY)
Admission: RE | Admit: 2022-08-29 | Discharge: 2022-08-29 | Disposition: A | Discharge status: Home / Self Care | Payer: BC Managed Care – PPO | Source: Ambulatory Visit | Attending: Hematology and Oncology | Admitting: Hematology and Oncology

## 2022-08-29 ENCOUNTER — Encounter: Payer: Self-pay | Admitting: Hematology and Oncology

## 2022-08-29 ENCOUNTER — Other Ambulatory Visit (HOSPITAL_COMMUNITY): Payer: Self-pay

## 2022-08-29 DIAGNOSIS — Z170 Estrogen receptor positive status [ER+]: Secondary | ICD-10-CM | POA: Insufficient documentation

## 2022-08-29 DIAGNOSIS — C50211 Malignant neoplasm of upper-inner quadrant of right female breast: Secondary | ICD-10-CM | POA: Insufficient documentation

## 2022-08-29 NOTE — Telephone Encounter (Signed)
This RN spoke with pt post her VM-  Vanessa Murphy states [facility] and was told the next available for the MRI is on August the 7th.  She was concerned about waiting that long and is willing to go some where else if in her network.  This RN informed her above will be followed up as need is more urgent and will call her next week.  Vanessa Murphy verbalized understanding.  This note will be sent to the navigators for follow up.

## 2022-09-01 ENCOUNTER — Encounter: Payer: Self-pay | Admitting: Hematology and Oncology

## 2022-09-01 ENCOUNTER — Other Ambulatory Visit (HOSPITAL_COMMUNITY): Payer: Self-pay

## 2022-09-02 ENCOUNTER — Other Ambulatory Visit: Payer: Self-pay | Admitting: *Deleted

## 2022-09-02 ENCOUNTER — Encounter: Payer: Self-pay | Admitting: Hematology and Oncology

## 2022-09-02 DIAGNOSIS — C50211 Malignant neoplasm of upper-inner quadrant of right female breast: Secondary | ICD-10-CM

## 2022-09-04 ENCOUNTER — Other Ambulatory Visit (HOSPITAL_COMMUNITY): Payer: Self-pay

## 2022-09-04 ENCOUNTER — Ambulatory Visit: Payer: BC Managed Care – PPO

## 2022-09-04 ENCOUNTER — Other Ambulatory Visit: Payer: Self-pay

## 2022-09-04 ENCOUNTER — Inpatient Hospital Stay (HOSPITAL_BASED_OUTPATIENT_CLINIC_OR_DEPARTMENT_OTHER): Payer: BC Managed Care – PPO | Admitting: Hematology and Oncology

## 2022-09-04 ENCOUNTER — Encounter: Payer: Self-pay | Admitting: Hematology and Oncology

## 2022-09-04 ENCOUNTER — Telehealth: Payer: Self-pay | Admitting: *Deleted

## 2022-09-04 ENCOUNTER — Inpatient Hospital Stay: Payer: BC Managed Care – PPO

## 2022-09-04 ENCOUNTER — Encounter: Payer: Self-pay | Admitting: *Deleted

## 2022-09-04 VITALS — BP 124/94 | HR 72 | Temp 98.4°F | Resp 18 | Ht 64.0 in | Wt 145.4 lb

## 2022-09-04 DIAGNOSIS — C50211 Malignant neoplasm of upper-inner quadrant of right female breast: Secondary | ICD-10-CM

## 2022-09-04 DIAGNOSIS — Z170 Estrogen receptor positive status [ER+]: Secondary | ICD-10-CM | POA: Diagnosis not present

## 2022-09-04 DIAGNOSIS — Z95828 Presence of other vascular implants and grafts: Secondary | ICD-10-CM

## 2022-09-04 DIAGNOSIS — R110 Nausea: Secondary | ICD-10-CM

## 2022-09-04 LAB — CBC WITH DIFFERENTIAL (CANCER CENTER ONLY)
Abs Immature Granulocytes: 0.13 10*3/uL — ABNORMAL HIGH (ref 0.00–0.07)
Basophils Absolute: 0.1 10*3/uL (ref 0.0–0.1)
Basophils Relative: 1 %
Eosinophils Absolute: 0 10*3/uL (ref 0.0–0.5)
Eosinophils Relative: 0 %
HCT: 33.9 % — ABNORMAL LOW (ref 36.0–46.0)
Hemoglobin: 11.3 g/dL — ABNORMAL LOW (ref 12.0–15.0)
Immature Granulocytes: 2 %
Lymphocytes Relative: 17 %
Lymphs Abs: 1.3 10*3/uL (ref 0.7–4.0)
MCH: 32.4 pg (ref 26.0–34.0)
MCHC: 33.3 g/dL (ref 30.0–36.0)
MCV: 97.1 fL (ref 80.0–100.0)
Monocytes Absolute: 1.1 10*3/uL — ABNORMAL HIGH (ref 0.1–1.0)
Monocytes Relative: 14 %
Neutro Abs: 5.2 10*3/uL (ref 1.7–7.7)
Neutrophils Relative %: 66 %
Platelet Count: 322 10*3/uL (ref 150–400)
RBC: 3.49 MIL/uL — ABNORMAL LOW (ref 3.87–5.11)
RDW: 16.1 % — ABNORMAL HIGH (ref 11.5–15.5)
WBC Count: 7.7 10*3/uL (ref 4.0–10.5)
nRBC: 0 % (ref 0.0–0.2)

## 2022-09-04 LAB — CMP (CANCER CENTER ONLY)
ALT: 27 U/L (ref 0–44)
AST: 23 U/L (ref 15–41)
Albumin: 4 g/dL (ref 3.5–5.0)
Alkaline Phosphatase: 58 U/L (ref 38–126)
Anion gap: 7 (ref 5–15)
BUN: 7 mg/dL (ref 6–20)
CO2: 27 mmol/L (ref 22–32)
Calcium: 9.7 mg/dL (ref 8.9–10.3)
Chloride: 105 mmol/L (ref 98–111)
Creatinine: 0.58 mg/dL (ref 0.44–1.00)
GFR, Estimated: >60 mL/min (ref >60)
Glucose, Bld: 92 mg/dL (ref 70–99)
Potassium: 4.1 mmol/L (ref 3.5–5.1)
Sodium: 139 mmol/L (ref 135–145)
Total Bilirubin: 0.3 mg/dL (ref 0.3–1.2)
Total Protein: 5.9 g/dL — ABNORMAL LOW (ref 6.5–8.1)

## 2022-09-04 MED ORDER — HEPARIN SOD (PORK) LOCK FLUSH 100 UNIT/ML IV SOLN
500.0000 [IU] | Freq: Once | INTRAVENOUS | Status: AC
  Administered 2022-09-04: 500 [IU]

## 2022-09-04 MED ORDER — SODIUM CHLORIDE 0.9% FLUSH
10.0000 mL | Freq: Once | INTRAVENOUS | Status: AC
  Administered 2022-09-04: 10 mL

## 2022-09-04 MED ORDER — PREDNISONE 10 MG PO TABS: 10.0000 mg | ORAL_TABLET | Freq: Every day | ORAL | 0 refills | Status: DC

## 2022-09-04 NOTE — Progress Notes (Signed)
[facility] [phone]   [phone]  PROGRESS NOTE  Patient Care Team: Elie Confer, NP as PCP - Almyra Deforest, MD as Consulting Physician (Hematology and Oncology) Dorothy Puffer, MD as Consulting Physician (Radiation Oncology) Griselda Miner, MD as Consulting Physician (General Surgery) Pershing Proud, RN as Oncology Nurse Navigator Donnelly Angelica, RN as Oncology Nurse Navigator Causey, Larna Daughters, NP as Nurse Practitioner (Hematology and Oncology) Rachel Moulds, MD as Consulting Physician (Hematology and Oncology) Rachel Moulds, MD as Consulting Physician (Hematology and Oncology)  CHIEF COMPLAINTS:  Right breast cancer-s/p Cycle 5 of adriamycin/cytoxan chemotherapy on 08/13/2022.   INTERVAL HISTORY:  Vanessa Murphy is here for a follow-up today.  Since her last week visit, she definitely feels better, her ankles are less swollen and less painful.  She was on prednisone 10 mg a day but forgot to take her dose yesterday.  Besides the improvement in ankle swelling, she feels like her abdomen is less bloated.  There is more energy although she is still fatigued.  She said she was confused about the rheumatology referral hence she refused it.  She is willing to make an appointment as recommended.  She then will be doing her MRI in first week of August and is eager to see Dr. Carolynne Edouard about upcoming surgery.  Nausea is very well-controlled.  Hot flashes are a bit outrageous but she is very reluctant to try Effexor since she was not feeling well last week, she is going to try it this week.  Rest of the pertinent 10 point ROS reviewed and negative  MEDICAL HISTORY:  Past Medical History:  Diagnosis Date   Cancer (HCC)    Migraines     SURGICAL HISTORY: Past Surgical History:  Procedure Laterality Date   BREAST BIOPSY Right 04/25/2022   [place] RT BREAST BX W LOC DEV 1ST LESION IMG BX SPEC US GUIDE 04/25/2022 [facility]   IR  IMAGING GUIDED PORT INSERTION  05/21/2022    SOCIAL HISTORY: Social History   Socioeconomic History   Marital status: Single    Spouse name: Not on file   Number of children: Not on file   Years of education: Not on file   Highest education level: Not on file  Occupational History   Not on file  Tobacco Use   Smoking status: Former    Current packs/day: 0.00    Types: Cigarettes    Quit date: 04/29/2022    Years since quitting: 0.3   Smokeless tobacco: Never  Vaping Use   Vaping status: Never Used  Substance and Sexual Activity   Alcohol use: Yes   Drug use: Never   Sexual activity: Yes    Birth control/protection: Surgical    Comment: MRI 05-16-22 Left Breast Lump found  Other Topics Concern   Not on file  Social History Narrative   Not on file   Social Determinants of Health   Financial Resource Strain: Medium Risk (05/07/2022)   Overall Financial Resource Strain (CARDIA)    Difficulty of Paying Living Expenses: Somewhat hard  Food Insecurity: No Food Insecurity (05/07/2022)   Hunger Vital Sign    Worried About Running Out of Food in the Last Year: Never true    Ran Out of Food in the Last Year: Never true  Transportation Needs: No Transportation Needs (05/07/2022)   PRAPARE - Administrator, Civil Service (Medical): No    Lack of Transportation (Non-Medical): No  Physical Activity: Not  on file  Stress: Stress Concern Present (05/07/2022)   [employer] of Occupational Health - Occupational Stress Questionnaire    Feeling of Stress : Very much  Social Connections: Not on file  Intimate Partner Violence: Low Risk  (05/24/2019)   Received from [facility]   Intimate Partner Violence    Insults You: Not on file    Threatens You: Not on file    Screams at [place]: Not on file    Physically Hurt: Not on file    Intimate Partner Violence Score: Not on file    FAMILY HISTORY: Family History  Problem Relation Age of Onset   Melanoma  Father 79 - 31       dx. again in his early 69s   Breast cancer Paternal Grandmother 59 - 22    ALLERGIES:  is allergic to contrast media [iodinated contrast media], venlafaxine, amitriptyline, iodine, methocarbamol, and tartrazine.  MEDICATIONS:  Current Outpatient Medications  Medication Sig Dispense Refill   ciprofloxacin (CILOXAN) 0.3 % ophthalmic solution Place 2 drops into both eyes every 4 (four) hours while awake. Administer 1 drop, every 2 hours, while awake, for 2 days. Then 1 drop, every 4 hours, while awake, for the next 5 days. 5 mL 0   fluticasone (FLONASE) 50 MCG/ACT nasal spray Place 2 sprays into both nostrils daily. OTC from [employer]     HYDROcodone-acetaminophen (NORCO/VICODIN) 5-325 MG tablet Take 1 tablet by mouth every 8 (eight) hours as needed for moderate pain. 15 tablet 0   lidocaine (XYLOCAINE) 2 % solution Use as directed 15 mLs in the mouth or throat as needed for mouth pain. 100 mL 0   lidocaine-prilocaine (EMLA) cream Apply to affected area once 30 g 3   magic mouthwash (multi-ingredient) oral suspension Swish and swallow 5ml  4 times daily as needed for throat discomfort 400 mL 1   OLANZapine (ZYPREXA) 5 MG tablet Take 1 tablet (5 mg total) by mouth at bedtime. 30 tablet 0   ondansetron (ZOFRAN) 8 MG tablet Take 1 tablet (8 mg total) by mouth every 8 (eight) hours as needed for nausea or vomiting. Start on the third day after chemotherapy. (Patient not taking: Reported on 06/12/2022) 30 tablet 1   predniSONE (DELTASONE) 10 MG tablet Take 1 tablet (10 mg total) by mouth daily with breakfast. 20 tablet 0   prochlorperazine (COMPAZINE) 10 MG tablet Take 1 tablet (10 mg total) by mouth every 6 (six) hours as needed for nausea or vomiting. (Patient not taking: Reported on 06/12/2022) 30 tablet 1   tiZANidine (ZANAFLEX) 2 MG tablet TAKE 1 TABLET(2 MG) BY MOUTH AT BEDTIME AS NEEDED FOR MUSCLE SPASMS 20 tablet 0   venlafaxine XR (EFFEXOR-XR) 37.5 MG 24 hr capsule Take 1 capsule  (37.5 mg total) by mouth daily with breakfast. 30 capsule 1   zolpidem (AMBIEN) 5 MG tablet Take 1 tablet (5 mg total) by mouth at bedtime as needed for sleep. 6 tablet 0   No current facility-administered medications for this visit.    PHYSICAL EXAMINATION: ECOG PERFORMANCE STATUS: 1 - Symptomatic but completely ambulatory  Vitals:   09/04/22 0904  BP: (!) 124/94  Pulse: 72  Resp: 18  Temp: 98.4 F (36.9 C)  SpO2: 100%   Filed Weights   09/04/22 0904  Weight: 145 lb 6.4 oz (66 kg)    SKIN: skin color, texture, turgor are normal, no rashes or significant lesions Bilateral lower extremity swelling, significantly improved.  LABORATORY DATA:  I  have reviewed the data as listed    Latest Ref Rng & Units 09/04/2022    8:29 AM 08/19/2022   10:36 AM 08/13/2022   11:34 AM  CBC  WBC 4.0 - 10.5 K/uL 7.7  7.0  8.4   Hemoglobin 12.0 - 15.0 g/dL 52.8  41.3  24.4   Hematocrit 36.0 - 46.0 % 33.9  32.6  33.5   Platelets 150 - 400 K/uL 322  246  334        Latest Ref Rng & Units 08/19/2022   10:36 AM 08/13/2022   11:34 AM 07/24/2022    9:19 AM  CMP  Glucose 70 - 99 mg/dL 010  76  91   BUN 6 - 20 mg/dL 12  10  10    Creatinine 0.44 - 1.00 mg/dL 2.72  5.36  6.44   Sodium 135 - 145 mmol/L 137  137  139   Potassium 3.5 - 5.1 mmol/L 3.6  3.8  3.7   Chloride 98 - 111 mmol/L 104  103  105   CO2 22 - 32 mmol/L 26  27  26    Calcium 8.9 - 10.3 mg/dL 9.5  9.6  9.6   Total Protein 6.5 - 8.1 g/dL 6.7  6.0  6.6   Total Bilirubin 0.3 - 1.2 mg/dL 0.5  0.2  0.4   Alkaline Phos 38 - 126 U/L 56  52  51   AST 15 - 41 U/L 12  17  23    ALT 0 - 44 U/L 16  23  40     ASSESSMENT & PLAN Vanessa Murphy is a 42 y.o. female who presents to the clinic for symptom management visit.   #Right breast cancer: --Under the care of Dr. Al Pimple --S/P Cycle 5, Day 1 of Adriamycin and cytoxan chemotherapy on 08/13/2022. --Given extensive side effects so far and great clinical response, we have discussed about omitting  further chemotherapy, proceed with MRI to assess response and then follow-up with the surgery team. She is scheduled for MRI 8/7.  I have sent an in basket message to the nurse navigators to coordinate the visit with Dr. Carolynne Edouard.  #Fatigue: --Likely secondary to chemotherapy. --I expect this to improve significantly since she is now going to be done with chemotherapy.  # Arthralgias questionable if this is related to immunotherapy mediated joint pains Getting better overall. She is now on prednisone 10 mg, will taper it over the next 2 weeks. I gave her the phone number to Dr. Fatima Sanger office and dermatology and asked her to schedule an appointment.  Prednisone taper instructions have been given  #Nausea/Vomiting --Since we are not going to continue chemo, she will only use them as needed.  #Constipation --Advised to take stool softeners such as Senna-S 1-2 tablets as needed for constipation  # Bilateral lower extremity swelling, no DVT  No orders of the defined types were placed in this encounter.   All questions were answered. The patient knows to call the clinic with any problems, questions or concerns.  I have spent a total of 30 minutes minutes of face-to-face and non-face-to-face time, preparing to see the patient, performing a medically appropriate examination, counseling and educating the patient, ordering medications/tests/procedures, documenting clinical information in the electronic health record, and care coordination. Phone: [phone]

## 2022-09-04 NOTE — Telephone Encounter (Signed)
New breast MRI date received for 7/28 at 9:30am. Pt confirmed new appt date and time. Physician team informed of new appt. Request sent to present pt post neoadj chemo for treatment response.

## 2022-09-05 ENCOUNTER — Other Ambulatory Visit: Payer: Self-pay

## 2022-09-07 ENCOUNTER — Inpatient Hospital Stay: Admission: RE | Admit: 2022-09-07 | Payer: BC Managed Care – PPO | Source: Ambulatory Visit

## 2022-09-08 ENCOUNTER — Encounter: Payer: Self-pay | Admitting: *Deleted
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
## 2022-09-09 ENCOUNTER — Encounter: Payer: Self-pay | Admitting: Physician Assistant

## 2022-09-09 ENCOUNTER — Encounter: Payer: Self-pay | Admitting: *Deleted

## 2022-09-09 ENCOUNTER — Telehealth: Payer: Self-pay | Admitting: *Deleted

## 2022-09-09 NOTE — Telephone Encounter (Signed)
This RN received VM yesterday from pt stating she missed the MRI of her breast scheduled for 7/28 - " I got my days wrong as I really did not think they did scans on a Sunday" Pt was very apologetic.  This RN communicated with Navigator per above who was aware of no show and stated they are trying now to get it rescheduled.  Today pt has not heard from anyone - this RN sent her an email stating above as well as this RN also sent a email to Janice Norrie at the [facility] per need for urgent rescheduling.

## 2022-09-10 ENCOUNTER — Other Ambulatory Visit: Payer: Self-pay | Admitting: *Deleted

## 2022-09-10 MED ORDER — VEOZAH 45 MG PO TABS: 1.0000 | ORAL_TABLET | Freq: Every day | ORAL | 3 refills | Status: DC

## 2022-09-10 NOTE — Telephone Encounter (Signed)
Pt has ongoing hot flashes with attempted use of gabapentin and venlafaxine with noted side effects interfering with ADL's and not beneficial.  Due to pt is ER/PR + breast cancer prescription for [facility] sent to pt's pharmacy.  Communication sent to pt per above including likelihood that it will need pre authorization which may delay pick up for the pharmacy.

## 2022-09-11 ENCOUNTER — Encounter: Payer: Self-pay | Admitting: Hematology and Oncology

## 2022-09-11 ENCOUNTER — Telehealth: Payer: Self-pay | Admitting: *Deleted

## 2022-09-11 HISTORY — PX: BREAST LUMPECTOMY: SHX2

## 2022-09-11 NOTE — Telephone Encounter (Signed)
This RN spoke with pt per her My Chart message stating the discomfort is "when I cough or move a certain way "  Pain in noted under her arm on the ribcage "in the pectoralis muscle".  She feels the area under her arm may be a little swollen.  She stated concern for port- with pt stating area of port not sore or tender to touch, area is not discolored and there is no swelling.  Per discussion pt has taken some advil for comfort. Discussed continued use as well as cold compresses.  She states she will be able to proceed with the MRI scheduled in the am.  She did state she has not heard from the surgeons office yet " I guess they are waiting on the results ?"  No further needs at this time- pt understands to call if symptoms worsen.  Of note the prescription for Allyne Gee is approved by her insurance- she will pick it later today (out of stock).

## 2022-09-12 ENCOUNTER — Ambulatory Visit
Admission: RE | Admit: 2022-09-12 | Discharge: 2022-09-12 | Disposition: A | Discharge status: Home / Self Care | Payer: BC Managed Care – PPO | Source: Ambulatory Visit | Attending: Hematology and Oncology | Admitting: Hematology and Oncology

## 2022-09-12 DIAGNOSIS — Z170 Estrogen receptor positive status [ER+]: Secondary | ICD-10-CM

## 2022-09-12 MED ORDER — GADOPICLENOL 0.5 MMOL/ML IV SOLN
6.0000 mL | Freq: Once | INTRAVENOUS | Status: AC | PRN
  Administered 2022-09-12: 6 mL via INTRAVENOUS

## 2022-09-15 ENCOUNTER — Encounter: Payer: Self-pay | Admitting: *Deleted

## 2022-09-16 ENCOUNTER — Encounter: Payer: Self-pay | Admitting: Rheumatology

## 2022-09-16 ENCOUNTER — Ambulatory Visit: Payer: BC Managed Care – PPO | Attending: Rheumatology | Admitting: Rheumatology

## 2022-09-16 ENCOUNTER — Ambulatory Visit (INDEPENDENT_AMBULATORY_CARE_PROVIDER_SITE_OTHER): Payer: BC Managed Care – PPO

## 2022-09-16 VITALS — BP 115/83 | HR 74 | Resp 15 | Ht 63.0 in | Wt 145.0 lb

## 2022-09-16 DIAGNOSIS — Z8781 Personal history of (healed) traumatic fracture: Secondary | ICD-10-CM

## 2022-09-16 DIAGNOSIS — Z95828 Presence of other vascular implants and grafts: Secondary | ICD-10-CM

## 2022-09-16 DIAGNOSIS — Z803 Family history of malignant neoplasm of breast: Secondary | ICD-10-CM

## 2022-09-16 DIAGNOSIS — M5030 Other cervical disc degeneration, unspecified cervical region: Secondary | ICD-10-CM

## 2022-09-16 DIAGNOSIS — M25572 Pain in left ankle and joints of left foot: Secondary | ICD-10-CM

## 2022-09-16 DIAGNOSIS — E559 Vitamin D deficiency, unspecified: Secondary | ICD-10-CM

## 2022-09-16 DIAGNOSIS — M25571 Pain in right ankle and joints of right foot: Secondary | ICD-10-CM

## 2022-09-16 DIAGNOSIS — M25552 Pain in left hip: Secondary | ICD-10-CM

## 2022-09-16 DIAGNOSIS — M25562 Pain in left knee: Secondary | ICD-10-CM

## 2022-09-16 DIAGNOSIS — M1990 Unspecified osteoarthritis, unspecified site: Secondary | ICD-10-CM | POA: Diagnosis not present

## 2022-09-16 DIAGNOSIS — G8929 Other chronic pain: Secondary | ICD-10-CM

## 2022-09-16 DIAGNOSIS — F419 Anxiety disorder, unspecified: Secondary | ICD-10-CM

## 2022-09-16 DIAGNOSIS — M25561 Pain in right knee: Secondary | ICD-10-CM

## 2022-09-16 DIAGNOSIS — Z87891 Personal history of nicotine dependence: Secondary | ICD-10-CM

## 2022-09-16 DIAGNOSIS — Z808 Family history of malignant neoplasm of other organs or systems: Secondary | ICD-10-CM

## 2022-09-16 DIAGNOSIS — C50211 Malignant neoplasm of upper-inner quadrant of right female breast: Secondary | ICD-10-CM

## 2022-09-16 DIAGNOSIS — Z8669 Personal history of other diseases of the nervous system and sense organs: Secondary | ICD-10-CM

## 2022-09-16 DIAGNOSIS — Z170 Estrogen receptor positive status [ER+]: Secondary | ICD-10-CM

## 2022-09-16 DIAGNOSIS — F4323 Adjustment disorder with mixed anxiety and depressed mood: Secondary | ICD-10-CM

## 2022-09-16 DIAGNOSIS — R5383 Other fatigue: Secondary | ICD-10-CM

## 2022-09-16 LAB — SEDIMENTATION RATE: Sed Rate: 22 mm/h — ABNORMAL HIGH (ref 0–20)

## 2022-09-17 ENCOUNTER — Other Ambulatory Visit: Payer: Self-pay | Admitting: Physician Assistant

## 2022-09-17 ENCOUNTER — Other Ambulatory Visit: Payer: BC Managed Care – PPO

## 2022-09-17 ENCOUNTER — Other Ambulatory Visit: Payer: Self-pay | Admitting: *Deleted

## 2022-09-17 DIAGNOSIS — E559 Vitamin D deficiency, unspecified: Secondary | ICD-10-CM

## 2022-09-17 MED ORDER — VITAMIN D (ERGOCALCIFEROL) 1.25 MG (50000 UNIT) PO CAPS: 50000.0000 [IU] | ORAL_CAPSULE | ORAL | 0 refills | Status: DC

## 2022-09-17 NOTE — Progress Notes (Signed)
Vitamin D is low.  Please call in vitamin D 50,000 units once a week for 3 months.  Recheck vitamin D in 3 months.

## 2022-09-17 NOTE — Telephone Encounter (Signed)
-----   Message from [facility] sent at 09/17/2022  8:05 AM EDT ----- Vitamin D is low.  Please call in vitamin D 50,000 units once a week for 3 months.  Recheck vitamin D in 3 months.

## 2022-09-22 NOTE — Telephone Encounter (Signed)
According to CoverMyMeds, the drug is covered under her current plan and does not require a PA. I notified Patient

## 2022-09-23 ENCOUNTER — Ambulatory Visit: Payer: Self-pay | Admitting: General Surgery

## 2022-09-23 DIAGNOSIS — Z170 Estrogen receptor positive status [ER+]: Secondary | ICD-10-CM

## 2022-09-23 NOTE — Progress Notes (Signed)
All the labs are within normal limits except for mildly elevated sedimentation rate and vitamin D.  Will discuss results at the follow-up visit.

## 2022-09-24 ENCOUNTER — Other Ambulatory Visit: Payer: Self-pay | Admitting: General Surgery

## 2022-09-24 ENCOUNTER — Other Ambulatory Visit: Payer: Self-pay

## 2022-09-24 DIAGNOSIS — C50211 Malignant neoplasm of upper-inner quadrant of right female breast: Secondary | ICD-10-CM

## 2022-09-25 ENCOUNTER — Inpatient Hospital Stay: Payer: BC Managed Care – PPO

## 2022-09-25 ENCOUNTER — Encounter: Payer: Self-pay | Admitting: Rheumatology

## 2022-09-25 ENCOUNTER — Other Ambulatory Visit: Payer: Self-pay

## 2022-09-25 ENCOUNTER — Inpatient Hospital Stay: Payer: BC Managed Care – PPO | Attending: Hematology and Oncology | Admitting: Adult Health

## 2022-09-25 ENCOUNTER — Ambulatory Visit: Payer: BC Managed Care – PPO

## 2022-09-25 ENCOUNTER — Encounter: Payer: Self-pay | Admitting: Adult Health

## 2022-09-25 ENCOUNTER — Encounter (HOSPITAL_BASED_OUTPATIENT_CLINIC_OR_DEPARTMENT_OTHER): Payer: Self-pay | Admitting: General Surgery

## 2022-09-25 VITALS — BP 96/63 | HR 88 | Temp 98.7°F | Resp 18 | Ht 63.0 in | Wt 143.6 lb

## 2022-09-25 DIAGNOSIS — C50211 Malignant neoplasm of upper-inner quadrant of right female breast: Secondary | ICD-10-CM

## 2022-09-25 DIAGNOSIS — Z87891 Personal history of nicotine dependence: Secondary | ICD-10-CM | POA: Diagnosis not present

## 2022-09-25 DIAGNOSIS — Z170 Estrogen receptor positive status [ER+]: Secondary | ICD-10-CM

## 2022-09-25 DIAGNOSIS — Z803 Family history of malignant neoplasm of breast: Secondary | ICD-10-CM | POA: Insufficient documentation

## 2022-09-25 DIAGNOSIS — Z95828 Presence of other vascular implants and grafts: Secondary | ICD-10-CM

## 2022-09-25 DIAGNOSIS — R110 Nausea: Secondary | ICD-10-CM

## 2022-09-25 DIAGNOSIS — R21 Rash and other nonspecific skin eruption: Secondary | ICD-10-CM | POA: Diagnosis not present

## 2022-09-25 DIAGNOSIS — R5383 Other fatigue: Secondary | ICD-10-CM | POA: Diagnosis not present

## 2022-09-25 DIAGNOSIS — Z808 Family history of malignant neoplasm of other organs or systems: Secondary | ICD-10-CM | POA: Diagnosis not present

## 2022-09-25 LAB — CBC WITH DIFFERENTIAL (CANCER CENTER ONLY)
Abs Immature Granulocytes: 0.02 10*3/uL (ref 0.00–0.07)
Basophils Absolute: 0 10*3/uL (ref 0.0–0.1)
Basophils Relative: 0 %
Eosinophils Absolute: 0.4 10*3/uL (ref 0.0–0.5)
Eosinophils Relative: 5 %
HCT: 34.7 % — ABNORMAL LOW (ref 36.0–46.0)
Hemoglobin: 11.9 g/dL — ABNORMAL LOW (ref 12.0–15.0)
Immature Granulocytes: 0 %
Lymphocytes Relative: 21 %
Lymphs Abs: 1.6 10*3/uL (ref 0.7–4.0)
MCH: 32.3 pg (ref 26.0–34.0)
MCHC: 34.3 g/dL (ref 30.0–36.0)
MCV: 94.3 fL (ref 80.0–100.0)
Monocytes Absolute: 0.7 10*3/uL (ref 0.1–1.0)
Monocytes Relative: 10 %
Neutro Abs: 4.8 10*3/uL (ref 1.7–7.7)
Neutrophils Relative %: 64 %
Platelet Count: 236 10*3/uL (ref 150–400)
RBC: 3.68 MIL/uL — ABNORMAL LOW (ref 3.87–5.11)
RDW: 14.1 % (ref 11.5–15.5)
WBC Count: 7.6 10*3/uL (ref 4.0–10.5)
nRBC: 0 % (ref 0.0–0.2)

## 2022-09-25 LAB — CMP (CANCER CENTER ONLY)
ALT: 23 U/L (ref 0–44)
AST: 18 U/L (ref 15–41)
Albumin: 4.3 g/dL (ref 3.5–5.0)
Alkaline Phosphatase: 77 U/L (ref 38–126)
Anion gap: 5 (ref 5–15)
BUN: 9 mg/dL (ref 6–20)
CO2: 29 mmol/L (ref 22–32)
Calcium: 9.2 mg/dL (ref 8.9–10.3)
Chloride: 103 mmol/L (ref 98–111)
Creatinine: 0.7 mg/dL (ref 0.44–1.00)
GFR, Estimated: >60 mL/min (ref >60)
Glucose, Bld: 109 mg/dL — ABNORMAL HIGH (ref 70–99)
Potassium: 3.6 mmol/L (ref 3.5–5.1)
Sodium: 137 mmol/L (ref 135–145)
Total Bilirubin: 0.3 mg/dL (ref 0.3–1.2)
Total Protein: 6.6 g/dL (ref 6.5–8.1)

## 2022-09-25 MED ORDER — SODIUM CHLORIDE 0.9% FLUSH
10.0000 mL | Freq: Once | INTRAVENOUS | Status: AC
  Administered 2022-09-25: 10 mL

## 2022-09-25 MED ORDER — HEPARIN SOD (PORK) LOCK FLUSH 100 UNIT/ML IV SOLN
500.0000 [IU] | Freq: Once | INTRAVENOUS | Status: AC
  Administered 2022-09-25: 500 [IU]

## 2022-09-25 NOTE — Assessment & Plan Note (Signed)
Vanessa Murphy is a 42 year old woman with stage IIb right-sided breast cancer here today for follow-up and evaluation prior to receiving neoadjuvant Keytruda, Taxol, carbo.  Stage IIb breast cancer: She has completed her neoadjuvant chemotherapy.  I reviewed her MRI images with her in detail today and compared the MRI from April to the MRI in August.  She had a very good response to the neoadjuvant chemotherapy and was very tearful and joyful about how well it worked. Fatigue: She is managing this with energy conservation. Rash: She is using a lotion with a steroid in it along with taking Benadryl.  Alert and we want to avoid doing steroids since she is having surgery soon.  Destiny will return in 3 weeks for labs and follow-up with Dr. Al Pimple.  Her neck step is surgery which will occur on August 23.

## 2022-09-25 NOTE — Progress Notes (Signed)
[facility] Cancer Follow up:    Vanessa Moulds, MD [address] [place] [address]   DIAGNOSIS:  Cancer Staging  Malignant neoplasm of upper-inner quadrant of right breast in female, estrogen receptor positive (HCC) Staging form: Breast, AJCC 8th Edition - Clinical stage from 05/07/2022: Stage IIB (cT2, cN0, cM0, G3, ER+, PR-, HER2-) - Unsigned Stage prefix: Initial diagnosis Histologic grading system: 3 grade system Laterality: Right Staged by: Pathologist and managing physician Stage used in treatment planning: Yes National guidelines used in treatment planning: Yes Type of national guideline used in treatment planning: NCCN   SUMMARY OF ONCOLOGIC HISTORY: Oncology History  Malignant 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   04/25/2022 Pathology Results   Right breast needle core biopsy at 3:00 showed high-grade invasive ductal carcinoma.  Prognostic showed ER 40% positive weak staining intensity, PR 0% negative, group 5 HER2 negative and Ki-67 of 80%.   05/22/2022 - 08/13/2022 Chemotherapy   Patient is on Treatment Plan : BREAST Pembrolizumab (200) D1 + Carboplatin (1.5) D1,8,15 + Paclitaxel (80) D1,8,15 q21d X 4 cycles / Pembrolizumab (200) D1 + AC D1 q21d x 4 cycles      Genetic Testing   [facility] Custom Cancer Panel+RNA was Negative. Report date is 05/14/2022.  The Custom Hereditary Cancers Panel offered by [facility] includes sequencing and/or deletion duplication testing of the following 43 genes: APC, ATM, AXIN2, BAP1, BARD1, BMPR1A, BRCA1, BRCA2, BRIP1, CDH1, CDK4, CDKN2A (p14ARF and p16INK4a only), CHEK2, CTNNA1, EPCAM (Deletion/duplication testing only), FH, GREM1 (promoter region duplication testing only), HOXB13, KIT, MBD4, MEN1, MLH1,  MSH2, MSH3, MSH6, MUTYH, NF1, NHTL1, PALB2, PDGFRA, PMS2, POLD1, POLE, PTEN, RAD51C, RAD51D, SMAD4, SMARCA4. STK11, TP53, TSC1, TSC2, and VHL.     CURRENT THERAPY: preparing for surgery  INTERVAL HISTORY: Vanessa Murphy 42 y.o. female returns for follow-up of her right breast cancer.  She is scheduled for surgery on October 03, 2022.  Vanessa Murphy underwent a bilateral breast MRI on September 13, 2022 that demonstrated an interval response to treatment with near complete resolution of her right breast mass.  Vanessa Murphy is continue to struggle with fatigue and intermittent skin rash that she is applying in 1% lotion on and taking Benadryl for.  Other than struggling with the sequela of chemotherapy she is scheduled for her surgery and is ready to continue to heal from her treatment.   Patient Active Problem List   Diagnosis Date Noted   Nausea without vomiting 08/21/2022   Port-A-Cath in place 05/22/2022   Genetic testing 05/15/2022   Family history of breast cancer 05/07/2022   Family history of melanoma 05/07/2022   Malignant neoplasm of upper-inner quadrant of right breast in female, estrogen receptor positive (HCC) 05/06/2022   Adjustment disorder with mixed anxiety and depressed mood 03/18/2019   Anxiety 07/12/2018   Abnormal menstruation 07/23/2006   Intractable migraine 08/05/2004   DDD (degenerative disc disease), cervical 08/05/2004    is allergic to contrast media [iodinated contrast media], venlafaxine, amitriptyline, iodine, methocarbamol, and tartrazine.  MEDICAL HISTORY: Past Medical History:  Diagnosis Date   Anxiety    Cancer (HCC)    Migraines     SURGICAL HISTORY: Past Surgical History:  Procedure Laterality Date   BILATERAL SALPINGECTOMY     BREAST BIOPSY Right 04/25/2022   [place] RT  BREAST BX W LOC DEV 1ST LESION IMG BX SPEC US GUIDE 04/25/2022 [facility]MO[facility]   BREAST BIOPSY Left 05/2022   IR IMAGING GUIDED PORT INSERTION  05/21/2022    SOCIAL  HISTORY: Social History   Socioeconomic History   Marital status: Single    Spouse name: Not on file   Number of children: Not on file   Years of education: Not on file   Highest education level: Not on file  Occupational History   Not on file  Tobacco Use   Smoking status: Former    Current packs/day: 0.00    Average packs/day: 0.5 packs/day for 24.2 years (12.1 ttl pk-yrs)    Types: Cigarettes    Start date: 2000    Quit date: 05/07/2022    Years since quitting: 0.3    Passive exposure: Past   Smokeless tobacco: Never   Tobacco comments:    Quit 05/07/2022  Vaping Use   Vaping status: Never Used  Substance and Sexual Activity   Alcohol use: Not Currently   Drug use: Never   Sexual activity: Yes    Birth control/protection: Surgical    Comment: MRI 05-16-22 Left Breast Lump found  Other Topics Concern   Not on file  Social History Narrative   Not on file   Social Determinants of Health   Financial Resource Strain: Medium Risk (05/07/2022)   Overall Financial Resource Strain (CARDIA)    Difficulty of Paying Living Expenses: Somewhat hard  Food Insecurity: No Food Insecurity (05/07/2022)   Hunger Vital Sign    Worried About Running Out of Food in the Last Year: Never true    Ran Out of Food in the Last Year: Never true  Transportation Needs: No Transportation Needs (05/07/2022)   PRAPARE - Administrator, Civil Service (Medical): No    Lack of Transportation (Non-Medical): No  Physical Activity: Not on file  Stress: Stress Concern Present (05/07/2022)   [employer] of Occupational Health - Occupational Stress Questionnaire    Feeling of Stress : Very much  Social Connections: Not on file  Intimate Partner Violence: Low Risk  (05/24/2019)   Received from [facility]   Intimate Partner Violence    Insults You: Not on file    Threatens You: Not on file    Screams at [place]: Not on file    Physically Hurt: Not on file    Intimate Partner  Violence Score: Not on file    FAMILY HISTORY: Family History  Problem Relation Age of Onset   Migraines Mother    Melanoma Father 87 - 74       dx. again in his early 68s   Breast cancer Paternal Grandmother 65 - 37    Review of Systems  Constitutional:  Negative for appetite change, chills, fatigue, fever and unexpected weight change.  HENT:   Negative for hearing loss, lump/mass and trouble swallowing.   Eyes:  Negative for eye problems and icterus.  Respiratory:  Negative for chest tightness, cough and shortness of breath.   Cardiovascular:  Negative for chest pain, leg swelling and palpitations.  Gastrointestinal:  Negative for abdominal distention, abdominal pain, constipation, diarrhea, nausea and vomiting.  Endocrine: Negative for hot flashes.  Genitourinary:  Negative for difficulty urinating.   Musculoskeletal:  Negative for arthralgias.  Skin:  Negative for itching and rash.  Neurological:  Negative for dizziness, extremity weakness, headaches and numbness.  Hematological:  Negative for adenopathy. Does not bruise/bleed easily.  Psychiatric/Behavioral:  Negative for depression. The patient is not nervous/anxious.       PHYSICAL EXAMINATION   Onc Performance Status - 09/25/22 1213       ECOG Perf Status   ECOG Perf Status Restricted in physically strenuous activity but ambulatory and able to carry out work of a light or sedentary nature, e.g., light house work, office work      KPS SCALE   KPS % SCORE Able to carry on normal activity, minor s/s of disease             Vitals:   09/25/22 1201  BP: 96/63  Pulse: 88  Resp: 18  Temp: 98.7 F (37.1 C)  SpO2: 100%    Physical Exam Constitutional:      General: She is not in acute distress.    Appearance: Normal appearance. She is not toxic-appearing.  HENT:     Head: Normocephalic and atraumatic.     Mouth/Throat:     Mouth: Mucous membranes are moist.     Pharynx: Oropharynx is clear. No  oropharyngeal exudate or posterior oropharyngeal erythema.  Eyes:     General: No scleral icterus. Cardiovascular:     Rate and Rhythm: Normal rate and regular rhythm.     Pulses: Normal pulses.     Heart sounds: Normal heart sounds.  Pulmonary:     Effort: Pulmonary effort is normal.     Breath sounds: Normal breath sounds.  Abdominal:     General: Abdomen is flat. Bowel sounds are normal. There is no distension.     Palpations: Abdomen is soft.     Tenderness: There is no abdominal tenderness.  Musculoskeletal:        General: No swelling.     Cervical back: Neck supple.  Lymphadenopathy:     Cervical: No cervical adenopathy.  Skin:    General: Skin is warm and dry.     Findings: No rash.  Neurological:     General: No focal deficit present.     Mental Status: She is alert.  Psychiatric:        Mood and Affect: Mood normal.        Behavior: Behavior normal.     LABORATORY DATA:  CBC    Component Value Date/Time   WBC 7.6 09/25/2022 1130   WBC 6.8 06/24/2022 1352   RBC 3.68 (L) 09/25/2022 1130   HGB 11.9 (L) 09/25/2022 1130   HCT 34.7 (L) 09/25/2022 1130   PLT 236 09/25/2022 1130   MCV 94.3 09/25/2022 1130   MCH 32.3 09/25/2022 1130   MCHC 34.3 09/25/2022 1130   RDW 14.1 09/25/2022 1130   LYMPHSABS 1.6 09/25/2022 1130   MONOABS 0.7 09/25/2022 1130   EOSABS 0.4 09/25/2022 1130   BASOSABS 0.0 09/25/2022 1130    CMP     Component Value Date/Time   NA 137 09/25/2022 1130   K 3.6 09/25/2022 1130   CL 103 09/25/2022 1130   CO2 29 09/25/2022 1130   GLUCOSE 109 (H) 09/25/2022 1130   BUN 9 09/25/2022 1130   CREATININE 0.70 09/25/2022 1130   CALCIUM 9.2 09/25/2022 1130   PROT 6.6 09/25/2022 1130   ALBUMIN 4.3 09/25/2022 1130   AST 18 09/25/2022 1130   ALT 23 09/25/2022 1130   ALKPHOS 77 09/25/2022 1130   BILITOT 0.3 09/25/2022 1130   GFRNONAA >60 09/25/2022 1130       PENDING LABS:   RADIOGRAPHIC STUDIES:  No results  found.   PATHOLOGY:  ASSESSMENT and THERAPY PLAN:   Malignant neoplasm of upper-inner quadrant of right breast in female, estrogen receptor positive (HCC) Vanessa Murphy is a 42 year old woman with stage IIb right-sided breast cancer here today for follow-up and evaluation prior to receiving neoadjuvant Keytruda, Taxol, carbo.  Stage IIb breast cancer: She has completed her neoadjuvant chemotherapy.  I reviewed her MRI images with her in detail today and compared the MRI from April to the MRI in August.  She had a very good response to the neoadjuvant chemotherapy and was very tearful and joyful about how well it worked. Fatigue: She is managing this with energy conservation. Rash: She is using a lotion with a steroid in it along with taking Benadryl.  Alert and we want to avoid doing steroids since she is having surgery soon.  Vanessa Murphy will return in 3 weeks for labs and follow-up with Dr. Al Pimple.  Her neck step is surgery which will occur on August 23.    All questions were answered. The patient knows to call the clinic with any problems, questions or concerns. We can certainly see the patient much sooner if necessary.  Total encounter time:30 minutes*in face-to-face visit time, chart review, lab review, care coordination, order entry, and documentation of the encounter time.    Lillard Anes, NP 09/25/22 3:18 PM Medical Oncology and Hematology [facility] [address] [place], [place] [address] Tel. [phone]    Fax. [phone]  *Total Encounter Time as defined by the Centers for Medicare and Medicaid Services includes, in addition to the face-to-face time of a patient visit (documented in the note above) non-face-to-face time: obtaining and reviewing outside history, ordering and reviewing medications, tests or procedures, care coordination (communications with other health care professionals or caregivers) and documentation in the medical record.

## 2022-09-29 ENCOUNTER — Encounter: Payer: Self-pay | Admitting: Hematology and Oncology

## 2022-09-29 NOTE — Telephone Encounter (Signed)
All the labs were negative except mildly elevated sedimentation rate which is not significant.  Patient has seen Dr. Susa Simmonds in the past and had MRI of her left ankle.  She had significant edema on her left lower extremity at the last visit.  It would be nice to see her for a follow-up visit when she is off prednisone.  She appears to be in a lot of discomfort and pain.  Would you consider sending her to pain management?

## 2022-09-30 ENCOUNTER — Other Ambulatory Visit: Payer: Self-pay

## 2022-09-30 ENCOUNTER — Telehealth: Payer: Self-pay | Admitting: *Deleted

## 2022-09-30 ENCOUNTER — Ambulatory Visit (HOSPITAL_COMMUNITY)
Admission: RE | Admit: 2022-09-30 | Discharge: 2022-09-30 | Disposition: A | Discharge status: Home / Self Care | Payer: BC Managed Care – PPO | Source: Ambulatory Visit | Attending: Hematology and Oncology | Admitting: Hematology and Oncology

## 2022-09-30 ENCOUNTER — Telehealth: Payer: Self-pay

## 2022-09-30 ENCOUNTER — Telehealth: Payer: Self-pay | Admitting: Rheumatology

## 2022-09-30 ENCOUNTER — Encounter: Payer: Self-pay | Admitting: Hematology and Oncology

## 2022-09-30 ENCOUNTER — Other Ambulatory Visit: Payer: Self-pay | Admitting: *Deleted

## 2022-09-30 DIAGNOSIS — M7989 Other specified soft tissue disorders: Secondary | ICD-10-CM

## 2022-09-30 DIAGNOSIS — Z170 Estrogen receptor positive status [ER+]: Secondary | ICD-10-CM

## 2022-09-30 DIAGNOSIS — R52 Pain, unspecified: Secondary | ICD-10-CM

## 2022-09-30 DIAGNOSIS — C50211 Malignant neoplasm of upper-inner quadrant of right female breast: Secondary | ICD-10-CM | POA: Diagnosis present

## 2022-09-30 DIAGNOSIS — M25579 Pain in unspecified ankle and joints of unspecified foot: Secondary | ICD-10-CM

## 2022-09-30 NOTE — Telephone Encounter (Signed)
This RN spoke with pt per result of doppler - and review of communication between her and the rheumatologist.  Issue may be recurrence of ankle issues she has been treated for in the past due to chemo fatigue - with current plan to treat pain with support brace and pain medication until surgery for the breast cancer.  Once she has recovered from the surgery she can see Dr Terrilee Files for evaluation and therapy.  Pt is in agreement to plan. \ Pt feels tramadol has been beneficial for pain with least side effects.  Request for tramadol to be sent to verified pharmacy of [facility] on [address] and [address] will be sent to MD.

## 2022-09-30 NOTE — Telephone Encounter (Signed)
I think she is off prednisone now.  I am uncertain why she has unilateral edema on 1 extremity.  It would be unusual to have unilateral edema related to immunotherapy.  When I saw her she was on prednisone and I could not see any synovitis.  I believe the discomfort in her legs is coming from pedal edema.

## 2022-09-30 NOTE — Telephone Encounter (Signed)
I had a phone conversation with Dr. Al Pimple.  As patient continues to have left lower extremity edema Dr. Al Pimple ordered ultrasound of her left lower extremity to rule out DVT which was negative.  I discussed that the patient did not have any synovitis on the examination at the last visit.  She continues to have left ankle joint discomfort which she has been having for last few years.  She has seen Dr. Susa Simmonds in the past.  She may benefit by seeing Dr. Susa Simmonds again.  She was on prednisone when she came to see me during the last visit.  It would be good to examine her while she is off prednisone.  She has a return appointment coming up in mid September.  Dr. Al Pimple was in agreement.  I also discussed that patient may need pain management due to severe generalized pain. Pollyann Savoy, MD

## 2022-09-30 NOTE — Telephone Encounter (Signed)
Called pt to inform her of doppler study scheduled for 11:30am at [facility]. Instructions provided on where to go in order to go once at the hospital. Pt stated understanding and all questions addressed.

## 2022-09-30 NOTE — Progress Notes (Unsigned)
vas 

## 2022-10-01 ENCOUNTER — Other Ambulatory Visit: Payer: Self-pay | Admitting: Hematology and Oncology

## 2022-10-01 MED ORDER — TRAMADOL HCL 50 MG PO TABS: 50.0000 mg | ORAL_TABLET | Freq: Two times a day (BID) | ORAL | 0 refills | Status: DC | PRN

## 2022-10-02 ENCOUNTER — Ambulatory Visit
Admission: RE | Admit: 2022-10-02 | Discharge: 2022-10-02 | Disposition: A | Discharge status: Home / Self Care | Payer: BC Managed Care – PPO | Source: Ambulatory Visit | Attending: General Surgery | Admitting: General Surgery

## 2022-10-02 ENCOUNTER — Other Ambulatory Visit: Payer: BC Managed Care – PPO

## 2022-10-02 DIAGNOSIS — C50211 Malignant neoplasm of upper-inner quadrant of right female breast: Secondary | ICD-10-CM

## 2022-10-02 HISTORY — PX: BREAST BIOPSY: SHX20

## 2022-10-02 MED ORDER — CHLORHEXIDINE GLUCONATE CLOTH 2 % EX PADS: 6.0000 | MEDICATED_PAD | Freq: Once | CUTANEOUS | Status: DC

## 2022-10-02 NOTE — Anesthesia Preprocedure Evaluation (Addendum)
Anesthesia Evaluation  Patient identified by MRN, date of birth, ID band Patient awake    Reviewed: Allergy & Precautions, NPO status , Patient's Chart, lab work & pertinent test results  History of Anesthesia Complications (+) PONV  Airway Mallampati: II  TM Distance: >3 FB Neck ROM: Full    Dental  (+) Dental Advisory Given   Pulmonary Patient abstained from smoking., former smoker   breath sounds clear to auscultation       Cardiovascular (-) angina  Rhythm:Regular Rate:Normal  05/2022 ECHO: EF 60-65%, normal LVF, normal RVF, bicuspid aortic valve, without AS   Neuro/Psych  Headaches  Anxiety        GI/Hepatic negative GI ROS, Neg liver ROS,,,  Endo/Other  negative endocrine ROS    Renal/GU negative Renal ROS     Musculoskeletal   Abdominal   Peds  Hematology negative hematology ROS (+)   Anesthesia Other Findings Breast cancer  Reproductive/Obstetrics                             Anesthesia Physical Anesthesia Plan  ASA: 3  Anesthesia Plan: General   Post-op Pain Management: Tylenol PO (pre-op)* and Regional block*   Induction: Intravenous  PONV Risk Score and Plan: 3 and Ondansetron, Dexamethasone and Scopolamine patch - Pre-op  Airway Management Planned: LMA  Additional Equipment: None  Intra-op Plan:   Post-operative Plan:   Informed Consent: I have reviewed the patients History and Physical, chart, labs and discussed the procedure including the risks, benefits and alternatives for the proposed anesthesia with the patient or authorized representative who has indicated his/her understanding and acceptance.     Dental advisory given  Plan Discussed with: CRNA and Surgeon  Anesthesia Plan Comments: (GA with pectoralis block for post op analgesia)       Anesthesia Quick Evaluation

## 2022-10-02 NOTE — Progress Notes (Signed)

## 2022-10-03 ENCOUNTER — Other Ambulatory Visit: Payer: Self-pay

## 2022-10-03 ENCOUNTER — Ambulatory Visit (HOSPITAL_BASED_OUTPATIENT_CLINIC_OR_DEPARTMENT_OTHER): Payer: BC Managed Care – PPO | Admitting: Anesthesiology

## 2022-10-03 ENCOUNTER — Encounter (HOSPITAL_BASED_OUTPATIENT_CLINIC_OR_DEPARTMENT_OTHER): Payer: Self-pay | Admitting: General Surgery

## 2022-10-03 ENCOUNTER — Ambulatory Visit
Admission: RE | Admit: 2022-10-03 | Discharge: 2022-10-03 | Disposition: A | Discharge status: Home / Self Care | Payer: BC Managed Care – PPO | Source: Ambulatory Visit | Attending: General Surgery | Admitting: General Surgery

## 2022-10-03 ENCOUNTER — Encounter (HOSPITAL_BASED_OUTPATIENT_CLINIC_OR_DEPARTMENT_OTHER)
Admission: RE | Disposition: A | Discharge status: Home / Self Care | Payer: Self-pay | Source: Home / Self Care | Attending: General Surgery

## 2022-10-03 ENCOUNTER — Ambulatory Visit (HOSPITAL_COMMUNITY)
Admission: RE | Admit: 2022-10-03 | Discharge: 2022-10-03 | Disposition: A | Discharge status: Home / Self Care | Payer: BC Managed Care – PPO | Source: Ambulatory Visit | Attending: General Surgery | Admitting: General Surgery

## 2022-10-03 ENCOUNTER — Ambulatory Visit (HOSPITAL_BASED_OUTPATIENT_CLINIC_OR_DEPARTMENT_OTHER)
Admission: RE | Admit: 2022-10-03 | Discharge: 2022-10-03 | Disposition: A | Discharge status: Home / Self Care | Payer: BC Managed Care – PPO | Attending: General Surgery | Admitting: General Surgery

## 2022-10-03 DIAGNOSIS — Z9221 Personal history of antineoplastic chemotherapy: Secondary | ICD-10-CM | POA: Insufficient documentation

## 2022-10-03 DIAGNOSIS — Z5986 Financial insecurity: Secondary | ICD-10-CM | POA: Diagnosis not present

## 2022-10-03 DIAGNOSIS — N6011 Diffuse cystic mastopathy of right breast: Secondary | ICD-10-CM | POA: Insufficient documentation

## 2022-10-03 DIAGNOSIS — Z01818 Encounter for other preprocedural examination: Secondary | ICD-10-CM

## 2022-10-03 DIAGNOSIS — F17210 Nicotine dependence, cigarettes, uncomplicated: Secondary | ICD-10-CM | POA: Diagnosis not present

## 2022-10-03 DIAGNOSIS — C50211 Malignant neoplasm of upper-inner quadrant of right female breast: Secondary | ICD-10-CM | POA: Insufficient documentation

## 2022-10-03 DIAGNOSIS — Z170 Estrogen receptor positive status [ER+]: Secondary | ICD-10-CM | POA: Diagnosis not present

## 2022-10-03 DIAGNOSIS — C50311 Malignant neoplasm of lower-inner quadrant of right female breast: Secondary | ICD-10-CM | POA: Insufficient documentation

## 2022-10-03 HISTORY — DX: Other specified postprocedural states: Z98.890

## 2022-10-03 HISTORY — PX: BREAST LUMPECTOMY WITH RADIOACTIVE SEED AND SENTINEL LYMPH NODE BIOPSY: SHX6550

## 2022-10-03 HISTORY — DX: Nausea with vomiting, unspecified: R11.2

## 2022-10-03 HISTORY — DX: Anxiety disorder, unspecified: F41.9

## 2022-10-03 LAB — POCT PREGNANCY, URINE: Preg Test, Ur: NEGATIVE

## 2022-10-03 SURGERY — BREAST LUMPECTOMY WITH RADIOACTIVE SEED AND SENTINEL LYMPH NODE BIOPSY
Anesthesia: General | Site: Breast | Laterality: Right

## 2022-10-03 MED ORDER — FENTANYL CITRATE (PF) 100 MCG/2ML IJ SOLN
INTRAMUSCULAR | Status: AC
  Filled 2022-10-03: qty 2

## 2022-10-03 MED ORDER — ONDANSETRON HCL 4 MG/2ML IJ SOLN
INTRAMUSCULAR | Status: AC
  Filled 2022-10-03: qty 2

## 2022-10-03 MED ORDER — MIDAZOLAM HCL 2 MG/2ML IJ SOLN
INTRAMUSCULAR | Status: AC
  Filled 2022-10-03: qty 2

## 2022-10-03 MED ORDER — LIDOCAINE HCL (CARDIAC) PF 100 MG/5ML IV SOSY
PREFILLED_SYRINGE | INTRAVENOUS | Status: DC | PRN
  Administered 2022-10-03: 40 mg via INTRAVENOUS

## 2022-10-03 MED ORDER — PHENYLEPHRINE HCL (PRESSORS) 10 MG/ML IV SOLN
INTRAVENOUS | Status: DC | PRN
  Administered 2022-10-03 (×3): 160 ug via INTRAVENOUS
  Administered 2022-10-03: 80 ug via INTRAVENOUS
  Administered 2022-10-03: 160 ug via INTRAVENOUS

## 2022-10-03 MED ORDER — CEFAZOLIN SODIUM-DEXTROSE 2-3 GM-%(50ML) IV SOLR
INTRAVENOUS | Status: DC | PRN
  Administered 2022-10-03: 2 g via INTRAVENOUS

## 2022-10-03 MED ORDER — BUPIVACAINE-EPINEPHRINE 0.25% -1:200000 IJ SOLN
INTRAMUSCULAR | Status: DC | PRN
  Administered 2022-10-03: 25 mL

## 2022-10-03 MED ORDER — MIDAZOLAM HCL 2 MG/2ML IJ SOLN
1.0000 mg | Freq: Once | INTRAMUSCULAR | Status: AC
  Administered 2022-10-03: 1 mg via INTRAVENOUS

## 2022-10-03 MED ORDER — MEPERIDINE HCL 25 MG/ML IJ SOLN: 6.2500 mg | INTRAMUSCULAR | Status: DC | PRN

## 2022-10-03 MED ORDER — LIDOCAINE 2% (20 MG/ML) 5 ML SYRINGE
INTRAMUSCULAR | Status: AC
  Filled 2022-10-03: qty 5

## 2022-10-03 MED ORDER — BUPIVACAINE-EPINEPHRINE (PF) 0.5% -1:200000 IJ SOLN
INTRAMUSCULAR | Status: DC | PRN
  Administered 2022-10-03: 30 mL

## 2022-10-03 MED ORDER — CEFAZOLIN SODIUM-DEXTROSE 2-4 GM/100ML-% IV SOLN
INTRAVENOUS | Status: AC
  Filled 2022-10-03: qty 100

## 2022-10-03 MED ORDER — OXYCODONE HCL 5 MG PO TABS
5.0000 mg | ORAL_TABLET | Freq: Once | ORAL | Status: AC | PRN
  Administered 2022-10-03: 5 mg via ORAL

## 2022-10-03 MED ORDER — KETOROLAC TROMETHAMINE 30 MG/ML IJ SOLN
INTRAMUSCULAR | Status: AC
  Filled 2022-10-03: qty 1

## 2022-10-03 MED ORDER — ACETAMINOPHEN 500 MG PO TABS
ORAL_TABLET | ORAL | Status: AC
  Filled 2022-10-03: qty 2

## 2022-10-03 MED ORDER — SCOPOLAMINE 1 MG/3DAYS TD PT72
MEDICATED_PATCH | TRANSDERMAL | Status: AC
  Filled 2022-10-03: qty 1

## 2022-10-03 MED ORDER — HYDROMORPHONE HCL 1 MG/ML IJ SOLN
INTRAMUSCULAR | Status: AC
  Filled 2022-10-03: qty 0.5

## 2022-10-03 MED ORDER — PROMETHAZINE HCL 25 MG/ML IJ SOLN
INTRAMUSCULAR | Status: AC
  Filled 2022-10-03: qty 1

## 2022-10-03 MED ORDER — EPHEDRINE SULFATE (PRESSORS) 50 MG/ML IJ SOLN
INTRAMUSCULAR | Status: DC | PRN
  Administered 2022-10-03 (×3): 10 mg via INTRAVENOUS

## 2022-10-03 MED ORDER — KETOROLAC TROMETHAMINE 30 MG/ML IJ SOLN
30.0000 mg | Freq: Once | INTRAMUSCULAR | Status: AC
  Administered 2022-10-03: 30 mg via INTRAVENOUS

## 2022-10-03 MED ORDER — HYDROMORPHONE HCL 1 MG/ML IJ SOLN
0.2500 mg | INTRAMUSCULAR | Status: DC | PRN
  Administered 2022-10-03 (×2): 0.5 mg via INTRAVENOUS

## 2022-10-03 MED ORDER — OXYCODONE HCL 5 MG PO TABS
ORAL_TABLET | ORAL | Status: AC
  Filled 2022-10-03: qty 1

## 2022-10-03 MED ORDER — MIDAZOLAM HCL 2 MG/2ML IJ SOLN: 0.5000 mg | Freq: Once | INTRAMUSCULAR | Status: DC | PRN

## 2022-10-03 MED ORDER — OXYCODONE HCL 5 MG/5ML PO SOLN: 5.0000 mg | Freq: Once | ORAL | Status: AC | PRN

## 2022-10-03 MED ORDER — FENTANYL CITRATE (PF) 100 MCG/2ML IJ SOLN
50.0000 ug | Freq: Once | INTRAMUSCULAR | Status: AC
  Administered 2022-10-03: 50 ug via INTRAVENOUS

## 2022-10-03 MED ORDER — CEFAZOLIN SODIUM-DEXTROSE 2-4 GM/100ML-% IV SOLN: 2.0000 g | INTRAVENOUS | Status: DC

## 2022-10-03 MED ORDER — ACETAMINOPHEN 500 MG PO TABS
1000.0000 mg | ORAL_TABLET | Freq: Once | ORAL | Status: AC
  Administered 2022-10-03: 1000 mg via ORAL

## 2022-10-03 MED ORDER — PROMETHAZINE HCL 25 MG/ML IJ SOLN
6.2500 mg | INTRAMUSCULAR | Status: DC | PRN
  Administered 2022-10-03: 6.25 mg via INTRAVENOUS

## 2022-10-03 MED ORDER — DEXAMETHASONE SODIUM PHOSPHATE 10 MG/ML IJ SOLN
INTRAMUSCULAR | Status: DC | PRN
  Administered 2022-10-03: 5 mg via INTRAVENOUS

## 2022-10-03 MED ORDER — FENTANYL CITRATE (PF) 100 MCG/2ML IJ SOLN
INTRAMUSCULAR | Status: DC | PRN
  Administered 2022-10-03 (×2): 25 ug via INTRAVENOUS
  Administered 2022-10-03: 50 ug via INTRAVENOUS

## 2022-10-03 MED ORDER — FENTANYL CITRATE (PF) 100 MCG/2ML IJ SOLN
100.0000 ug | Freq: Once | INTRAMUSCULAR | Status: AC
  Administered 2022-10-03: 50 ug via INTRAVENOUS

## 2022-10-03 MED ORDER — DEXAMETHASONE SODIUM PHOSPHATE 10 MG/ML IJ SOLN
INTRAMUSCULAR | Status: AC
  Filled 2022-10-03: qty 1

## 2022-10-03 MED ORDER — LACTATED RINGERS IV SOLN: INTRAVENOUS | Status: DC | PRN

## 2022-10-03 MED ORDER — PROPOFOL 10 MG/ML IV BOLUS
INTRAVENOUS | Status: AC
  Filled 2022-10-03: qty 20

## 2022-10-03 MED ORDER — SCOPOLAMINE 1 MG/3DAYS TD PT72
1.0000 | MEDICATED_PATCH | TRANSDERMAL | Status: DC
  Administered 2022-10-03: 1.5 mg via TRANSDERMAL

## 2022-10-03 MED ORDER — MIDAZOLAM HCL 2 MG/2ML IJ SOLN
2.0000 mg | Freq: Once | INTRAMUSCULAR | Status: AC
  Administered 2022-10-03: 1 mg via INTRAVENOUS

## 2022-10-03 MED ORDER — TECHNETIUM TC 99M TILMANOCEPT KIT
1.0000 | PACK | Freq: Once | INTRAVENOUS | Status: AC | PRN
  Administered 2022-10-03: 1 via INTRADERMAL

## 2022-10-03 MED ORDER — LACTATED RINGERS IV SOLN: INTRAVENOUS | Status: DC

## 2022-10-03 MED ORDER — ONDANSETRON HCL 4 MG/2ML IJ SOLN
INTRAMUSCULAR | Status: DC | PRN
  Administered 2022-10-03: 4 mg via INTRAVENOUS

## 2022-10-03 MED ORDER — PROPOFOL 10 MG/ML IV BOLUS
INTRAVENOUS | Status: DC | PRN
Start: 2022-10-03 — End: 2022-10-03
  Administered 2022-10-03: 180 mg via INTRAVENOUS

## 2022-10-03 MED ORDER — OXYCODONE HCL 5 MG PO TABS: 5.0000 mg | ORAL_TABLET | Freq: Four times a day (QID) | ORAL | 0 refills | Status: DC | PRN

## 2022-10-03 SURGICAL SUPPLY — 46 items
ADH SKN CLS APL DERMABOND .7 (GAUZE/BANDAGES/DRESSINGS) ×1
APL PRP STRL LF DISP 70% ISPRP (MISCELLANEOUS) ×1
APPLIER CLIP 9.375 MED OPEN (MISCELLANEOUS) ×1
APR CLP MED 9.3 20 MLT OPN (MISCELLANEOUS) ×1
BINDER BREAST LRG (GAUZE/BANDAGES/DRESSINGS) IMPLANT
BLADE SURG 15 STRL LF DISP TIS (BLADE) ×1 IMPLANT
BLADE SURG 15 STRL SS (BLADE) ×1
CANISTER SUC SOCK COL 7IN (MISCELLANEOUS) IMPLANT
CANISTER SUCT 1200ML W/VALVE (MISCELLANEOUS) IMPLANT
CHLORAPREP W/TINT 26 (MISCELLANEOUS) ×1 IMPLANT
CLIP APPLIE 9.375 MED OPEN (MISCELLANEOUS) ×1 IMPLANT
COVER BACK TABLE 60X90IN (DRAPES) ×1 IMPLANT
COVER MAYO STAND STRL (DRAPES) ×1 IMPLANT
COVER PROBE CYLINDRICAL 5X96 (MISCELLANEOUS) ×1 IMPLANT
DERMABOND ADVANCED .7 DNX12 (GAUZE/BANDAGES/DRESSINGS) ×1 IMPLANT
DRAPE LAPAROSCOPIC ABDOMINAL (DRAPES) ×1 IMPLANT
DRAPE UTILITY XL STRL (DRAPES) ×1 IMPLANT
ELECT COATED BLADE 2.86 ST (ELECTRODE) ×1 IMPLANT
ELECT REM PT RETURN 9FT ADLT (ELECTROSURGICAL) ×1
ELECTRODE REM PT RTRN 9FT ADLT (ELECTROSURGICAL) ×1 IMPLANT
GLOVE BIO SURGEON STRL SZ7 (GLOVE) IMPLANT
GLOVE BIO SURGEON STRL SZ7.5 (GLOVE) ×1 IMPLANT
GLOVE BIOGEL PI IND STRL 7.0 (GLOVE) IMPLANT
GLOVE BIOGEL PI IND STRL 7.5 (GLOVE) IMPLANT
GLOVE SURG SYN 7.5 E (GLOVE) ×1
GLOVE SURG SYN 7.5 PF PI (GLOVE) IMPLANT
GOWN STRL REUS W/ TWL LRG LVL3 (GOWN DISPOSABLE) ×2 IMPLANT
GOWN STRL REUS W/TWL LRG LVL3 (GOWN DISPOSABLE) ×3
KIT MARKER MARGIN INK (KITS) ×1 IMPLANT
NDL HYPO 25X1 1.5 SAFETY (NEEDLE) ×1 IMPLANT
NDL SAFETY ECLIP 18X1.5 (MISCELLANEOUS) IMPLANT
NEEDLE HYPO 25X1 1.5 SAFETY (NEEDLE) ×1
NS IRRIG 1000ML POUR BTL (IV SOLUTION) IMPLANT
PACK BASIN DAY SURGERY FS (CUSTOM PROCEDURE TRAY) ×1 IMPLANT
PENCIL SMOKE EVACUATOR (MISCELLANEOUS) ×1 IMPLANT
SLEEVE SCD COMPRESS KNEE MED (STOCKING) ×1 IMPLANT
SPIKE FLUID TRANSFER (MISCELLANEOUS) IMPLANT
SPONGE T-LAP 18X18 ~~LOC~~+RFID (SPONGE) ×1 IMPLANT
SUT MON AB 4-0 PC3 18 (SUTURE) ×2 IMPLANT
SUT SILK 2 0 SH (SUTURE) IMPLANT
SUT VICRYL 3-0 CR8 SH (SUTURE) ×1 IMPLANT
SYR CONTROL 10ML LL (SYRINGE) ×1 IMPLANT
TOWEL GREEN STERILE FF (TOWEL DISPOSABLE) ×1 IMPLANT
TRAY FAXITRON CT DISP (TRAY / TRAY PROCEDURE) ×1 IMPLANT
TUBE CONNECTING 20X1/4 (TUBING) IMPLANT
YANKAUER SUCT BULB TIP NO VENT (SUCTIONS) IMPLANT

## 2022-10-03 NOTE — Anesthesia Procedure Notes (Signed)
Procedure Name: LMA Insertion Date/Time: 10/03/2022 9:38 AM  Performed by: Karen Kitchens, CRNAPre-anesthesia Checklist: Patient identified, Emergency Drugs available, Suction available and Patient being monitored Patient Re-evaluated:Patient Re-evaluated prior to induction Oxygen Delivery Method: Circle system utilized Preoxygenation: Pre-oxygenation with 100% oxygen Induction Type: IV induction Ventilation: Mask ventilation without difficulty LMA: LMA inserted LMA Size: 4.0 Number of attempts: 1 Airway Equipment and Method: Bite block Placement Confirmation: positive ETCO2, breath sounds checked- equal and bilateral and CO2 detector Tube secured with: Tape Dental Injury: Teeth and Oropharynx as per pre-operative assessment

## 2022-10-03 NOTE — Progress Notes (Signed)
Nuclear medicine at bedside for right breast injections.  Pt tolerated procedure well, VSS.

## 2022-10-03 NOTE — H&P (Signed)
PROVIDER: Lindell Noe, MD  MRN: [id number] DOB: 09-12-1980 Subjective   Chief Complaint: Breast Cancer   History of Present Illness: Vanessa Murphy is a 42 y.o. female who is seen today for right breast cancer. The patient is a 42 year old white female who was diagnosed with a 2.2 cm cancer in the lower inner quadrant of the right breast back in March. The cancer was weakly ER positive and PR negative and HER2 negative with a Ki-67 of 80%. Her lymph nodes looked normal at that time. She underwent neoadjuvant chemotherapy and has had a complete radiographic response. Her last dose of chemo was in early July. She is now ready to schedule her definitive surgery.    Review of Systems: A complete review of systems was obtained from the patient. I have reviewed this information and discussed as appropriate with the patient. See HPI as well for other ROS.  ROS   Medical History: Past Medical History:  Diagnosis Date  Anemia  Anxiety  Arrhythmia   Patient Active Problem List  Diagnosis  Malignant neoplasm of lower-inner quadrant of right breast of female, estrogen receptor positive (CMS/HHS-HCC)   Past Surgical History:  Procedure Laterality Date  LAPAROSCOPIC TUBAL LIGATION  2017    Allergies  Allergen Reactions  Amitriptyline Other (See Comments)  Iodinated Contrast Media Vomiting  Methocarbamol Itching, Other (See Comments) and Unknown  HIVES   Current Outpatient Medications on File Prior to Visit  Medication Sig Dispense Refill  ergocalciferol, vitamin D2, 1,250 mcg (50,000 unit) capsule Take 50,000 Units by mouth every 7 (seven) days  lidocaine-prilocaine (EMLA) cream Apply to affected area once  OLANZapine (ZYPREXA) 5 MG tablet Take 5 mg by mouth at bedtime  ondansetron (ZOFRAN) 8 MG tablet Take by mouth  predniSONE (DELTASONE) 5 MG tablet  prochlorperazine (COMPAZINE) 10 MG tablet Take 10 mg by mouth every 6 (six) hours as needed  tiZANidine (ZANAFLEX) 2 MG  tablet TAKE 1 TABLET(2 MG) BY MOUTH AT BEDTIME AS NEEDED FOR MUSCLE SPASMS  VEOZAH 45 mg Tab Take by mouth  zolpidem (AMBIEN) 5 MG tablet Take 5 mg by mouth at bedtime as needed   No current facility-administered medications on file prior to visit.   Family History  Problem Relation Age of Onset  Skin cancer Father    Social History   Tobacco Use  Smoking Status Some Days  Types: Cigarettes  Smokeless Tobacco Not on file    Social History   Socioeconomic History  Marital status: Single  Tobacco Use  Smoking status: Some Days  Types: Cigarettes  Substance and Sexual Activity  Alcohol use: Yes  Drug use: Never   Social Determinants of Health   Financial Resource Strain: Medium Risk (05/07/2022)  Received from [facility]  Overall Financial Resource Strain (CARDIA)  Difficulty of Paying Living Expenses: Somewhat hard  Food Insecurity: No Food Insecurity (05/07/2022)  Received from [facility]  Hunger Vital Sign  Worried About Running Out of Food in the Last Year: Never true  Ran Out of Food in the Last Year: Never true  Transportation Needs: No Transportation Needs (05/07/2022)  Received from [facility] - Transportation  Lack of Transportation (Medical): No  Lack of Transportation (Non-Medical): No  Stress: Stress Concern Present (05/07/2022)  Received from [facility] of Occupational Health - Occupational Stress Questionnaire  Feeling of Stress : Very much   Objective:   Vitals:  PainSc: 0-No pain   There is no height or weight on  file to calculate BMI.  Physical Exam Vitals reviewed.  Constitutional:  General: She is not in acute distress. Appearance: Normal appearance.  HENT:  Head: Normocephalic and atraumatic.  Right Ear: External ear normal.  Left Ear: External ear normal.  Nose: Nose normal.  Mouth/Throat:  Mouth: Mucous membranes are moist.  Pharynx: Oropharynx is clear.  Eyes:  General: No scleral  icterus. Extraocular Movements: Extraocular movements intact.  Conjunctiva/sclera: Conjunctivae normal.  Pupils: Pupils are equal, round, and reactive to light.  Cardiovascular:  Rate and Rhythm: Normal rate and regular rhythm.  Pulses: Normal pulses.  Heart sounds: Normal heart sounds.  Pulmonary:  Effort: Pulmonary effort is normal. No respiratory distress.  Breath sounds: Normal breath sounds.  Abdominal:  General: Bowel sounds are normal.  Palpations: Abdomen is soft.  Tenderness: There is no abdominal tenderness.  Musculoskeletal:  General: No swelling, tenderness or deformity. Normal range of motion.  Cervical back: Normal range of motion and neck supple.  Skin: General: Skin is warm and dry.  Coloration: Skin is not jaundiced.  Neurological:  General: No focal deficit present.  Mental Status: She is alert and oriented to person, place, and time.  Psychiatric:  Mood and Affect: Mood normal.  Behavior: Behavior normal.     Breast: There is no palpable mass in either breast. There is no palpable axillary, supraclavicular, or cervical lymphadenopathy.  Labs, Imaging and Diagnostic Testing:  Assessment and Plan:   Diagnoses and all orders for this visit:  Malignant neoplasm of lower-inner quadrant of right breast of female, estrogen receptor positive (CMS/HHS-HCC) - [facility] Case Posting Request; Future    The patient had a 2.2 cm cancer in the lower inner quadrant of the right breast with clinically negative nodes. She has finished neoadjuvant chemotherapy and is now ready to schedule her definitive surgery. I have talked to her in detail about the different options and at this point she favors breast conservation which I feel is very reasonable. She is also a good candidate for sentinel node biopsy. I have discussed with her in detail the risks and benefits of the operation as well as some of the technical aspects including use of a radioactive seed for localization and she  understands and wishes to proceed.

## 2022-10-03 NOTE — Anesthesia Postprocedure Evaluation (Signed)
Anesthesia Post Note  Patient: Vanessa Murphy  Procedure(s) Performed: RIGHT BREAST LUMPECTOMY WITH RADIOACTIVE SEED AND SENTINEL LYMPH NODE BIOPSY (Right: Breast)     Patient location during evaluation: PACU Anesthesia Type: General Level of consciousness: awake and alert, patient cooperative and oriented Pain management: pain level controlled Vital Signs Assessment: post-procedure vital signs reviewed and stable Respiratory status: spontaneous breathing, nonlabored ventilation and respiratory function stable Cardiovascular status: blood pressure returned to baseline and stable Postop Assessment: no apparent nausea or vomiting, able to ambulate and adequate PO intake Anesthetic complications: no   No notable events documented.  Last Vitals:  Vitals:   10/03/22 1130 10/03/22 1145  BP: 131/80 120/81  Pulse:  73  Resp:  16  Temp:  (!) 36.3 C  SpO2: 99% 99%    Last Pain:  Vitals:   10/03/22 1145  TempSrc:   PainSc: 5                  Karlynn Furrow,E. Quantarius Genrich

## 2022-10-03 NOTE — Transfer of Care (Signed)
Immediate Anesthesia Transfer of Care Note  Patient: Vanessa Murphy  Procedure(s) Performed: RIGHT BREAST LUMPECTOMY WITH RADIOACTIVE SEED AND SENTINEL LYMPH NODE BIOPSY (Right: Breast)  Patient Location: PACU  Anesthesia Type:General  Level of Consciousness: awake and alert   Airway & Oxygen Therapy: Patient Spontanous Breathing and Patient connected to face mask oxygen  Post-op Assessment: Report given to RN and Post -op Vital signs reviewed and stable  Post vital signs: Reviewed and stable  Last Vitals:  Vitals Value Taken Time  BP    Temp    Pulse 58 10/03/22 1043  Resp 17 10/03/22 1043  SpO2 100 % 10/03/22 1043  Vitals shown include unfiled device data.  Last Pain:  Vitals:   10/03/22 0710  TempSrc: Oral  PainSc: 0-No pain      Patients Stated Pain Goal: 4 (10/03/22 0710)  Complications: No notable events documented.

## 2022-10-03 NOTE — Progress Notes (Signed)
Assisted Dr. Annye Asa with right, pectoralis, ultrasound guided block. Side rails up, monitors on throughout procedure. See vital signs in flow sheet. Tolerated Procedure well. ?

## 2022-10-03 NOTE — Op Note (Signed)
10/03/2022  10:37 AM  PATIENT:  Vanessa Murphy  42 y.o. female  PRE-OPERATIVE DIAGNOSIS:  RIGHT BREAST CANCER  POST-OPERATIVE DIAGNOSIS:  RIGHT BREAST CANCER  PROCEDURE:  Procedure(s) with comments: RIGHT BREAST LUMPECTOMY WITH RADIOACTIVE SEED LOCALIZATION AND DEEP RIGHT AXILLARY SENTINEL LYMPH NODE BIOPSY (Right) - PEC BLOCK  SURGEON:  Surgeons and Role:    * Griselda Miner, MD - Primary  PHYSICIAN ASSISTANT:   ASSISTANTS: none   ANESTHESIA:   local and general  EBL:  50 mL   BLOOD ADMINISTERED:none  DRAINS: none   LOCAL MEDICATIONS USED:  MARCAINE     SPECIMEN:  Source of Specimen:  right breast tissue with additional medial margin and sentinel node  DISPOSITION OF SPECIMEN:  PATHOLOGY  COUNTS:  YES  TOURNIQUET:  * No tourniquets in log *  DICTATION: .Dragon Dictation  After informed consent was obtained the patient was brought to the operating room and placed in the supine position on the operating table.  After adequate induction of general anesthesia the patient's right chest, breast, and axillary area were prepped with ChloraPrep, allowed to dry, and draped in usual sterile manner.  An appropriate timeout was performed.  Previously an I-125 seed was placed in the inner aspect of the right breast to mark an area of invasive breast cancer.  Also earlier in the day the patient underwent injection of 1 mCi of technetium sulfur colloid in the subareolar position on the right.  Attention was first turned to the right axilla.  The neoprobe was set to technetium and an area of radioactivity was readily identified.  The area overlying this was infiltrated with quarter percent Marcaine.  A small transversely oriented incision was made with a 15 blade knife.  The incision was carried through the skin and subcutaneous tissue sharply with the electrocautery until the deep right axillary space was entered.  The neoprobe was used to direct blunt hemostat dissection and I was able  to identify an area of increased radioactivity.  This area was excised sharply with the electrocautery and the surrounding small vessels and lymphatics were controlled with clips.  This tissue had what looked like for lymph nodes that all had signal in on.  Ex vivo counts ranged from [phone].  No other hot or palpable nodes were identified in the right axilla.  This was sent as sentinel node #1.  The deep portion of the incision was closed with interrupted 3-0 Vicryl stitches.  The skin was closed with a running 4-0 Monocryl subcuticular stitch.  Attention was then turned to the right breast.  The neoprobe was set to I-125 in the area of radioactivity was readily identified in the inner aspect of the right breast.  The area around this was infiltrated with quarter percent Marcaine.  A curvilinear incision was then made with a 15 blade knife along the inner edge of the areola of the right breast.  The incision was carried through the skin and subcutaneous tissue sharply with the electrocautery.  Dissection was then carried medially between the breast tissue and the subcutaneous fat and skin.  Once this dissection was well beyond the area of the cancer I then removed a circular portion of breast tissue sharply with the electrocautery around the radioactive seed while checking the area of radioactivity frequently.  This dissection was carried all the way to the muscle of the chest wall.  Once the tissue was removed it was oriented with the appropriate paint colors.  A specimen radiograph was  obtained that showed the clip and seed to be near the center of the specimen.  I did elect to take an additional medial margin and this was marked appropriately.  All of the tissue was then sent to pathology for further evaluation.  Hemostasis was achieved using the Bovie electrocautery.  The wound was irrigated with saline and infiltrated with more quarter percent Marcaine.  The cavity was marked with clips.  The deep layer of  the incision was then closed with layers of interrupted 3-0 Vicryl stitches.  The skin was then closed with interrupted 4-0 Monocryl subcuticular stitches.  Dermabond dressings were applied.  The patient tolerated the procedure well.  At the end of the case all needle sponge and instrument counts were correct.  The patient was then awakened and taken to recovery in stable condition.  PLAN OF CARE: Discharge to home after PACU  PATIENT DISPOSITION:  PACU - hemodynamically stable.   Delay start of Pharmacological VTE agent (>24hrs) due to surgical blood loss or risk of bleeding: not applicable

## 2022-10-03 NOTE — Discharge Instructions (Signed)
No tylenol until 1:15 p.m.  Post Anesthesia Home Care Instructions  Activity: Get plenty of rest for the remainder of the day. A responsible individual must stay with you for 24 hours following the procedure.  For the next 24 hours, DO NOT: -Drive a car -Advertising copywriter -Drink alcoholic beverages -Take any medication unless instructed by your physician -Make any legal decisions or sign important papers.  Meals: Start with liquid foods such as gelatin or soup. Progress to regular foods as tolerated. Avoid greasy, spicy, heavy foods. If nausea and/or vomiting occur, drink only clear liquids until the nausea and/or vomiting subsides. Call your physician if vomiting continues.  Special Instructions/Symptoms: Your throat may feel dry or sore from the anesthesia or the breathing tube placed in your throat during surgery. If this causes discomfort, gargle with warm salt water. The discomfort should disappear within 24 hours.  If you had a scopolamine patch placed behind your ear for the management of post- operative nausea and/or vomiting:  1. The medication in the patch is effective for 72 hours, after which it should be removed.  Wrap patch in a tissue and discard in the trash. Wash hands thoroughly with soap and water. 2. You may remove the patch earlier than 72 hours if you experience unpleasant side effects which may include dry mouth, dizziness or visual disturbances. 3. Avoid touching the patch. Wash your hands with soap and water after contact with the patch.

## 2022-10-03 NOTE — Interval H&P Note (Signed)
History and Physical Interval Note:  10/03/2022 8:53 AM  Vanessa Murphy  has presented today for surgery, with the diagnosis of RIGHT BREAST CANCER.  The various methods of treatment have been discussed with the patient and family. After consideration of risks, benefits and other options for treatment, the patient has consented to  Procedure(s) with comments: RIGHT BREAST LUMPECTOMY WITH RADIOACTIVE SEED AND SENTINEL LYMPH NODE BIOPSY (Right) - PEC BLOCK as a surgical intervention.  The patient's history has been reviewed, patient examined, no change in status, stable for surgery.  I have reviewed the patient's chart and labs.  Questions were answered to the patient's satisfaction.     Chevis Pretty III

## 2022-10-03 NOTE — Anesthesia Procedure Notes (Signed)
Anesthesia Regional Block: Pectoralis block   Pre-Anesthetic Checklist: , timeout performed,  Correct Patient, Correct Site, Correct Laterality,  Correct Procedure, Correct Position, site marked,  Risks and benefits discussed,  Surgical consent,  Pre-op evaluation,  At surgeon's request and post-op pain management  Laterality: Right  Prep: chloraprep       Needles:  Injection technique: Single-shot  Needle Type: Echogenic Needle     Needle Length: 9cm  Needle Gauge: 21     Additional Needles:   Procedures:,,,, ultrasound used (permanent image in chart),,    Narrative:  Start time: 10/03/2022 8:13 AM End time: 10/03/2022 8:19 AM Injection made incrementally with aspirations every 5 mL.  Performed by: Personally  Anesthesiologist: Jairo Ben, MD  Additional Notes: Pt identified in Holding room.  Monitors applied. Working IV access confirmed. Timeout, Sterile prep R clavicle and pec.Marland Kitchen  #21ga ECHOgenic Arrow block needle between pec minor and serratus, ribs 3,4 with US guidance.  20cc 0.5% Bupivacaine 1:200k epi injected incrementally after negative test dose, additional 10cc between pec major and pec minor.  Patient asymptomatic, VSS, no heme aspirated, tolerated well.   Sandford Craze, MD

## 2022-10-06 ENCOUNTER — Encounter: Payer: Self-pay | Admitting: Hematology and Oncology

## 2022-10-06 ENCOUNTER — Encounter (HOSPITAL_BASED_OUTPATIENT_CLINIC_OR_DEPARTMENT_OTHER): Payer: Self-pay | Admitting: General Surgery

## 2022-10-07 ENCOUNTER — Encounter: Payer: Self-pay | Admitting: Family Medicine

## 2022-10-07 ENCOUNTER — Encounter: Payer: Self-pay | Admitting: *Deleted

## 2022-10-07 ENCOUNTER — Telehealth: Payer: Self-pay | Admitting: Radiation Oncology

## 2022-10-07 DIAGNOSIS — Z170 Estrogen receptor positive status [ER+]: Secondary | ICD-10-CM

## 2022-10-07 NOTE — Telephone Encounter (Signed)
Called patient to schedule a consultation w. Bryson Ha. No answer, LVM for a return call.

## 2022-10-08 LAB — SURGICAL PATHOLOGY

## 2022-10-14 ENCOUNTER — Other Ambulatory Visit: Payer: Self-pay | Admitting: Hematology and Oncology

## 2022-10-14 ENCOUNTER — Other Ambulatory Visit: Payer: Self-pay | Admitting: Physician Assistant

## 2022-10-14 MED ORDER — TRAMADOL HCL 50 MG PO TABS: 50.0000 mg | ORAL_TABLET | Freq: Two times a day (BID) | ORAL | 0 refills | Status: DC | PRN

## 2022-10-14 NOTE — Progress Notes (Signed)
Patient now has sports medicine appt on Sep 19 and has been using tramadol BID for pain control We have given her refill for approximately 15 days again today At this time, we will not be able to give any more narcotic prescriptions and we hope ortho may be able to help manage this since she had a previous injury and we have also consulted rheum ( she has a FU with rheum) for assistance incase she has any arthralgias from our treatment,but from my discussion so far, there is no evidence of it.  Harrison Zetina

## 2022-10-15 ENCOUNTER — Other Ambulatory Visit: Payer: Self-pay

## 2022-10-15 ENCOUNTER — Encounter: Payer: Self-pay | Admitting: *Deleted

## 2022-10-15 ENCOUNTER — Other Ambulatory Visit: Payer: Self-pay | Admitting: Family Medicine

## 2022-10-15 DIAGNOSIS — M25572 Pain in left ankle and joints of left foot: Secondary | ICD-10-CM

## 2022-10-15 NOTE — Telephone Encounter (Signed)
Spoke with patient. She understands that she can only receive ankle MRI at this time

## 2022-10-15 NOTE — Telephone Encounter (Signed)
I see that we ordered two different MRI's, I was somehow able to get the ankle approved without even seeing the patient, but the TIB/FIB is a not go. They will not approve as there needs to be an office visit with a physical exam, 6 weeks of provider directed treatment, follow up with failure to get better, and also we have no Xray images of TIB/FIB. If patient wants to get this done it would be out of pocket, or if she has been seeing another provider for this issue she may be able to get them to order it.

## 2022-10-21 NOTE — Progress Notes (Deleted)
Office Visit Note  Patient: Vanessa Murphy             Date of Birth: 05-16-1980           MRN: [id number]             PCP: Rachel Moulds, MD Referring: Rachel Moulds, MD Visit Date: 10/29/2022 Occupation: @GUAROCC @  Subjective:  No chief complaint on file.   History of Present Illness: Vanessa Murphy is a 42 y.o. female ***     Activities of Daily Living:  Patient reports morning stiffness for *** {minute/hour:19697}.   Patient {ACTIONS;DENIES/REPORTS:21021675::"Denies"} nocturnal pain.  Difficulty dressing/grooming: {ACTIONS;DENIES/REPORTS:21021675::"Denies"} Difficulty climbing stairs: {ACTIONS;DENIES/REPORTS:21021675::"Denies"} Difficulty getting out of chair: {ACTIONS;DENIES/REPORTS:21021675::"Denies"} Difficulty using hands for taps, buttons, cutlery, and/or writing: {ACTIONS;DENIES/REPORTS:21021675::"Denies"}  No Rheumatology ROS completed.   PMFS History:  Patient Active Problem List   Diagnosis Date Noted   Nausea without vomiting 08/21/2022   Port-A-Cath in place 05/22/2022   Genetic testing 05/15/2022   Family history of breast cancer 05/07/2022   Family history of melanoma 05/07/2022   Malignant neoplasm of upper-inner quadrant of right breast in female, estrogen receptor positive (HCC) 05/06/2022   Adjustment disorder with mixed anxiety and depressed mood 03/18/2019   Anxiety 07/12/2018   Abnormal menstruation 07/23/2006   Intractable migraine 08/05/2004   DDD (degenerative disc disease), cervical 08/05/2004    Past Medical History:  Diagnosis Date   Anxiety    Cancer (HCC)    Migraines    PONV (postoperative nausea and vomiting)     Family History  Problem Relation Age of Onset   Migraines Mother    Melanoma Father 5 - 80       dx. again in his early 68s   Breast cancer Paternal Grandmother 36 - 68   Past Surgical History:  Procedure Laterality Date   BILATERAL SALPINGECTOMY     BREAST BIOPSY Right 04/25/2022   [place] RT BREAST BX W  LOC DEV 1ST LESION IMG BX SPEC US GUIDE 04/25/2022 [facility]   BREAST BIOPSY Left 05/2022   BREAST BIOPSY  10/02/2022   MM RT RADIOACTIVE SEED LOC MAMMO GUIDE 10/02/2022 [facility]   BREAST LUMPECTOMY WITH RADIOACTIVE SEED AND SENTINEL LYMPH NODE BIOPSY Right 10/03/2022   Procedure: RIGHT BREAST LUMPECTOMY WITH RADIOACTIVE SEED AND SENTINEL LYMPH NODE BIOPSY;  Surgeon: Griselda Miner, MD;  Location: [facility];  Service: General;  Laterality: Right;  PEC BLOCK   IR IMAGING GUIDED PORT INSERTION  05/21/2022   Social History   Social History Narrative   Not on file   Immunization History  Administered Date(s) Administered   DTP 08/10/1979, 08/23/1979, 09/22/1979, 11/23/1979, 05/04/1981   Hepatitis B, PED/ADOLESCENT 09/17/2005, 11/17/2005, 04/23/2006   Influenza Inj Mdck Quad Pf 11/20/2016   Influenza Nasal 12/05/2013, 03/20/2014, 12/10/2015   Influenza Split 01/09/2005, 11/25/2010, 11/09/2014, 11/20/2016   Influenza, Quadrivalent, Recombinant, Inj, Pf 11/15/2019   Influenza, Seasonal, Injecte, Preservative Fre 03/25/2008   Influenza,Quad,Nasal, Live 03/20/2014   Influenza,inj,Quad PF,6+ Mos 12/05/2013, 12/10/2015, 12/07/2017, 11/30/2018   Influenza-Unspecified 11/04/2012, 11/30/2018   MMR 08/21/1980, 07/29/1991   Moderna Sars-Covid-2 Vaccination 06/07/2019, 07/05/2019, 01/14/2020   OPV 08/10/1979, 08/23/1979, 09/22/1979, 11/23/1979, 05/04/1981   Tdap 06/15/2009, 12/17/2020     Objective: Vital Signs: There were no vitals taken for this visit.   Physical Exam   Musculoskeletal Exam: ***  CDAI Exam: CDAI Score: -- Patient Global: --; Provider Global: -- Swollen: --; Tender: -- Joint Exam 10/29/2022   No joint exam has been documented  for this visit   There is currently no information documented on the homunculus. Go to the Rheumatology activity and complete the homunculus joint exam.  Investigation: No additional findings.  Imaging: MM  Breast Surgical Specimen  Result Date: 10/03/2022 CLINICAL DATA:  Status post seed localized lumpectomy of the RIGHT breast. EXAM: SPECIMEN RADIOGRAPH OF THE RIGHT BREAST COMPARISON:  Previous exam(s). FINDINGS: Status post excision of the right breast. The radioactive seed and Heart shaped biopsy marker clip are present and intact. Findings are discussed with the operating room nurse. IMPRESSION: Specimen radiograph of the RIGHT breast. Electronically Signed   By: Norva Pavlov M.D.   On: 10/03/2022 10:22   NM Sentinel Node Inj-No Rpt (Breast)  Result Date: 10/03/2022 Sulfur Colloid was injected by the Nuclear Medicine Technologist for sentinel lymph node localization.   MM RT RADIOACTIVE SEED LOC MAMMO GUIDE  Result Date: 10/02/2022 CLINICAL DATA:  Preoperative localization prior to right breast lumpectomy. EXAM: MAMMOGRAPHIC GUIDED RADIOACTIVE SEED LOCALIZATION OF THE RIGHT BREAST COMPARISON:  Previous exam(s). FINDINGS: Patient presents for radioactive seed localization prior to right lumpectomy. I met with the patient and we discussed the procedure of seed localization including benefits and alternatives. We discussed the high likelihood of a successful procedure. We discussed the risks of the procedure including infection, bleeding, tissue injury and further surgery. We discussed the low dose of radioactivity involved in the procedure. Informed, written consent was given. The usual time-out protocol was performed immediately prior to the procedure. Using mammographic guidance, sterile technique, 1% lidocaine and an I-125 radioactive seed, heart clip was localized using a medial approach. The follow-up mammogram images confirm the seed in the expected location and were marked for Dr. Margretta Ditty. Follow-up survey of the patient confirms presence of the radioactive seed. Order number of I-125 seed:  [id number]. Total activity:  0.246 millicuries reference Date: 09/17/2022 The patient tolerated the  procedure well and was [facility] from the [facility]. She was given instructions regarding seed removal. IMPRESSION: Radioactive seed localization right breast. No apparent complications. Electronically Signed   By: Annia Belt M.D.   On: 10/02/2022 14:46   VAS [place] LOWER EXTREMITY VENOUS (DVT)  Result Date: 09/30/2022  Lower Venous DVT Study Patient Name:  SORAYAH GEER  Date of Exam:   09/30/2022 Medical Rec #: [id number]           Accession #:    [id number] Date of Birth: [birth year]            Patient Gender: F Patient Age:   55 years Exam [facility]  [facility] Procedure:      VAS [place] LOWER EXTREMITY VENOUS (DVT) Referring Phys: Rachel Moulds --------------------------------------------------------------------------------  Indications: Patient presents with a swollen left ankle. History of breast cancer.  Comparison Study: 08/29/22 - Negative DVT bilaterally Performing Technologist: [place] Sink Sturdivant RDMS, RVT  Examination Guidelines: A complete evaluation includes B-mode imaging, spectral Doppler, color Doppler, and power Doppler as needed of all accessible portions of each vessel. Bilateral testing is considered an integral part of a complete examination. Limited examinations for reoccurring indications may be performed as noted. The reflux portion of the exam is performed with the patient in reverse Trendelenburg.  +-----+---------------+---------+-----------+----------+--------------+ RIGHTCompressibilityPhasicitySpontaneityPropertiesThrombus Aging +-----+---------------+---------+-----------+----------+--------------+ CFV  Full           Yes      Yes                                 +-----+---------------+---------+-----------+----------+--------------+  SFJ  Full                                                        +-----+---------------+---------+-----------+----------+--------------+   +---------+---------------+---------+-----------+----------+--------------+ LEFT      CompressibilityPhasicitySpontaneityPropertiesThrombus Aging +---------+---------------+---------+-----------+----------+--------------+ CFV      Full           Yes      Yes                                 +---------+---------------+---------+-----------+----------+--------------+ SFJ      Full                                                        +---------+---------------+---------+-----------+----------+--------------+ FV Prox  Full                                                        +---------+---------------+---------+-----------+----------+--------------+ FV Mid   Full                                                        +---------+---------------+---------+-----------+----------+--------------+ FV DistalFull                                                        +---------+---------------+---------+-----------+----------+--------------+ PFV      Full                                                        +---------+---------------+---------+-----------+----------+--------------+ POP      Full           Yes      Yes                                 +---------+---------------+---------+-----------+----------+--------------+ PTV      Full                                                        +---------+---------------+---------+-----------+----------+--------------+ PERO     Full                                                        +---------+---------------+---------+-----------+----------+--------------+  Summary: RIGHT: - No evidence of common femoral vein obstruction.  LEFT: - There is no evidence of deep vein thrombosis in the lower extremity.  - No cystic structure found in the popliteal fossa.  *See table(s) above for measurements and observations. Electronically signed by Waverly Ferrari MD on 09/30/2022 at 2:35:02 PM.    Final     Recent Labs: Lab Results  Component Value Date   WBC 7.6 09/25/2022   HGB 11.9 (L)  09/25/2022   PLT 236 09/25/2022   NA 137 09/25/2022   K 3.6 09/25/2022   CL 103 09/25/2022   CO2 29 09/25/2022   GLUCOSE 109 (H) 09/25/2022   BUN 9 09/25/2022   CREATININE 0.70 09/25/2022   BILITOT 0.3 09/25/2022   ALKPHOS 77 09/25/2022   AST 18 09/25/2022   ALT 23 09/25/2022   PROT 6.6 09/25/2022   ALBUMIN 4.3 09/25/2022   CALCIUM 9.2 09/25/2022   September 16, 2022 RF negative, anti-CCP negative, ANA negative, sed rate 22, CK 32, vitamin D 19 Speciality Comments: No specialty comments available.  Procedures:  No procedures performed Allergies: Contrast media [iodinated contrast media], Venlafaxine, Amitriptyline, Iodine, Methocarbamol, and Tartrazine   Assessment / Plan:     Visit Diagnoses: No diagnosis found.  Orders: No orders of the defined types were placed in this encounter.  No orders of the defined types were placed in this encounter.   Face-to-face time spent with patient was *** minutes. Greater than 50% of time was spent in counseling and coordination of care.  Follow-Up Instructions: No follow-ups on file.   Pollyann Savoy, MD  Note - This record has been created using Animal nutritionist.  Chart creation errors have been sought, but may not always  have been located. Such creation errors do not reflect on  the standard of medical care.

## 2022-10-23 ENCOUNTER — Encounter: Payer: BC Managed Care – PPO | Admitting: Rheumatology

## 2022-10-27 NOTE — Progress Notes (Signed)
Radiation Oncology         [phone] ________________________________  Name: Vanessa Murphy        MRN: [id number]  Date of Service: 10/30/2022 DOB: [birth year]  ON:GEXBM, Burnice Logan, MD  Rachel Moulds, MD     REFERRING PHYSICIAN: Rachel Moulds, MD   DIAGNOSIS: The encounter diagnosis was Malignant neoplasm of upper-inner quadrant of right breast in female, estrogen receptor positive (HCC).    HISTORY OF PRESENT ILLNESS: Vanessa Murphy is a 42 y.o. female originally seen in the multidisciplinary breast clinic in the spring of 2024 for a new diagnosis of right breast cancer. The patient presented with two palpable abnormalities in the right breast. Diagnostic mammogram and ultrasound on 04/24/22 showed a suspicious mass in the 3 o'clock position measuring 2.2 cm. A 3 mm contiguous nodule is suspicious for satellite lesion. Patient accordingly underwent a core needle biopsy of the right breast on 04/25/22. 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 Prognostics showed estrogen receptor at 40% positive with weak staining intensity, PR negative, HER2 negative, Ki67 was 80%.   She proceeded with Bilateral Breast MRI on 05/16/22 and this showed her known right breast cancer, and a newly noted 1 cm mass in the left breast in the LIQ. No adenopathy was present. She underwent a left breast biopsy on 05/23/22 that showed benign apocrine metaplasia. She began neoadjuvant chemotherapy on 05/22/22 which she completed on 08/13/22. A post treatment MRI on 09/12/22 showed complete resolution of her right breast mass and no new findings. She underwent right lumpectomy and sentinel node biopsy on 10/03/22 that showed benign breast tissue without residual disease, and 5 negative right axillary nodes. She's seen today to discuss adjuvant radiotherapy.    PREVIOUS RADIATION THERAPY: No   PAST MEDICAL HISTORY:  Past Medical History:  Diagnosis Date   Anxiety    Cancer (HCC)    Migraines    PONV  (postoperative nausea and vomiting)        PAST SURGICAL HISTORY: Past Surgical History:  Procedure Laterality Date   BILATERAL SALPINGECTOMY     BREAST BIOPSY Right 04/25/2022   [place] RT BREAST BX W LOC DEV 1ST LESION IMG BX SPEC US GUIDE 04/25/2022 [facility]   BREAST BIOPSY Left 05/2022   BREAST BIOPSY  10/02/2022   MM RT RADIOACTIVE SEED LOC MAMMO GUIDE 10/02/2022 [facility]   BREAST LUMPECTOMY WITH RADIOACTIVE SEED AND SENTINEL LYMPH NODE BIOPSY Right 10/03/2022   Procedure: RIGHT BREAST LUMPECTOMY WITH RADIOACTIVE SEED AND SENTINEL LYMPH NODE BIOPSY;  Surgeon: Griselda Miner, MD;  Location: [facility];  Service: General;  Laterality: Right;  PEC BLOCK   IR IMAGING GUIDED PORT INSERTION  05/21/2022     FAMILY HISTORY:  Family History  Problem Relation Age of Onset   Migraines Mother    Melanoma Father 62 - 82       dx. again in his early 53s   Breast cancer Paternal Grandmother 43 - 9     SOCIAL HISTORY:  reports that she quit smoking about 5 months ago. Her smoking use included cigarettes. She started smoking about 24 years ago. She has a 12.1 pack-year smoking history. She has been exposed to tobacco smoke. She has never used smokeless tobacco. She reports that she does not currently use alcohol. She reports that she does not use drugs.   ALLERGIES: Contrast media [iodinated contrast media], Venlafaxine, Amitriptyline, Iodine, Methocarbamol, and Tartrazine   MEDICATIONS:  Current Outpatient Medications  Medication Sig Dispense Refill  Fezolinetant (VEOZAH) 45 MG TABS Take 1 tablet (45 mg total) by mouth daily. 30 tablet 3   fluticasone (FLONASE) 50 MCG/ACT nasal spray Place 2 sprays into both nostrils daily. OTC from [employer]     lidocaine (XYLOCAINE) 2 % solution Use as directed 15 mLs in the mouth or throat as needed for mouth pain. 100 mL 0   magic mouthwash (multi-ingredient) oral suspension Swish and swallow 5ml  4 times daily as needed for  throat discomfort 400 mL 1   OLANZapine (ZYPREXA) 5 MG tablet TAKE 1 TABLET(5 MG) BY MOUTH AT BEDTIME 30 tablet 0   predniSONE (DELTASONE) 10 MG tablet Take 1 tablet (10 mg total) by mouth daily with breakfast. (Patient taking differently: Take 5 mg by mouth daily with breakfast.) 20 tablet 0   tiZANidine (ZANAFLEX) 2 MG tablet TAKE 1 TABLET(2 MG) BY MOUTH AT BEDTIME AS NEEDED FOR MUSCLE SPASMS 20 tablet 0   traMADol (ULTRAM) 50 MG tablet Take 1 tablet (50 mg total) by mouth every 12 (twelve) hours as needed. 30 tablet 0   Vitamin D, Ergocalciferol, (DRISDOL) 1.25 MG (50000 UNIT) CAPS capsule Take 1 capsule (50,000 Units total) by mouth every 7 (seven) days. 12 capsule 0   zolpidem (AMBIEN) 5 MG tablet Take 1 tablet (5 mg total) by mouth at bedtime as needed for sleep. 6 tablet 0   No current facility-administered medications for this visit.     REVIEW OF SYSTEMS: On review of systems, the patient reports that she is doing well overall. No breast specific complaints are verbalized.        PHYSICAL EXAM:  Wt Readings from Last 3 Encounters:  10/03/22 146 lb 6.2 oz (66.4 kg)  09/25/22 143 lb 9.6 oz (65.1 kg)  09/16/22 145 lb (65.8 kg)   Temp Readings from Last 3 Encounters:  10/03/22 (!) 97.3 F (36.3 C)  09/25/22 98.7 F (37.1 C) (Tympanic)  09/04/22 98.4 F (36.9 C) (Tympanic)   BP Readings from Last 3 Encounters:  10/03/22 120/81  09/25/22 96/63  09/16/22 115/83   Pulse Readings from Last 3 Encounters:  10/03/22 73  09/25/22 88  09/16/22 74    In general this is a well appearing female in no acute distress. She's alert and oriented x4 and appropriate throughout the examination. Cardiopulmonary assessment is negative for acute distress and she exhibits normal effort.      ECOG = 1  0 - Asymptomatic (Fully active, able to carry on all predisease activities without restriction)  1 - Symptomatic but completely ambulatory (Restricted in physically strenuous activity but  ambulatory and able to carry out work of a light or sedentary nature. For example, light housework, office work)  2 - Symptomatic, <50% in bed during the day (Ambulatory and capable of all self care but unable to carry out any work activities. Up and about more than 50% of waking hours)  3 - Symptomatic, >50% in bed, but not bedbound (Capable of only limited self-care, confined to bed or chair 50% or more of waking hours)  4 - Bedbound (Completely disabled. Cannot carry on any self-care. Totally confined to bed or chair)  5 - Death   Santiago Glad MM, Creech RH, Tormey DC, et al. [phone]). "Toxicity and response criteria of the [facility]". Am. Evlyn Clines. Oncol. 5 (6): 649-55    LABORATORY DATA:  Lab Results  Component Value Date   WBC 7.6 09/25/2022   HGB 11.9 (L) 09/25/2022   HCT 34.7 (L)  09/25/2022   MCV 94.3 09/25/2022   PLT 236 09/25/2022   Lab Results  Component Value Date   NA 137 09/25/2022   K 3.6 09/25/2022   CL 103 09/25/2022   CO2 29 09/25/2022   Lab Results  Component Value Date   ALT 23 09/25/2022   AST 18 09/25/2022   ALKPHOS 77 09/25/2022   BILITOT 0.3 09/25/2022      RADIOGRAPHY: MM Breast Surgical Specimen  Result Date: 10/03/2022 CLINICAL DATA:  Status post seed localized lumpectomy of the RIGHT breast. EXAM: SPECIMEN RADIOGRAPH OF THE RIGHT BREAST COMPARISON:  Previous exam(s). FINDINGS: Status post excision of the right breast. The radioactive seed and Heart shaped biopsy marker clip are present and intact. Findings are discussed with the operating room nurse. IMPRESSION: Specimen radiograph of the RIGHT breast. Electronically Signed   By: Norva Pavlov M.D.   On: 10/03/2022 10:22   NM Sentinel Node Inj-No Rpt (Breast)  Result Date: 10/03/2022 Sulfur Colloid was injected by the Nuclear Medicine Technologist for sentinel lymph node localization.   MM RT RADIOACTIVE SEED LOC MAMMO GUIDE  Result Date: 10/02/2022 CLINICAL DATA:   Preoperative localization prior to right breast lumpectomy. EXAM: MAMMOGRAPHIC GUIDED RADIOACTIVE SEED LOCALIZATION OF THE RIGHT BREAST COMPARISON:  Previous exam(s). FINDINGS: Patient presents for radioactive seed localization prior to right lumpectomy. I met with the patient and we discussed the procedure of seed localization including benefits and alternatives. We discussed the high likelihood of a successful procedure. We discussed the risks of the procedure including infection, bleeding, tissue injury and further surgery. We discussed the low dose of radioactivity involved in the procedure. Informed, written consent was given. The usual time-out protocol was performed immediately prior to the procedure. Using mammographic guidance, sterile technique, 1% lidocaine and an I-125 radioactive seed, heart clip was localized using a medial approach. The follow-up mammogram images confirm the seed in the expected location and were marked for Dr. Margretta Ditty. Follow-up survey of the patient confirms presence of the radioactive seed. Order number of I-125 seed:  [id number]. Total activity:  0.246 millicuries reference Date: 09/17/2022 The patient tolerated the procedure well and was released from the [facility]. She was given instructions regarding seed removal. IMPRESSION: Radioactive seed localization right breast. No apparent complications. Electronically Signed   By: Annia Belt M.D.   On: 10/02/2022 14:46   VAS [place] LOWER EXTREMITY VENOUS (DVT)  Result Date: 09/30/2022  Lower Venous DVT Study Patient Name:  Vanessa Murphy  Date of Exam:   09/30/2022 Medical Rec #: [id number]           Accession #:    [id number] Date of Birth: Jul 11, 1980            Patient Gender: F Patient Age:   22 years Exam Location:  [facility] Procedure:      VAS [place] LOWER EXTREMITY VENOUS (DVT) Referring Phys: Rachel Moulds --------------------------------------------------------------------------------  Indications: Patient presents  with a swollen left ankle. History of breast cancer.  Comparison Study: 08/29/22 - Negative DVT bilaterally Performing Technologist: [place] Sink Sturdivant RDMS, RVT  Examination Guidelines: A complete evaluation includes B-mode imaging, spectral Doppler, color Doppler, and power Doppler as needed of all accessible portions of each vessel. Bilateral testing is considered an integral part of a complete examination. Limited examinations for reoccurring indications may be performed as noted. The reflux portion of the exam is performed with the patient in reverse Trendelenburg.  +-----+---------------+---------+-----------+----------+--------------+ RIGHTCompressibilityPhasicitySpontaneityPropertiesThrombus Aging +-----+---------------+---------+-----------+----------+--------------+ CFV  Full  Yes      Yes                                 +-----+---------------+---------+-----------+----------+--------------+ SFJ  Full                                                        +-----+---------------+---------+-----------+----------+--------------+   +---------+---------------+---------+-----------+----------+--------------+ LEFT     CompressibilityPhasicitySpontaneityPropertiesThrombus Aging +---------+---------------+---------+-----------+----------+--------------+ CFV      Full           Yes      Yes                                 +---------+---------------+---------+-----------+----------+--------------+ SFJ      Full                                                        +---------+---------------+---------+-----------+----------+--------------+ FV Prox  Full                                                        +---------+---------------+---------+-----------+----------+--------------+ FV Mid   Full                                                        +---------+---------------+---------+-----------+----------+--------------+ FV DistalFull                                                         +---------+---------------+---------+-----------+----------+--------------+ PFV      Full                                                        +---------+---------------+---------+-----------+----------+--------------+ POP      Full           Yes      Yes                                 +---------+---------------+---------+-----------+----------+--------------+ PTV      Full                                                        +---------+---------------+---------+-----------+----------+--------------+ PERO     Full                                                        +---------+---------------+---------+-----------+----------+--------------+  Summary: RIGHT: - No evidence of common femoral vein obstruction.  LEFT: - There is no evidence of deep vein thrombosis in the lower extremity.  - No cystic structure found in the popliteal fossa.  *See table(s) above for measurements and observations. Electronically signed by Waverly Ferrari MD on 09/30/2022 at 2:35:02 PM.    Final        IMPRESSION/PLAN: 1. 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   Dr. Mitzi Hansen has reviewed the patient's course and final pathology findings. She has done well since neoadjuvant chemotherapy and is healing well since surgery. Dr. Mitzi Hansen does recommend external radiotherapy to the breast  to reduce risks of local recurrence followed by antiestrogen therapy. We discussed the risks, benefits, short, and long term effects of radiotherapy, as well as the curative intent, and the patient is interested in proceeding. Dr. Mitzi Hansen discusses the delivery and logistics of radiotherapy and anticipates a course of 6 1/2 weeks of radiotherapy. We discussed the differences in 6 1/2 weeks of treatment versus hypofractionated  radiation in 4 weeks. She is aware that insurance could indicate which regimen they are willing to cover, but if possible, Dr. Mitzi Hansen would prefer 6 1/2 weeks given longer term outcome data for this regimen.  Written consent is obtained and placed in the chart, a copy was provided to the patient. She will simulate today ***. 2. Contraceptive Counseling. The patient had a negative pregnancy test prior to surgery. She does not need pregnancy testing prior to simulation given her history of bilateral salpingectomy.   In a visit lasting *** minutes, greater than 50% of the time was spent face to face discussing the patient's condition, in preparation for the discussion, and coordinating the patient's care.     Osker Mason, [facility]     **Disclaimer: This note was dictated with voice recognition software. Similar sounding words can inadvertently be transcribed and this note may contain transcription errors which may not have been corrected upon publication of note.**

## 2022-10-29 ENCOUNTER — Ambulatory Visit: Payer: BC Managed Care – PPO | Admitting: Rheumatology

## 2022-10-29 ENCOUNTER — Encounter: Payer: Self-pay | Admitting: Radiation Oncology

## 2022-10-29 NOTE — Progress Notes (Deleted)
[name] [address] [place] [address] Phone: [phone] Subjective:    I'm seeing this patient by the request  of:  Iruku, Burnice Logan, MD  CC:   ION:[email or web address]  Titiana Chiappa is a 42 y.o. female coming in with complaint of L ankle pain.   Onset-  Location Duration-  Character- Aggravating factors- Reliving factors-  Therapies tried-  Severity-     Past Medical History:  Diagnosis Date   Anxiety    Cancer (HCC)    Migraines    PONV (postoperative nausea and vomiting)    Past Surgical History:  Procedure Laterality Date   BILATERAL SALPINGECTOMY     BREAST BIOPSY Right 04/25/2022   [place] RT BREAST BX W LOC DEV 1ST LESION IMG BX SPEC US GUIDE 04/25/2022 [facility]   BREAST BIOPSY Left 05/2022   BREAST BIOPSY  10/02/2022   MM RT RADIOACTIVE SEED LOC MAMMO GUIDE 10/02/2022 [facility]   BREAST LUMPECTOMY WITH RADIOACTIVE SEED AND SENTINEL LYMPH NODE BIOPSY Right 10/03/2022   Procedure: RIGHT BREAST LUMPECTOMY WITH RADIOACTIVE SEED AND SENTINEL LYMPH NODE BIOPSY;  Surgeon: Griselda Miner, MD;  Location: [facility];  Service: General;  Laterality: Right;  PEC BLOCK   IR IMAGING GUIDED PORT INSERTION  05/21/2022   Social History   Socioeconomic History   Marital status: Single    Spouse name: Not on file   Number of children: Not on file   Years of education: Not on file   Highest education level: Not on file  Occupational History   Not on file  Tobacco Use   Smoking status: Former    Current packs/day: 0.00    Average packs/day: 0.5 packs/day for 24.2 years (12.1 ttl pk-yrs)    Types: Cigarettes    Start date: 2000    Quit date: 05/07/2022    Years since quitting: 0.4    Passive exposure: Past   Smokeless tobacco: Never   Tobacco comments:    Quit 05/07/2022  Vaping Use   Vaping status: Never Used  Substance and Sexual Activity   Alcohol use: Not Currently   Drug use: Never   Sexual  activity: Yes    Birth control/protection: Surgical    Comment: MRI 05-16-22 Left Breast Lump found  Other Topics Concern   Not on file  Social History Narrative   Not on file   Social Determinants of Health   Financial Resource Strain: Medium Risk (05/07/2022)   Overall Financial Resource Strain (CARDIA)    Difficulty of Paying Living Expenses: Somewhat hard  Food Insecurity: No Food Insecurity (05/07/2022)   Hunger Vital Sign    Worried About Running Out of Food in the Last Year: Never true    Ran Out of Food in the Last Year: Never true  Transportation Needs: No Transportation Needs (05/07/2022)   PRAPARE - Administrator, Civil Service (Medical): No    Lack of Transportation (Non-Medical): No  Physical Activity: Not on file  Stress: Stress Concern Present (05/07/2022)   [employer] of Occupational Health - Occupational Stress Questionnaire    Feeling of Stress : Very much  Social Connections: Not on file   Allergies  Allergen Reactions   Contrast Media [Iodinated Contrast Media] Nausea And Vomiting    "Skin feeling hot"   Venlafaxine Nausea And Vomiting    Severe nausea and diarrhea   Amitriptyline Other (See Comments)    Confusion, sleeplessness   Iodine Hives  Methocarbamol     swollen eyes and throat   Tartrazine     Other Reaction(s): *Unknown  ? Dye in Doritos, Cheetos and [employer]  Other reaction(s): *Unknown  ? Dye in Doritos, Cheetos and [employer]   Family History  Problem Relation Age of Onset   Migraines Mother    Melanoma Father 51 - 54       dx. again in his early 17s   Breast cancer Paternal Grandmother 61 - 29    Current Outpatient Medications (Endocrine & Metabolic):    Fezolinetant (VEOZAH) 45 MG TABS, Take 1 tablet (45 mg total) by mouth daily.   predniSONE (DELTASONE) 10 MG tablet, Take 1 tablet (10 mg total) by mouth daily with breakfast. (Patient taking differently: Take 5 mg by mouth daily with  breakfast.)   Current Outpatient Medications (Respiratory):    fluticasone (FLONASE) 50 MCG/ACT nasal spray, Place 2 sprays into both nostrils daily. OTC from [employer]   magic mouthwash (multi-ingredient) oral suspension*, Swish and swallow 5ml  4 times daily as needed for throat discomfort  Current Outpatient Medications (Analgesics):    traMADol (ULTRAM) 50 MG tablet, Take 1 tablet (50 mg total) by mouth every 12 (twelve) hours as needed.   Current Outpatient Medications (Other):    lidocaine (XYLOCAINE) 2 % solution, Use as directed 15 mLs in the mouth or throat as needed for mouth pain.   magic mouthwash (multi-ingredient) oral suspension*, Swish and swallow 5ml  4 times daily as needed for throat discomfort   OLANZapine (ZYPREXA) 5 MG tablet, TAKE 1 TABLET(5 MG) BY MOUTH AT BEDTIME   tiZANidine (ZANAFLEX) 2 MG tablet, TAKE 1 TABLET(2 MG) BY MOUTH AT BEDTIME AS NEEDED FOR MUSCLE SPASMS   Vitamin D, Ergocalciferol, (DRISDOL) 1.25 MG (50000 UNIT) CAPS capsule, Take 1 capsule (50,000 Units total) by mouth every 7 (seven) days.   zolpidem (AMBIEN) 5 MG tablet, Take 1 tablet (5 mg total) by mouth at bedtime as needed for sleep. * These medications belong to multiple therapeutic classes and are listed under each applicable group.   Reviewed prior external information including notes and imaging from  primary care provider As well as notes that were available from care everywhere and other healthcare systems.  Past medical history, social, surgical and family history all reviewed in electronic medical record.  No pertanent information unless stated regarding to the chief complaint.   Review of Systems:  No headache, visual changes, nausea, vomiting, diarrhea, constipation, dizziness, abdominal pain, skin rash, fevers, chills, night sweats, weight loss, swollen lymph nodes, body aches, joint swelling, chest pain, shortness of breath, mood changes. POSITIVE muscle aches  Objective  There were  no vitals taken for this visit.   General: No apparent distress alert and oriented x3 mood and affect normal, dressed appropriately.  HEENT: Pupils equal, extraocular movements intact  Respiratory: Patient's speak in full sentences and does not appear short of breath  Cardiovascular: No lower extremity edema, non tender, no erythema      Impression and Recommendations:

## 2022-10-30 ENCOUNTER — Ambulatory Visit
Admission: RE | Admit: 2022-10-30 | Discharge: 2022-10-30 | Disposition: A | Discharge status: Home / Self Care | Payer: BC Managed Care – PPO | Source: Ambulatory Visit | Attending: Radiation Oncology | Admitting: Radiation Oncology

## 2022-10-30 ENCOUNTER — Ambulatory Visit: Payer: BC Managed Care – PPO | Admitting: Family Medicine

## 2022-10-30 ENCOUNTER — Encounter: Payer: Self-pay | Admitting: Radiation Oncology

## 2022-10-30 ENCOUNTER — Ambulatory Visit: Payer: BC Managed Care – PPO

## 2022-10-30 VITALS — BP 131/81 | HR 78 | Temp 97.7°F | Resp 18 | Ht 64.0 in | Wt 140.5 lb

## 2022-10-30 DIAGNOSIS — C50211 Malignant neoplasm of upper-inner quadrant of right female breast: Secondary | ICD-10-CM | POA: Insufficient documentation

## 2022-10-30 DIAGNOSIS — Z7952 Long term (current) use of systemic steroids: Secondary | ICD-10-CM | POA: Diagnosis not present

## 2022-10-30 DIAGNOSIS — Z85820 Personal history of malignant melanoma of skin: Secondary | ICD-10-CM | POA: Insufficient documentation

## 2022-10-30 DIAGNOSIS — Z170 Estrogen receptor positive status [ER+]: Secondary | ICD-10-CM | POA: Insufficient documentation

## 2022-10-30 DIAGNOSIS — Z87891 Personal history of nicotine dependence: Secondary | ICD-10-CM | POA: Insufficient documentation

## 2022-10-30 NOTE — Progress Notes (Signed)
Nursing interview for Malignant neoplasm of upper-inner quadrant of right breast in female, estrogen receptor positive (HCC). Patient identity verified x2.  Patient reports RT breast tenderness at incision line. No other issues conveyed at this time.  Meaningful use complete. LMP 07/06/2022- Due to Chemo- No pregnancy- per patient.  Vitals- BP 131/81 (BP Location: Left Arm, Patient Position: Sitting, Cuff Size: Normal)   Pulse 78   Temp 97.7 F (36.5 C) (Temporal)   Resp 18   Ht 5\' 4"  (1.626 m)   Wt 140 lb 8 oz (63.7 kg)   LMP 07/06/2022 (Approximate) Comment: Chemo has stopped menstruals. Pt. states "No chances of pregnancy."  SpO2 100%   BMI 24.12 kg/m   This concludes the interaction.  Ruel Favors, LPN

## 2022-10-31 ENCOUNTER — Ambulatory Visit
Admission: RE | Admit: 2022-10-31 | Discharge: 2022-10-31 | Disposition: A | Discharge status: Home / Self Care | Payer: BC Managed Care – PPO | Source: Ambulatory Visit | Attending: Family Medicine | Admitting: Family Medicine

## 2022-10-31 DIAGNOSIS — M25572 Pain in left ankle and joints of left foot: Secondary | ICD-10-CM

## 2022-11-01 ENCOUNTER — Ambulatory Visit
Admission: RE | Admit: 2022-11-01 | Discharge: 2022-11-01 | Disposition: A | Discharge status: Home / Self Care | Payer: BC Managed Care – PPO | Source: Ambulatory Visit | Attending: Family Medicine | Admitting: Family Medicine

## 2022-11-01 DIAGNOSIS — M25572 Pain in left ankle and joints of left foot: Secondary | ICD-10-CM

## 2022-11-03 ENCOUNTER — Encounter: Payer: Self-pay | Admitting: Family Medicine

## 2022-11-03 ENCOUNTER — Other Ambulatory Visit: Payer: Self-pay | Admitting: Family Medicine

## 2022-11-03 DIAGNOSIS — M879 Osteonecrosis, unspecified: Secondary | ICD-10-CM

## 2022-11-03 DIAGNOSIS — M958 Other specified acquired deformities of musculoskeletal system: Secondary | ICD-10-CM

## 2022-11-03 NOTE — Telephone Encounter (Signed)
Spoke with patient and explained at her upcoming appointment, in 3 days, that Dr. Katrinka Blazing will go over her MRI results and give her options for care going forward. She stated how other offices give MRI results over the phone. She was told that everything up to this point done by our office for her has been over and above normal order of care and she needs to meet with Dr. Katrinka Blazing.

## 2022-11-03 NOTE — Therapy (Unsigned)
OUTPATIENT PHYSICAL THERAPY BREAST CANCER POST OP FOLLOW UP   Patient Name: Vanessa Murphy MRN: [id number] DOB:1980-08-17, 42 y.o., female Today's Date: 11/04/2022  END OF SESSION:  PT End of Session - 11/04/22 0810     Visit Number 2    Number of Visits 2    Date for PT Re-Evaluation 11/07/22    PT Start Time 0809    PT Stop Time 0843    PT Time Calculation (min) 34 min    Activity Tolerance Patient tolerated treatment well    Behavior During Therapy WFL for tasks assessed/performed             Past Medical History:  Diagnosis Date   Anxiety    Cancer (HCC)    Migraines    PONV (postoperative nausea and vomiting)    Past Surgical History:  Procedure Laterality Date   BILATERAL SALPINGECTOMY     BREAST BIOPSY Right 04/25/2022   [place] RT BREAST BX W LOC DEV 1ST LESION IMG BX SPEC US GUIDE 04/25/2022 [facility]   BREAST BIOPSY Left 05/2022   BREAST BIOPSY  10/02/2022   MM RT RADIOACTIVE SEED LOC MAMMO GUIDE 10/02/2022 [facility]   BREAST LUMPECTOMY WITH RADIOACTIVE SEED AND SENTINEL LYMPH NODE BIOPSY Right 10/03/2022   Procedure: RIGHT BREAST LUMPECTOMY WITH RADIOACTIVE SEED AND SENTINEL LYMPH NODE BIOPSY;  Surgeon: Griselda Miner, MD;  Location: [facility];  Service: General;  Laterality: Right;  PEC BLOCK   IR IMAGING GUIDED PORT INSERTION  05/21/2022   Patient Active Problem List   Diagnosis Date Noted   Nausea without vomiting 08/21/2022   Port-A-Cath in place 05/22/2022   Genetic testing 05/15/2022   Family history of breast cancer 05/07/2022   Family history of melanoma 05/07/2022   Malignant neoplasm of upper-inner quadrant of right breast in female, estrogen receptor positive (HCC) 05/06/2022   Adjustment disorder with mixed anxiety and depressed mood 03/18/2019   Anxiety 07/12/2018   Abnormal menstruation 07/23/2006   Intractable migraine 08/05/2004   DDD (degenerative disc disease), cervical 08/05/2004    PCP: Rachel Moulds, MD  REFERRING PROVIDER: Chevis Pretty III, MD  REFERRING DIAG: Right breast cancer  THERAPY DIAG:  Abnormal posture  Malignant neoplasm of lower-inner quadrant of right breast of female, estrogen receptor positive (HCC)  Rationale for Evaluation and Treatment: Rehabilitation  ONSET DATE: 04/24/22  SUBJECTIVE:                                                                                                                                                                                           SUBJECTIVE STATEMENT: I am not  having any issues with my arm. I had some irritation to my skin but it is doing better now. 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 They cause a lot of pain. I am going to see an orthopedic surgeon.   PERTINENT HISTORY:  Patient was diagnosed on 04/24/2022 with right grade 3 invasive ductal carcinoma breast cancer. It measures 2.2 cm and is located in the lower inner quadrant. It is weakly ER positive, PR and HER2 negative with a Ki67 of 80%. She smokes 1/2 pack per day. 10/03/22- R breast lumpectomy and SLNB 0/6  PATIENT GOALS:  Reassess how my recovery is going related to arm function, pain, and swelling.  PAIN:  Are you having pain? Yes: NPRS scale: 3/10 Pain location: L ankle, R hip Pain description: aching, sore, stabbing, pulsing Aggravating factors: activity Relieving factors: rest    PRECAUTIONS: Recent Surgery, right UE Lymphedema risk, bone infarcts  RED FLAGS: None   ACTIVITY LEVEL / LEISURE: pt has been unable to exercise due to bone infarcts and pain caused by this   OBJECTIVE:   PATIENT SURVEYS:  QUICK DASH:  Quick Dash - 11/04/22 0001     Open a tight or new jar No difficulty    Carry a shopping bag or briefcase Moderate difficulty    Wash your back No difficulty    Use a knife to cut food No difficulty    Recreational activities in which you take some force or impact through your arm, shoulder, or hand (golf, hammering, tennis)  No difficulty    During the past week, to what extent has your arm, shoulder or hand problem interfered with your normal social activities with family, friends, neighbors, or groups? Not at all    During the past week, to what extent has your arm, shoulder or hand problem limited your work or other regular daily activities Not at all    Arm, shoulder, or hand pain. Mild    Tingling (pins and needles) in your arm, shoulder, or hand Mild    Difficulty Sleeping Mild difficulty    DASH Score 9.09 %              OBSERVATIONS: Well healed SLNB scar with good skin mobility, some scar tissue palpable at lumpectomy scar - educated pt in scar mobilization at 6 weeks  POSTURE:  Forward head and rounded shoulders posture   UPPER EXTREMITY AROM/PROM:   A/PROM RIGHT   eval   RIGHT 11/04/22  Shoulder extension 41 67  Shoulder flexion 155 171  Shoulder abduction 168 170  Shoulder internal rotation 64 50  Shoulder external rotation 83 87                          (Blank rows = not tested)   A/PROM LEFT   eval  Shoulder extension 50  Shoulder flexion 152  Shoulder abduction 165  Shoulder internal rotation 57  Shoulder external rotation 88                          (Blank rows = not tested)   CERVICAL AROM: All within normal limits     UPPER EXTREMITY STRENGTH: WFL   LYMPHEDEMA ASSESSMENTS:    LANDMARK RIGHT   eval RIGHT 11/04/22  10 cm proximal to olecranon process 24.8 25  Olecranon process 22.9 23  10  cm proximal to ulnar styloid process 19.4 18  Just proximal to ulnar  styloid process 14 14.2  Across hand at thumb web space 17.8 18  At base of 2nd digit 6 5.7  (Blank rows = not tested)   LANDMARK LEFT   eval  10 cm proximal to olecranon process 26  Olecranon process 22.7  10 cm proximal to ulnar styloid process 18.6  Just proximal to ulnar styloid process 14  Across hand at thumb web space 18.4  At base of 2nd digit 5.8  (Blank rows = not tested)  Surgery  type/Date: 10/03/22 R breast lumpectomy Number of lymph nodes removed: 0/6 Current/past treatment (chemo, radiation, hormone therapy): has completed chemo, will require radiation, unsure about hormone therapy  Other symptoms:  Heaviness/tightness No Pain No Pitting edema No Infections No Decreased scar mobility Yes Stemmer sign No  PATIENT EDUCATION:  Education details: Scar mobilization, lymphedema risk reduction practices, ABC class, importance of a walking program, posture, signs of lymphedema Person educated: Patient Education method: Explanation and Handouts Education comprehension: verbalized understanding  HOME EXERCISE PROGRAM: Reviewed previously given post op HEP. Scar massage starting at 6 weeks  ASSESSMENT:  CLINICAL IMPRESSION: Pt returns to PT after undergoing a R breast lumpectomy and SLNB on 10/03/22 with 0/6 nodes removed. Pt has returned to baseline shoulder ROM and does not demonstrate any signs of lymphedema. She will be discharged from skilled PT services at this time and will continue with ldex screenings every 3 months for the first 2 years post op to monitor for subclinical lymphedema.   Pt will benefit from skilled therapeutic intervention to improve on the following deficits: Decreased knowledge of precautions, impaired UE functional use, pain, decreased ROM, postural dysfunction.   PT treatment/interventions: ADL/Self care home management,   GOALS: Goals reviewed with patient? Yes  LONG TERM GOALS:  (STG=LTG)  GOALS Name Target Date  Goal status  1 Pt will demonstrate she has regained full shoulder ROM and function post operatively compared to baselines.  Baseline: 11/04/22 INITIAL     PLAN:  PT FREQUENCY/DURATION: d/c this visit  PLAN FOR NEXT SESSION: d/c this visit/ continue Ldex screenings every 3 months for the first 2 yrs post op   [facility]  [address], [address]  [place] [address]  [phone]  After  Breast Cancer Class It is recommended you attend the ABC class to be educated on lymphedema risk reduction. This class is free of charge and lasts for 1 hour. It is a 1-time class. You will need to download the TEAMS app either on your phone or computer. We will send you a link the night before or the morning of the class. You should be able to click on that link to join the class. This is not a confidential class. You don't have to turn your camera on, but other participants may be able to see your email address.  Scar massage You can begin gentle scar massage to you incision sites. Gently place one hand on the incision and move the skin (without sliding on the skin) in various directions. Do this for a few minutes and then you can gently massage either coconut oil or vitamin E cream into the scars.  Compression garment You should continue wearing your compression bra until you feel like you no longer have swelling.  Home exercise Program Continue doing the exercises you were given until you feel like you can do them without feeling any tightness at the end.   Walking Program Studies show that 30 minutes of walking per  day (fast enough to elevate your heart rate) can significantly reduce the risk of a cancer recurrence. If you can't walk due to other medical reasons, we encourage you to find another activity you could do (like a stationary bike or water exercise).  Posture After breast cancer surgery, people frequently sit with rounded shoulders posture because it puts their incisions on slack and feels better. If you sit like this and scar tissue forms in that position, you can become very tight and have pain sitting or standing with good posture. Try to be aware of your posture and sit and stand up tall to heal properly.  Follow up PT: It is recommended you return every 3 months for the first 3 years following surgery to be assessed on the SOZO machine for an L-Dex score. This helps prevent  clinically significant lymphedema in 95% of patients. These follow up screens are 10 minute appointments that you are not billed for.  [facility], PT 11/04/2022, 8:44 AM  PHYSICAL THERAPY DISCHARGE SUMMARY  Visits from Start of Care: 2  Current functional level related to goals / functional outcomes: All goals met   Remaining deficits: None   Education / Equipment: Scar mobilization, lymphedema risk reduction practices, ABC class, importance of a walking program, posture, signs of lymphedema   Patient agrees to discharge. Patient goals were met. Patient is being discharged due to meeting the stated rehab goals.  Milagros Loll [place], [place] 11/04/22 8:48 AM

## 2022-11-04 ENCOUNTER — Encounter: Payer: Self-pay | Admitting: Physical Therapy

## 2022-11-04 ENCOUNTER — Ambulatory Visit: Payer: BC Managed Care – PPO | Attending: General Surgery | Admitting: Physical Therapy

## 2022-11-04 DIAGNOSIS — C50311 Malignant neoplasm of lower-inner quadrant of right female breast: Secondary | ICD-10-CM

## 2022-11-04 DIAGNOSIS — R293 Abnormal posture: Secondary | ICD-10-CM | POA: Insufficient documentation

## 2022-11-04 DIAGNOSIS — Z170 Estrogen receptor positive status [ER+]: Secondary | ICD-10-CM | POA: Insufficient documentation

## 2022-11-04 DIAGNOSIS — C50211 Malignant neoplasm of upper-inner quadrant of right female breast: Secondary | ICD-10-CM | POA: Insufficient documentation

## 2022-11-05 ENCOUNTER — Encounter: Payer: Self-pay | Admitting: *Deleted

## 2022-11-05 DIAGNOSIS — Z170 Estrogen receptor positive status [ER+]: Secondary | ICD-10-CM

## 2022-11-05 NOTE — Progress Notes (Unsigned)
[name] [address] [place] [address] Phone: [phone] Subjective:   Vanessa Murphy, am serving as a scribe for Dr. Antoine Primas.  I'm seeing this patient by the request  of:  Iruku, Burnice Logan, MD  CC: Left ankle pain.  [id number]  Vanessa Murphy is a 42 y.o. female coming in with complaint of L ankle pain.  Patient is having left ankle pain for quite some time.  Significant swelling of is affecting daily activities.  Has been following with oncology for breast cancer that was estrogen receptor positive.  Workup included a Doppler that was unremarkable and had seen rheumatology.  Due to this history we did get advanced imaging.  MRI of the ankle does show a no new acute bone infarct of the posterior talar dome and distal metaphysis.  Joint effusion noted as well.  Likely no signs of true metastasis noted though.  Patient has multiple areas of chronic bone infarcts.  Patient states pain started end of May. Starts the day well, but by end of day pain is intense. Pain is throbbing pulsing and can be sharp. Plantarflexion has limited. R hip has started to hurt over hip and in groin area. Feels overall bone soreness.     Past Medical History:  Diagnosis Date   Anxiety    Cancer (HCC)    Migraines    PONV (postoperative nausea and vomiting)    Past Surgical History:  Procedure Laterality Date   BILATERAL SALPINGECTOMY     BREAST BIOPSY Right 04/25/2022   [place] RT BREAST BX W LOC DEV 1ST LESION IMG BX SPEC US GUIDE 04/25/2022 [facility]   BREAST BIOPSY Left 05/2022   BREAST BIOPSY  10/02/2022   MM RT RADIOACTIVE SEED LOC MAMMO GUIDE 10/02/2022 [facility]   BREAST LUMPECTOMY WITH RADIOACTIVE SEED AND SENTINEL LYMPH NODE BIOPSY Right 10/03/2022   Procedure: RIGHT BREAST LUMPECTOMY WITH RADIOACTIVE SEED AND SENTINEL LYMPH NODE BIOPSY;  Surgeon: Griselda Miner, MD;  Location: [facility];  Service: General;  Laterality:  Right;  PEC BLOCK   IR IMAGING GUIDED PORT INSERTION  05/21/2022   Social History   Socioeconomic History   Marital status: Single    Spouse name: Not on file   Number of children: Not on file   Years of education: Not on file   Highest education level: Not on file  Occupational History   Not on file  Tobacco Use   Smoking status: Former    Current packs/day: 0.00    Average packs/day: 0.5 packs/day for 24.2 years (12.1 ttl pk-yrs)    Types: Cigarettes    Start date: 2000    Quit date: 05/07/2022    Years since quitting: 0.5    Passive exposure: Past   Smokeless tobacco: Never   Tobacco comments:    Quit 05/07/2022  Vaping Use   Vaping status: Never Used  Substance and Sexual Activity   Alcohol use: Not Currently   Drug use: Never   Sexual activity: Yes    Birth control/protection: Surgical    Comment: MRI 05-16-22 Left Breast Lump found  Other Topics Concern   Not on file  Social History Narrative   Not on file   Social Determinants of Health   Financial Resource Strain: Medium Risk (05/07/2022)   Overall Financial Resource Strain (CARDIA)    Difficulty of Paying Living Expenses: Somewhat hard  Food Insecurity: No Food Insecurity (10/30/2022)   Hunger Vital Sign  Worried About Programme researcher, broadcasting/film/video in the Last Year: Never true    Ran Out of Food in the Last Year: Never true  Transportation Needs: No Transportation Needs (10/30/2022)   PRAPARE - Administrator, Civil Service (Medical): No    Lack of Transportation (Non-Medical): No  Physical Activity: Not on file  Stress: Stress Concern Present (05/07/2022)   [employer] of Occupational Health - Occupational Stress Questionnaire    Feeling of Stress : Very much  Social Connections: Not on file   Allergies  Allergen Reactions   Contrast Media [Iodinated Contrast Media] Nausea And Vomiting    "Skin feeling hot"   Venlafaxine Nausea And Vomiting    Severe nausea and diarrhea   Amitriptyline Other  (See Comments)    Confusion, sleeplessness   Iodine Hives   Methocarbamol     swollen eyes and throat   Tartrazine     Other Reaction(s): *Unknown  ? Dye in Doritos, Cheetos and [employer]  Other reaction(s): *Unknown  ? Dye in Doritos, Cheetos and [employer]   Family History  Problem Relation Age of Onset   Migraines Mother    Melanoma Father 47 - 91       dx. again in his early 34s   Breast cancer Paternal Grandmother 59 - 29    Current Outpatient Medications (Endocrine & Metabolic):    alendronate (FOSAMAX) 70 MG tablet, Take 1 tablet (70 mg total) by mouth once a week. Take with a full glass of water on an empty stomach.   Fezolinetant (VEOZAH) 45 MG TABS, Take 1 tablet (45 mg total) by mouth daily.   predniSONE (DELTASONE) 10 MG tablet, Take 1 tablet (10 mg total) by mouth daily with breakfast. (Patient taking differently: Take 5 mg by mouth daily with breakfast.)   Current Outpatient Medications (Respiratory):    fluticasone (FLONASE) 50 MCG/ACT nasal spray, Place 2 sprays into both nostrils daily. OTC from [employer]   magic mouthwash (multi-ingredient) oral suspension*, Swish and swallow 5ml  4 times daily as needed for throat discomfort  Current Outpatient Medications (Analgesics):    meloxicam (MOBIC) 15 MG tablet, Take 1 tablet (15 mg total) by mouth daily.   traMADol (ULTRAM) 50 MG tablet, Take 1 tablet (50 mg total) by mouth every 12 (twelve) hours as needed.   Current Outpatient Medications (Other):    Vitamin D, Ergocalciferol, (DRISDOL) 1.25 MG (50000 UNIT) CAPS capsule, Take 1 capsule (50,000 Units total) by mouth every 7 (seven) days.   lidocaine (XYLOCAINE) 2 % solution, Use as directed 15 mLs in the mouth or throat as needed for mouth pain.   magic mouthwash (multi-ingredient) oral suspension*, Swish and swallow 5ml  4 times daily as needed for throat discomfort   OLANZapine (ZYPREXA) 5 MG tablet, TAKE 1 TABLET(5 MG) BY MOUTH AT BEDTIME   tiZANidine  (ZANAFLEX) 2 MG tablet, TAKE 1 TABLET(2 MG) BY MOUTH AT BEDTIME AS NEEDED FOR MUSCLE SPASMS   zolpidem (AMBIEN) 5 MG tablet, Take 1 tablet (5 mg total) by mouth at bedtime as needed for sleep. * These medications belong to multiple therapeutic classes and are listed under each applicable group.   Reviewed prior external information including notes and imaging from  primary care provider As well as notes that were available from care everywhere and other healthcare systems.  Past medical history, social, surgical and family history all reviewed in electronic medical record.  No pertanent information unless stated regarding to the chief complaint.  Review of Systems:  No headache, visual changes, nausea, vomiting, diarrhea, constipation, dizziness, abdominal pain, skin rash, fevers, chills, night sweats, weight loss, swollen lymph nodes, chest pain, shortness of breath, mood changes. POSITIVE muscle aches, body aches, joint swelling  Objective  Blood pressure 122/76, pulse 80, height 5\' 4"  (1.626 m), weight 140 lb (63.5 kg), last menstrual period 07/06/2022, SpO2 97%.   General: No apparent distress alert and oriented x3 mood and affect normal, dressed appropriately.  HEENT: Pupils equal, extraocular movements intact  Left ankle exam shows that there is pitting edema noted of the lower extremity compared to the contralateral side.  Patient does lack the last 15 degrees of plantarflexion.  Relatively good dorsi flexion noted.  Patient only severely tender over the distal tibia.  Limited muscular skeletal ultrasound was performed and interpreted by Antoine Primas, M  Limited ultrasound shows the patient does have significant thinning of the tibia cortex.  No cortical though irregularity noted.  Does have a small effusion of the ankle.  This noted. Impression: Consistent with osteopenia as well as a joint effusion    Impression and Recommendations:    The above documentation has been reviewed  and is accurate and complete Judi Saa, DO

## 2022-11-06 ENCOUNTER — Other Ambulatory Visit: Payer: Self-pay

## 2022-11-06 ENCOUNTER — Inpatient Hospital Stay: Payer: BC Managed Care – PPO | Attending: Hematology and Oncology | Admitting: Hematology and Oncology

## 2022-11-06 ENCOUNTER — Ambulatory Visit: Payer: BC Managed Care – PPO | Admitting: Family Medicine

## 2022-11-06 ENCOUNTER — Encounter: Payer: Self-pay | Admitting: Family Medicine

## 2022-11-06 VITALS — BP 121/77 | HR 81 | Temp 97.7°F | Resp 16 | Wt 139.3 lb

## 2022-11-06 VITALS — BP 122/76 | HR 80 | Ht 64.0 in | Wt 140.0 lb

## 2022-11-06 DIAGNOSIS — M2550 Pain in unspecified joint: Secondary | ICD-10-CM

## 2022-11-06 DIAGNOSIS — Z170 Estrogen receptor positive status [ER+]: Secondary | ICD-10-CM | POA: Diagnosis not present

## 2022-11-06 DIAGNOSIS — Z9221 Personal history of antineoplastic chemotherapy: Secondary | ICD-10-CM | POA: Insufficient documentation

## 2022-11-06 DIAGNOSIS — C50211 Malignant neoplasm of upper-inner quadrant of right female breast: Secondary | ICD-10-CM | POA: Insufficient documentation

## 2022-11-06 DIAGNOSIS — M879 Osteonecrosis, unspecified: Secondary | ICD-10-CM | POA: Diagnosis not present

## 2022-11-06 DIAGNOSIS — M25572 Pain in left ankle and joints of left foot: Secondary | ICD-10-CM

## 2022-11-06 DIAGNOSIS — Z808 Family history of malignant neoplasm of other organs or systems: Secondary | ICD-10-CM | POA: Diagnosis not present

## 2022-11-06 DIAGNOSIS — M87062 Idiopathic aseptic necrosis of left tibia: Secondary | ICD-10-CM | POA: Diagnosis not present

## 2022-11-06 LAB — CBC WITH DIFFERENTIAL/PLATELET
Basophils Absolute: 0 10*3/uL (ref 0.0–0.1)
Basophils Relative: 0.5 % (ref 0.0–3.0)
Eosinophils Absolute: 0.1 10*3/uL (ref 0.0–0.7)
Eosinophils Relative: 1.7 % (ref 0.0–5.0)
HCT: 40 % (ref 36.0–46.0)
Hemoglobin: 13 g/dL (ref 12.0–15.0)
Lymphocytes Relative: 29 % (ref 12.0–46.0)
Lymphs Abs: 2.1 10*3/uL (ref 0.7–4.0)
MCHC: 32.5 g/dL (ref 30.0–36.0)
MCV: 90.7 fl (ref 78.0–100.0)
Monocytes Absolute: 0.6 10*3/uL (ref 0.1–1.0)
Monocytes Relative: 8 % (ref 3.0–12.0)
Neutro Abs: 4.5 10*3/uL (ref 1.4–7.7)
Neutrophils Relative %: 60.8 % (ref 43.0–77.0)
Platelets: 298 10*3/uL (ref 150.0–400.0)
RBC: 4.41 Mil/uL (ref 3.87–5.11)
RDW: 14.9 % (ref 11.5–15.5)
WBC: 7.4 10*3/uL (ref 4.0–10.5)

## 2022-11-06 LAB — COMPREHENSIVE METABOLIC PANEL
ALT: 27 U/L (ref 0–35)
AST: 23 U/L (ref 0–37)
Albumin: 4.5 g/dL (ref 3.5–5.2)
Alkaline Phosphatase: 93 U/L (ref 39–117)
BUN: 8 mg/dL (ref 6–23)
CO2: 30 mEq/L (ref 19–32)
Calcium: 10.4 mg/dL (ref 8.4–10.5)
Chloride: 103 mEq/L (ref 96–112)
Creatinine, Ser: 0.69 mg/dL (ref 0.40–1.20)
GFR: 106.25 mL/min (ref >60.00)
Glucose, Bld: 94 mg/dL (ref 70–99)
Potassium: 3.7 mEq/L (ref 3.5–5.1)
Sodium: 141 mEq/L (ref 135–145)
Total Bilirubin: 0.3 mg/dL (ref 0.2–1.2)
Total Protein: 7.3 g/dL (ref 6.0–8.3)

## 2022-11-06 LAB — C-REACTIVE PROTEIN: CRP: <1 mg/dL (ref 0.5–20.0)

## 2022-11-06 LAB — VITAMIN D 25 HYDROXY (VIT D DEFICIENCY, FRACTURES): VITD: 23.87 ng/mL — ABNORMAL LOW (ref 30.00–100.00)

## 2022-11-06 LAB — SEDIMENTATION RATE: Sed Rate: 20 mm/hr (ref 0–20)

## 2022-11-06 LAB — FERRITIN: Ferritin: 32.6 ng/mL (ref 10.0–291.0)

## 2022-11-06 LAB — IBC PANEL
Iron: 44 ug/dL (ref 42–145)
Saturation Ratios: 11 % — ABNORMAL LOW (ref 20.0–50.0)
TIBC: 401.8 ug/dL (ref 250.0–450.0)
Transferrin: 287 mg/dL (ref 212.0–360.0)

## 2022-11-06 MED ORDER — ALENDRONATE SODIUM 70 MG PO TABS: 70.0000 mg | ORAL_TABLET | ORAL | 0 refills | Status: DC

## 2022-11-06 MED ORDER — MELOXICAM 15 MG PO TABS: 15.0000 mg | ORAL_TABLET | Freq: Every day | ORAL | 0 refills | Status: DC

## 2022-11-06 NOTE — Progress Notes (Signed)
[facility] Cancer Follow up:    Vanessa Moulds, MD [address] [place] [address]   DIAGNOSIS:  Cancer Staging  Malignant neoplasm of upper-inner quadrant of right breast in female, estrogen receptor positive (HCC) Staging form: Breast, AJCC 8th Edition - Clinical stage from 05/07/2022: Stage IIB (cT2, cN0, cM0, G3, ER+, PR-, HER2-) - Unsigned Stage prefix: Initial diagnosis Histologic grading system: 3 grade system Laterality: Right Staged by: Pathologist and managing physician Stage used in treatment planning: Yes National guidelines used in treatment planning: Yes Type of national guideline used in treatment planning: NCCN   SUMMARY OF ONCOLOGIC HISTORY: Oncology History  Malignant 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   04/25/2022 Pathology Results   Right breast needle core biopsy at 3:00 showed high-grade invasive ductal carcinoma.  Prognostic showed ER 40% positive weak staining intensity, PR 0% negative, group 5 HER2 negative and Ki-67 of 80%.   05/22/2022 - 08/13/2022 Chemotherapy   Patient is on Treatment Plan : BREAST Pembrolizumab (200) D1 + Carboplatin (1.5) D1,8,15 + Paclitaxel (80) D1,8,15 q21d X 4 cycles / Pembrolizumab (200) D1 + AC D1 q21d x 4 cycles      Genetic Testing   [facility] Custom Cancer Panel+RNA was Negative. Report date is 05/14/2022.  The Custom Hereditary Cancers Panel offered by [facility] includes sequencing and/or deletion duplication testing of the following 43 genes: APC, ATM, AXIN2, BAP1, BARD1, BMPR1A, BRCA1, BRCA2, BRIP1, CDH1, CDK4, CDKN2A (p14ARF and p16INK4a only), CHEK2, CTNNA1, EPCAM (Deletion/duplication testing only), FH, GREM1 (promoter region duplication testing only), HOXB13, KIT, MBD4, MEN1, MLH1,  MSH2, MSH3, MSH6, MUTYH, NF1, NHTL1, PALB2, PDGFRA, PMS2, POLD1, POLE, PTEN, RAD51C, RAD51D, SMAD4, SMARCA4. STK11, TP53, TSC1, TSC2, and VHL.   10/03/2022 Definitive Surgery   She had right breast lumpectomy which showed benign breast tissue with chronic inflammation, hemosiderin deposit and fibroblastic reaction consistent with biopsy site changes and treatment effects.  No evidence of residual carcinoma.  Right axillary sentinel lymph nodes were all negative without any evidence of residual carcinoma.     CURRENT THERAPY: preparing for surgery  INTERVAL HISTORY: Vanessa Murphy 42 y.o. female returns for follow-up of her right breast cancer.    Discussed the use of AI scribe software for clinical note transcription with the patient, who gave verbal consent to proceed.  History of Present Illness    The patient, with a history of breast cancer, presents for a post-chemotherapy follow-up. She reports that she has been recovering well from the surgery and is pleased to hear that the tumor has completely resolved. She has been informed that no further treatment is planned at this time.  The patient has a history of foot injuries, including a broken fibula, which had healed prior to the start of chemotherapy. She reports that her foot has been sore and her right hip has started hurting, causing difficulty with walking by the end of the day. She also reports intermittent aches and pains in various parts of her body.  She also reports experiencing hot flashes and is concerned about the possibility of early menopause.     Rest of the pertinent 10 point ROS reviewed and negative  Patient Active Problem List   Diagnosis Date Noted   Nausea without vomiting 08/21/2022   Port-A-Cath in place 05/22/2022  Genetic testing 05/15/2022   Family history of breast cancer 05/07/2022   Family history of melanoma 05/07/2022   Malignant neoplasm of upper-inner quadrant of right breast in female,  estrogen receptor positive (HCC) 05/06/2022   Adjustment disorder with mixed anxiety and depressed mood 03/18/2019   Anxiety 07/12/2018   Abnormal menstruation 07/23/2006   Intractable migraine 08/05/2004   DDD (degenerative disc disease), cervical 08/05/2004    is allergic to contrast media [iodinated contrast media], venlafaxine, amitriptyline, iodine, methocarbamol, and tartrazine.  MEDICAL HISTORY: Past Medical History:  Diagnosis Date   Anxiety    Cancer (HCC)    Migraines    PONV (postoperative nausea and vomiting)     SURGICAL HISTORY: Past Surgical History:  Procedure Laterality Date   BILATERAL SALPINGECTOMY     BREAST BIOPSY Right 04/25/2022   [place] RT BREAST BX W LOC DEV 1ST LESION IMG BX SPEC US GUIDE 04/25/2022 [facility]   BREAST BIOPSY Left 05/2022   BREAST BIOPSY  10/02/2022   MM RT RADIOACTIVE SEED LOC MAMMO GUIDE 10/02/2022 [facility]   BREAST LUMPECTOMY WITH RADIOACTIVE SEED AND SENTINEL LYMPH NODE BIOPSY Right 10/03/2022   Procedure: RIGHT BREAST LUMPECTOMY WITH RADIOACTIVE SEED AND SENTINEL LYMPH NODE BIOPSY;  Surgeon: Griselda Miner, MD;  Location: [facility];  Service: General;  Laterality: Right;  PEC BLOCK   IR IMAGING GUIDED PORT INSERTION  05/21/2022    SOCIAL HISTORY: Social History   Socioeconomic History   Marital status: Single    Spouse name: Not on file   Number of children: Not on file   Years of education: Not on file   Highest education level: Not on file  Occupational History   Not on file  Tobacco Use   Smoking status: Former    Current packs/day: 0.00    Average packs/day: 0.5 packs/day for 24.2 years (12.1 ttl pk-yrs)    Types: Cigarettes    Start date: 2000    Quit date: 05/07/2022    Years since quitting: 0.5    Passive exposure: Past   Smokeless tobacco: Never   Tobacco comments:    Quit 05/07/2022  Vaping Use   Vaping status: Never Used  Substance and Sexual Activity   Alcohol use: Not  Currently   Drug use: Never   Sexual activity: Yes    Birth control/protection: Surgical    Comment: MRI 05-16-22 Left Breast Lump found  Other Topics Concern   Not on file  Social History Narrative   Not on file   Social Determinants of Health   Financial Resource Strain: Medium Risk (05/07/2022)   Overall Financial Resource Strain (CARDIA)    Difficulty of Paying Living Expenses: Somewhat hard  Food Insecurity: No Food Insecurity (10/30/2022)   Hunger Vital Sign    Worried About Running Out of Food in the Last Year: Never true    Ran Out of Food in the Last Year: Never true  Transportation Needs: No Transportation Needs (10/30/2022)   PRAPARE - Administrator, Civil Service (Medical): No    Lack of Transportation (Non-Medical): No  Physical Activity: Not on file  Stress: Stress Concern Present (05/07/2022)   [employer] of Occupational Health - Occupational Stress Questionnaire    Feeling of Stress : Very much  Social Connections: Not on file  Intimate Partner Violence: Not At Risk (10/30/2022)   Humiliation, Afraid, Rape, and Kick questionnaire    Fear of Current or Ex-Partner: No    Emotionally Abused:  No    Physically Abused: No    Sexually Abused: No    FAMILY HISTORY: Family History  Problem Relation Age of Onset   Migraines Mother    Melanoma Father 34 - 77       dx. again in his early 56s   Breast cancer Paternal Grandmother 34 - 57    PHYSICAL EXAMINATION   Vitals:   11/06/22 0913  BP: 121/77  Pulse: 81  Resp: 16  Temp: 97.7 F (36.5 C)  SpO2: 100%    Physical Exam Constitutional:      General: She is not in acute distress.    Appearance: Normal appearance. She is not toxic-appearing.  HENT:     Head: Normocephalic and atraumatic.     Mouth/Throat:     Mouth: Mucous membranes are moist.     Pharynx: Oropharynx is clear. No oropharyngeal exudate or posterior oropharyngeal erythema.  Eyes:     General: No scleral  icterus. Cardiovascular:     Rate and Rhythm: Normal rate and regular rhythm.     Pulses: Normal pulses.     Heart sounds: Normal heart sounds.  Pulmonary:     Effort: Pulmonary effort is normal.     Breath sounds: Normal breath sounds.  Abdominal:     General: Abdomen is flat. Bowel sounds are normal. There is no distension.     Palpations: Abdomen is soft.     Tenderness: There is no abdominal tenderness.  Musculoskeletal:        General: No swelling.     Cervical back: Neck supple.  Lymphadenopathy:     Cervical: No cervical adenopathy.  Skin:    General: Skin is warm and dry.     Findings: No rash.  Neurological:     General: No focal deficit present.     Mental Status: She is alert.  Psychiatric:        Mood and Affect: Mood normal.        Behavior: Behavior normal.     LABORATORY DATA:  CBC    Component Value Date/Time   WBC 7.6 09/25/2022 1130   WBC 6.8 06/24/2022 1352   RBC 3.68 (L) 09/25/2022 1130   HGB 11.9 (L) 09/25/2022 1130   HCT 34.7 (L) 09/25/2022 1130   PLT 236 09/25/2022 1130   MCV 94.3 09/25/2022 1130   MCH 32.3 09/25/2022 1130   MCHC 34.3 09/25/2022 1130   RDW 14.1 09/25/2022 1130   LYMPHSABS 1.6 09/25/2022 1130   MONOABS 0.7 09/25/2022 1130   EOSABS 0.4 09/25/2022 1130   BASOSABS 0.0 09/25/2022 1130    CMP     Component Value Date/Time   NA 137 09/25/2022 1130   K 3.6 09/25/2022 1130   CL 103 09/25/2022 1130   CO2 29 09/25/2022 1130   GLUCOSE 109 (H) 09/25/2022 1130   BUN 9 09/25/2022 1130   CREATININE 0.70 09/25/2022 1130   CALCIUM 9.2 09/25/2022 1130   PROT 6.6 09/25/2022 1130   ALBUMIN 4.3 09/25/2022 1130   AST 18 09/25/2022 1130   ALT 23 09/25/2022 1130   ALKPHOS 77 09/25/2022 1130   BILITOT 0.3 09/25/2022 1130   GFRNONAA >60 09/25/2022 1130       PENDING LABS:   RADIOGRAPHIC STUDIES:  No results found.   PATHOLOGY:     ASSESSMENT and THERAPY PLAN:   Malignant neoplasm of upper-inner quadrant of right  breast in female, estrogen receptor positive (HCC) Vanessa Murphy is a 42 year old woman with stage IIb right-sided  breast cancer here today for follow-up and evaluation prior to receiving neoadjuvant Keytruda, Taxol, carbo.  Stage IIb breast cancer: She has completed her neoadjuvant chemotherapy.  She had complete pathologic response.  She is doing remarkably well except for intermittent aches and pains, her left leg pain and foot pain are the most bothersome to her.  She is now seeing her orthopedics team again.  There appears to be no evidence of swelling on exam today.  At this time since she had a very tough course during neoadjuvant chemotherapy, we have discussed about omitting adjuvant immunotherapy.  She can return to clinic after completing adjuvant radiation.  She had 40% weak ER staining on the original biopsy for estrogen receptor hence we have discussed briefly about tamoxifen.  I have also given her some printed information about tamoxifen.  She can return to clinic in early December and we will go over it 1 more time and see if she would like to try it.  Thank you for consulting [place] in the care of this patient.  Please not hesitate with any additional questions or concerns     All questions were answered. The patient knows to call the clinic with any problems, questions or concerns. We can certainly see the patient much sooner if necessary.  Total encounter time:30 minutes*in face-to-face visit time, chart review, lab review, care coordination, order entry, and documentation of the encounter time.    *Total Encounter Time as defined by the Centers for Medicare and Medicaid Services includes, in addition to the face-to-face time of a patient visit (documented in the note above) non-face-to-face time: obtaining and reviewing outside history, ordering and reviewing medications, tests or procedures, care coordination (communications with other health care professionals or caregivers) and  documentation in the medical record.

## 2022-11-06 NOTE — Assessment & Plan Note (Addendum)
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  Patient felt like a cam walker that she was given previously did not help.  Patient would like to continue to be ambulatory.  Given an Aircast to see if this would be beneficial.  Discussed with patient about different treatment options and will refer patient to orthopedic oncology to discuss different treatment options if we felt like a bone graft or a cord decompression was necessary.  In addition of that we will start with medical management including continuing the vitamin D supplementation, we discussed the low dose statins but will hold until lipid panel comes back, meloxicam prescribed and warned of potential side effects, started bisphosphonates and ordered bone density.  Will follow-up with me again in 4 weeks but will call sooner if worsening symptoms.  Due to patient's allover pain would consider the possibility of a PET scan but will check with oncology to see what they suggest.

## 2022-11-06 NOTE — Assessment & Plan Note (Signed)
Vanessa Murphy is a 42 year old woman with stage IIb right-sided breast cancer here today for follow-up and evaluation prior to receiving neoadjuvant Keytruda, Taxol, carbo.  Stage IIb breast cancer: She has completed her neoadjuvant chemotherapy.  She had complete pathologic response.  She is doing remarkably well except for intermittent aches and pains, her left leg pain and foot pain are the most bothersome to her.  She is now seeing her orthopedics team again.  There appears to be no evidence of swelling on exam today.  At this time since she had a very tough course during neoadjuvant chemotherapy, we have discussed about omitting adjuvant immunotherapy.  She can return to clinic after completing adjuvant radiation.  She had 40% weak ER staining on the original biopsy for estrogen receptor hence we have discussed briefly about tamoxifen.  I have also given her some printed information about tamoxifen.  She can return to clinic in early December and we will go over it 1 more time and see if she would like to try it.  Thank you for consulting [place] in the care of this patient.  Please not hesitate with any additional questions or concerns

## 2022-11-06 NOTE — Patient Instructions (Addendum)
Meloxicam 15mg  daily Fosamax weekly 70mg  Continue Vit D You have 14 days to return or exchange your brace Call [phone], then return the brace to our office Referral to [facility] today Bone Density See you again in 4 weeks

## 2022-11-07 ENCOUNTER — Encounter: Payer: Self-pay | Admitting: Family Medicine

## 2022-11-07 DIAGNOSIS — E559 Vitamin D deficiency, unspecified: Secondary | ICD-10-CM

## 2022-11-07 LAB — LIPID PANEL
Cholesterol: 194 mg/dL (ref <200)
HDL: 49 mg/dL — ABNORMAL LOW (ref >=50)
LDL Cholesterol (Calc): 115 mg/dL — ABNORMAL HIGH
Non-HDL Cholesterol (Calc): 145 mg/dL — ABNORMAL HIGH (ref <130)
Total CHOL/HDL Ratio: 4 (calc) (ref <5.0)
Triglycerides: 183 mg/dL — ABNORMAL HIGH (ref <150)

## 2022-11-07 LAB — PTH, INTACT AND CALCIUM
Calcium: 10.3 mg/dL — ABNORMAL HIGH (ref 8.6–10.2)
PTH: 37 pg/mL (ref 16–77)

## 2022-11-07 LAB — CALCIUM, IONIZED: Calcium, Ion: 5.4 mg/dL (ref 4.7–5.5)

## 2022-11-07 MED ORDER — VITAMIN D (ERGOCALCIFEROL) 1.25 MG (50000 UNIT) PO CAPS
50000.0000 [IU] | ORAL_CAPSULE | ORAL | 0 refills | Status: DC
Start: 2022-11-07 — End: 2023-01-12

## 2022-11-11 ENCOUNTER — Encounter: Payer: Self-pay | Admitting: Hematology and Oncology

## 2022-11-11 ENCOUNTER — Encounter: Payer: Self-pay | Admitting: Family Medicine

## 2022-11-11 ENCOUNTER — Telehealth: Payer: Self-pay

## 2022-11-11 NOTE — Telephone Encounter (Signed)
Spoke to patient informing her that her FMLA documents for Vanessa Murphy had been completed and faxed to the company. Fax conformation received. Copy of documents place up front for pick up on 11/13/22 per patient request.

## 2022-11-12 ENCOUNTER — Telehealth: Payer: Self-pay

## 2022-11-12 NOTE — Telephone Encounter (Signed)
Spoke with Patient regarding FMLA forms that were faxed to her Employer on 11/11/22.Patient stated that she received an email this morning from her Employer stating that the forms had been received. No other needs or concerns voiced at this time.

## 2022-11-12 NOTE — Telephone Encounter (Signed)
I will call her.

## 2022-11-13 ENCOUNTER — Other Ambulatory Visit: Payer: Self-pay

## 2022-11-13 ENCOUNTER — Ambulatory Visit
Admission: RE | Admit: 2022-11-13 | Discharge: 2022-11-13 | Disposition: A | Discharge status: Home / Self Care | Payer: BC Managed Care – PPO | Source: Ambulatory Visit | Attending: Family Medicine | Admitting: Family Medicine

## 2022-11-13 ENCOUNTER — Ambulatory Visit
Admission: RE | Admit: 2022-11-13 | Discharge: 2022-11-13 | Disposition: A | Discharge status: Home / Self Care | Payer: BC Managed Care – PPO | Source: Ambulatory Visit | Attending: Radiation Oncology | Admitting: Radiation Oncology

## 2022-11-13 DIAGNOSIS — Z170 Estrogen receptor positive status [ER+]: Secondary | ICD-10-CM | POA: Insufficient documentation

## 2022-11-13 DIAGNOSIS — C50211 Malignant neoplasm of upper-inner quadrant of right female breast: Secondary | ICD-10-CM | POA: Diagnosis present

## 2022-11-13 DIAGNOSIS — M879 Osteonecrosis, unspecified: Secondary | ICD-10-CM

## 2022-11-13 LAB — RAD ONC ARIA SESSION SUMMARY
Course Elapsed Days: 0
Plan Fractions Treated to Date: 1
Plan Prescribed Dose Per Fraction: 1.8 Gy
Plan Total Fractions Prescribed: 28
Plan Total Prescribed Dose: 50.4 Gy
Reference Point Dosage Given to Date: 1.8 Gy
Reference Point Session Dosage Given: 1.8 Gy
Session Number: 1

## 2022-11-14 ENCOUNTER — Ambulatory Visit
Admission: RE | Admit: 2022-11-14 | Discharge: 2022-11-14 | Disposition: A | Discharge status: Home / Self Care | Payer: BC Managed Care – PPO | Source: Ambulatory Visit | Attending: Radiation Oncology | Admitting: Radiation Oncology

## 2022-11-14 ENCOUNTER — Ambulatory Visit
Admission: RE | Admit: 2022-11-14 | Discharge: 2022-11-14 | Disposition: A | Discharge status: Home / Self Care | Payer: BC Managed Care – PPO | Source: Ambulatory Visit | Attending: Radiation Oncology

## 2022-11-14 ENCOUNTER — Other Ambulatory Visit: Payer: Self-pay

## 2022-11-14 DIAGNOSIS — C50211 Malignant neoplasm of upper-inner quadrant of right female breast: Secondary | ICD-10-CM | POA: Diagnosis not present

## 2022-11-14 LAB — RAD ONC ARIA SESSION SUMMARY
Course Elapsed Days: 1
Plan Fractions Treated to Date: 2
Plan Prescribed Dose Per Fraction: 1.8 Gy
Plan Total Fractions Prescribed: 28
Plan Total Prescribed Dose: 50.4 Gy
Reference Point Dosage Given to Date: 3.6 Gy
Reference Point Session Dosage Given: 1.8 Gy
Session Number: 2

## 2022-11-14 MED ORDER — ALRA NON-METALLIC DEODORANT (RAD-ONC)
1.0000 | Freq: Once | TOPICAL | Status: AC
  Administered 2022-11-14: 1 via TOPICAL

## 2022-11-14 MED ORDER — RADIAPLEXRX EX GEL: Freq: Once | CUTANEOUS | Status: AC

## 2022-11-17 ENCOUNTER — Other Ambulatory Visit: Payer: Self-pay

## 2022-11-17 ENCOUNTER — Ambulatory Visit
Admission: RE | Admit: 2022-11-17 | Discharge: 2022-11-17 | Disposition: A | Discharge status: Home / Self Care | Payer: BC Managed Care – PPO | Source: Ambulatory Visit | Attending: Radiation Oncology

## 2022-11-17 DIAGNOSIS — C50211 Malignant neoplasm of upper-inner quadrant of right female breast: Secondary | ICD-10-CM | POA: Diagnosis not present

## 2022-11-17 LAB — RAD ONC ARIA SESSION SUMMARY
Course Elapsed Days: 4
Plan Fractions Treated to Date: 3
Plan Prescribed Dose Per Fraction: 1.8 Gy
Plan Total Fractions Prescribed: 28
Plan Total Prescribed Dose: 50.4 Gy
Reference Point Dosage Given to Date: 5.4 Gy
Reference Point Session Dosage Given: 1.8 Gy
Session Number: 3

## 2022-11-18 ENCOUNTER — Ambulatory Visit
Admission: RE | Admit: 2022-11-18 | Discharge: 2022-11-18 | Disposition: A | Discharge status: Home / Self Care | Payer: BC Managed Care – PPO | Source: Ambulatory Visit | Attending: Radiation Oncology

## 2022-11-18 ENCOUNTER — Other Ambulatory Visit: Payer: Self-pay

## 2022-11-18 DIAGNOSIS — C50211 Malignant neoplasm of upper-inner quadrant of right female breast: Secondary | ICD-10-CM | POA: Diagnosis not present

## 2022-11-18 LAB — RAD ONC ARIA SESSION SUMMARY
Course Elapsed Days: 5
Plan Fractions Treated to Date: 4
Plan Prescribed Dose Per Fraction: 1.8 Gy
Plan Total Fractions Prescribed: 28
Plan Total Prescribed Dose: 50.4 Gy
Reference Point Dosage Given to Date: 7.2 Gy
Reference Point Session Dosage Given: 1.8 Gy
Session Number: 4

## 2022-11-19 ENCOUNTER — Encounter: Payer: Self-pay | Admitting: Hematology and Oncology

## 2022-11-19 ENCOUNTER — Other Ambulatory Visit: Payer: Self-pay

## 2022-11-19 ENCOUNTER — Ambulatory Visit
Admission: RE | Admit: 2022-11-19 | Discharge: 2022-11-19 | Disposition: A | Discharge status: Home / Self Care | Payer: BC Managed Care – PPO | Source: Ambulatory Visit | Attending: Radiation Oncology | Admitting: Radiation Oncology

## 2022-11-19 DIAGNOSIS — C50211 Malignant neoplasm of upper-inner quadrant of right female breast: Secondary | ICD-10-CM | POA: Diagnosis not present

## 2022-11-19 LAB — RAD ONC ARIA SESSION SUMMARY
Course Elapsed Days: 6
Plan Fractions Treated to Date: 5
Plan Prescribed Dose Per Fraction: 1.8 Gy
Plan Total Fractions Prescribed: 28
Plan Total Prescribed Dose: 50.4 Gy
Reference Point Dosage Given to Date: 9 Gy
Reference Point Session Dosage Given: 1.8 Gy
Session Number: 5

## 2022-11-20 ENCOUNTER — Ambulatory Visit
Admission: RE | Admit: 2022-11-20 | Discharge: 2022-11-20 | Disposition: A | Discharge status: Home / Self Care | Payer: BC Managed Care – PPO | Source: Ambulatory Visit | Attending: Radiation Oncology | Admitting: Radiation Oncology

## 2022-11-20 ENCOUNTER — Encounter: Payer: Self-pay | Admitting: General Practice

## 2022-11-20 ENCOUNTER — Other Ambulatory Visit: Payer: Self-pay

## 2022-11-20 DIAGNOSIS — C50211 Malignant neoplasm of upper-inner quadrant of right female breast: Secondary | ICD-10-CM | POA: Diagnosis not present

## 2022-11-20 LAB — RAD ONC ARIA SESSION SUMMARY
Course Elapsed Days: 7
Plan Fractions Treated to Date: 6
Plan Prescribed Dose Per Fraction: 1.8 Gy
Plan Total Fractions Prescribed: 28
Plan Total Prescribed Dose: 50.4 Gy
Reference Point Dosage Given to Date: 10.8 Gy
Reference Point Session Dosage Given: 1.8 Gy
Session Number: 6

## 2022-11-20 NOTE — Progress Notes (Signed)
[facility] Spiritual Care Note  Received referral from nursing to help with a [employer]. Described the location of the bell and made suggestions about how to use it. She is excited about bringing her significant other as as a witness for her ringing the bell and taking pictures together to mark the occasion of completing chemo treatment. Vanessa Murphy has direct Spiritual Care number, as well, in case other needs arise.   [address] [place], [place], [facility] Pager [phone] Voicemail [phone]

## 2022-11-21 ENCOUNTER — Ambulatory Visit
Admission: RE | Admit: 2022-11-21 | Discharge: 2022-11-21 | Disposition: A | Discharge status: Home / Self Care | Payer: BC Managed Care – PPO | Source: Ambulatory Visit | Attending: Radiation Oncology | Admitting: Radiation Oncology

## 2022-11-21 ENCOUNTER — Other Ambulatory Visit: Payer: Self-pay

## 2022-11-21 DIAGNOSIS — C50211 Malignant neoplasm of upper-inner quadrant of right female breast: Secondary | ICD-10-CM | POA: Diagnosis not present

## 2022-11-21 DIAGNOSIS — Z170 Estrogen receptor positive status [ER+]: Secondary | ICD-10-CM

## 2022-11-21 LAB — RAD ONC ARIA SESSION SUMMARY
Course Elapsed Days: 8
Plan Fractions Treated to Date: 7
Plan Prescribed Dose Per Fraction: 1.8 Gy
Plan Total Fractions Prescribed: 28
Plan Total Prescribed Dose: 50.4 Gy
Reference Point Dosage Given to Date: 12.6 Gy
Reference Point Session Dosage Given: 1.8 Gy
Session Number: 7

## 2022-11-21 MED ORDER — ALRA NON-METALLIC DEODORANT (RAD-ONC)
1.0000 | Freq: Once | TOPICAL | Status: AC
  Administered 2022-11-21: 1 via TOPICAL

## 2022-11-24 ENCOUNTER — Other Ambulatory Visit: Payer: Self-pay

## 2022-11-24 ENCOUNTER — Ambulatory Visit
Admission: RE | Admit: 2022-11-24 | Discharge: 2022-11-24 | Disposition: A | Discharge status: Home / Self Care | Payer: BC Managed Care – PPO | Source: Ambulatory Visit | Attending: Radiation Oncology | Admitting: Radiation Oncology

## 2022-11-24 DIAGNOSIS — C50211 Malignant neoplasm of upper-inner quadrant of right female breast: Secondary | ICD-10-CM | POA: Diagnosis not present

## 2022-11-24 LAB — RAD ONC ARIA SESSION SUMMARY
Course Elapsed Days: 11
Plan Fractions Treated to Date: 8
Plan Prescribed Dose Per Fraction: 1.8 Gy
Plan Total Fractions Prescribed: 28
Plan Total Prescribed Dose: 50.4 Gy
Reference Point Dosage Given to Date: 14.4 Gy
Reference Point Session Dosage Given: 1.8 Gy
Session Number: 8

## 2022-11-25 ENCOUNTER — Other Ambulatory Visit: Payer: Self-pay

## 2022-11-25 ENCOUNTER — Ambulatory Visit
Admission: RE | Admit: 2022-11-25 | Discharge: 2022-11-25 | Disposition: A | Discharge status: Home / Self Care | Payer: BC Managed Care – PPO | Source: Ambulatory Visit | Attending: Radiation Oncology

## 2022-11-25 ENCOUNTER — Encounter: Payer: Self-pay | Admitting: Family Medicine

## 2022-11-25 DIAGNOSIS — C50211 Malignant neoplasm of upper-inner quadrant of right female breast: Secondary | ICD-10-CM | POA: Diagnosis not present

## 2022-11-25 LAB — RAD ONC ARIA SESSION SUMMARY
Course Elapsed Days: 12
Plan Fractions Treated to Date: 9
Plan Prescribed Dose Per Fraction: 1.8 Gy
Plan Total Fractions Prescribed: 28
Plan Total Prescribed Dose: 50.4 Gy
Reference Point Dosage Given to Date: 16.2 Gy
Reference Point Session Dosage Given: 1.8 Gy
Session Number: 9

## 2022-11-26 ENCOUNTER — Other Ambulatory Visit: Payer: Self-pay

## 2022-11-26 ENCOUNTER — Ambulatory Visit
Admission: RE | Admit: 2022-11-26 | Discharge: 2022-11-26 | Disposition: A | Discharge status: Home / Self Care | Payer: BC Managed Care – PPO | Source: Ambulatory Visit | Attending: Radiation Oncology

## 2022-11-26 DIAGNOSIS — C50211 Malignant neoplasm of upper-inner quadrant of right female breast: Secondary | ICD-10-CM | POA: Diagnosis not present

## 2022-11-26 LAB — RAD ONC ARIA SESSION SUMMARY
Course Elapsed Days: 13
Plan Fractions Treated to Date: 10
Plan Prescribed Dose Per Fraction: 1.8 Gy
Plan Total Fractions Prescribed: 28
Plan Total Prescribed Dose: 50.4 Gy
Reference Point Dosage Given to Date: 18 Gy
Reference Point Session Dosage Given: 1.8 Gy
Session Number: 10

## 2022-11-27 ENCOUNTER — Ambulatory Visit
Admission: RE | Admit: 2022-11-27 | Discharge: 2022-11-27 | Disposition: A | Discharge status: Home / Self Care | Payer: BC Managed Care – PPO | Source: Ambulatory Visit | Attending: Radiation Oncology | Admitting: Radiation Oncology

## 2022-11-27 ENCOUNTER — Other Ambulatory Visit: Payer: Self-pay

## 2022-11-27 DIAGNOSIS — C50211 Malignant neoplasm of upper-inner quadrant of right female breast: Secondary | ICD-10-CM | POA: Diagnosis not present

## 2022-11-27 DIAGNOSIS — M879 Osteonecrosis, unspecified: Secondary | ICD-10-CM | POA: Diagnosis not present

## 2022-11-27 LAB — RAD ONC ARIA SESSION SUMMARY
Course Elapsed Days: 14
Plan Fractions Treated to Date: 11
Plan Prescribed Dose Per Fraction: 1.8 Gy
Plan Total Fractions Prescribed: 28
Plan Total Prescribed Dose: 50.4 Gy
Reference Point Dosage Given to Date: 19.8 Gy
Reference Point Session Dosage Given: 1.8 Gy
Session Number: 11

## 2022-11-28 ENCOUNTER — Ambulatory Visit
Admission: RE | Admit: 2022-11-28 | Discharge: 2022-11-28 | Disposition: A | Discharge status: Home / Self Care | Payer: BC Managed Care – PPO | Source: Ambulatory Visit | Attending: Radiation Oncology

## 2022-11-28 ENCOUNTER — Ambulatory Visit
Admission: RE | Admit: 2022-11-28 | Discharge: 2022-11-28 | Disposition: A | Discharge status: Home / Self Care | Payer: BC Managed Care – PPO | Source: Ambulatory Visit | Attending: Radiation Oncology | Admitting: Radiation Oncology

## 2022-11-28 ENCOUNTER — Other Ambulatory Visit: Payer: Self-pay

## 2022-11-28 DIAGNOSIS — C50211 Malignant neoplasm of upper-inner quadrant of right female breast: Secondary | ICD-10-CM | POA: Diagnosis not present

## 2022-11-28 LAB — RAD ONC ARIA SESSION SUMMARY
Course Elapsed Days: 15
Plan Fractions Treated to Date: 12
Plan Prescribed Dose Per Fraction: 1.8 Gy
Plan Total Fractions Prescribed: 28
Plan Total Prescribed Dose: 50.4 Gy
Reference Point Dosage Given to Date: 21.6 Gy
Reference Point Session Dosage Given: 1.8 Gy
Session Number: 12

## 2022-11-28 MED ORDER — RADIAPLEXRX EX GEL: Freq: Once | CUTANEOUS | Status: AC

## 2022-12-01 ENCOUNTER — Other Ambulatory Visit: Payer: Self-pay | Admitting: Rheumatology

## 2022-12-01 ENCOUNTER — Telehealth: Payer: Self-pay | Admitting: Family Medicine

## 2022-12-01 ENCOUNTER — Ambulatory Visit
Admission: RE | Admit: 2022-12-01 | Discharge: 2022-12-01 | Disposition: A | Discharge status: Home / Self Care | Payer: BC Managed Care – PPO | Source: Ambulatory Visit | Attending: Radiation Oncology

## 2022-12-01 ENCOUNTER — Other Ambulatory Visit: Payer: Self-pay

## 2022-12-01 DIAGNOSIS — C50211 Malignant neoplasm of upper-inner quadrant of right female breast: Secondary | ICD-10-CM | POA: Diagnosis not present

## 2022-12-01 DIAGNOSIS — E559 Vitamin D deficiency, unspecified: Secondary | ICD-10-CM

## 2022-12-01 LAB — RAD ONC ARIA SESSION SUMMARY
Course Elapsed Days: 18
Plan Fractions Treated to Date: 13
Plan Prescribed Dose Per Fraction: 1.8 Gy
Plan Total Fractions Prescribed: 28
Plan Total Prescribed Dose: 50.4 Gy
Reference Point Dosage Given to Date: 23.4 Gy
Reference Point Session Dosage Given: 1.8 Gy
Session Number: 13

## 2022-12-01 NOTE — Telephone Encounter (Signed)
Sent patient MyChart message.

## 2022-12-01 NOTE — Telephone Encounter (Signed)
Patient called stating that she has a follow up appointment with Dr Katrinka Blazing on 10/29. She is seeing the doctor that we referred her to so she asked if she needed to keep the appointment with Dr Katrinka Blazing as well?  Please advise.

## 2022-12-02 ENCOUNTER — Ambulatory Visit
Admission: RE | Admit: 2022-12-02 | Discharge: 2022-12-02 | Disposition: A | Discharge status: Home / Self Care | Payer: BC Managed Care – PPO | Source: Ambulatory Visit | Attending: Radiation Oncology

## 2022-12-02 ENCOUNTER — Other Ambulatory Visit: Payer: Self-pay

## 2022-12-02 DIAGNOSIS — C50211 Malignant neoplasm of upper-inner quadrant of right female breast: Secondary | ICD-10-CM | POA: Diagnosis not present

## 2022-12-02 LAB — RAD ONC ARIA SESSION SUMMARY
Course Elapsed Days: 19
Plan Fractions Treated to Date: 14
Plan Prescribed Dose Per Fraction: 1.8 Gy
Plan Total Fractions Prescribed: 28
Plan Total Prescribed Dose: 50.4 Gy
Reference Point Dosage Given to Date: 25.2 Gy
Reference Point Session Dosage Given: 1.8 Gy
Session Number: 14

## 2022-12-03 ENCOUNTER — Other Ambulatory Visit: Payer: Self-pay

## 2022-12-03 ENCOUNTER — Other Ambulatory Visit: Payer: Self-pay | Admitting: Family Medicine

## 2022-12-03 ENCOUNTER — Ambulatory Visit
Admission: RE | Admit: 2022-12-03 | Discharge: 2022-12-03 | Disposition: A | Discharge status: Home / Self Care | Payer: BC Managed Care – PPO | Source: Ambulatory Visit | Attending: Radiation Oncology

## 2022-12-03 DIAGNOSIS — C50211 Malignant neoplasm of upper-inner quadrant of right female breast: Secondary | ICD-10-CM | POA: Diagnosis not present

## 2022-12-03 LAB — RAD ONC ARIA SESSION SUMMARY
Course Elapsed Days: 20
Plan Fractions Treated to Date: 15
Plan Prescribed Dose Per Fraction: 1.8 Gy
Plan Total Fractions Prescribed: 28
Plan Total Prescribed Dose: 50.4 Gy
Reference Point Dosage Given to Date: 27 Gy
Reference Point Session Dosage Given: 1.8 Gy
Session Number: 15

## 2022-12-04 ENCOUNTER — Other Ambulatory Visit: Payer: Self-pay

## 2022-12-04 ENCOUNTER — Ambulatory Visit
Admission: RE | Admit: 2022-12-04 | Discharge: 2022-12-04 | Disposition: A | Discharge status: Home / Self Care | Payer: BC Managed Care – PPO | Source: Ambulatory Visit | Attending: Radiation Oncology | Admitting: Radiation Oncology

## 2022-12-04 DIAGNOSIS — C50211 Malignant neoplasm of upper-inner quadrant of right female breast: Secondary | ICD-10-CM | POA: Diagnosis not present

## 2022-12-04 LAB — RAD ONC ARIA SESSION SUMMARY
Course Elapsed Days: 21
Plan Fractions Treated to Date: 16
Plan Prescribed Dose Per Fraction: 1.8 Gy
Plan Total Fractions Prescribed: 28
Plan Total Prescribed Dose: 50.4 Gy
Reference Point Dosage Given to Date: 28.8 Gy
Reference Point Session Dosage Given: 1.8 Gy
Session Number: 16

## 2022-12-05 ENCOUNTER — Ambulatory Visit
Admission: RE | Admit: 2022-12-05 | Discharge: 2022-12-05 | Disposition: A | Discharge status: Home / Self Care | Payer: BC Managed Care – PPO | Source: Ambulatory Visit | Attending: Radiation Oncology

## 2022-12-05 ENCOUNTER — Other Ambulatory Visit: Payer: Self-pay

## 2022-12-05 ENCOUNTER — Ambulatory Visit
Admission: RE | Admit: 2022-12-05 | Discharge: 2022-12-05 | Disposition: A | Discharge status: Home / Self Care | Payer: BC Managed Care – PPO | Source: Ambulatory Visit | Attending: Radiation Oncology | Admitting: Radiation Oncology

## 2022-12-05 DIAGNOSIS — C50211 Malignant neoplasm of upper-inner quadrant of right female breast: Secondary | ICD-10-CM | POA: Diagnosis not present

## 2022-12-05 LAB — RAD ONC ARIA SESSION SUMMARY
Course Elapsed Days: 22
Plan Fractions Treated to Date: 17
Plan Prescribed Dose Per Fraction: 1.8 Gy
Plan Total Fractions Prescribed: 28
Plan Total Prescribed Dose: 50.4 Gy
Reference Point Dosage Given to Date: 30.6 Gy
Reference Point Session Dosage Given: 1.8 Gy
Session Number: 17

## 2022-12-08 ENCOUNTER — Other Ambulatory Visit: Payer: Self-pay

## 2022-12-08 ENCOUNTER — Ambulatory Visit
Admission: RE | Admit: 2022-12-08 | Discharge: 2022-12-08 | Disposition: A | Discharge status: Home / Self Care | Payer: BC Managed Care – PPO | Source: Ambulatory Visit | Attending: Radiation Oncology | Admitting: Radiation Oncology

## 2022-12-08 DIAGNOSIS — C50211 Malignant neoplasm of upper-inner quadrant of right female breast: Secondary | ICD-10-CM | POA: Diagnosis not present

## 2022-12-08 DIAGNOSIS — M1611 Unilateral primary osteoarthritis, right hip: Secondary | ICD-10-CM | POA: Insufficient documentation

## 2022-12-08 LAB — RAD ONC ARIA SESSION SUMMARY
Course Elapsed Days: 25
Plan Fractions Treated to Date: 18
Plan Prescribed Dose Per Fraction: 1.8 Gy
Plan Total Fractions Prescribed: 28
Plan Total Prescribed Dose: 50.4 Gy
Reference Point Dosage Given to Date: 32.4 Gy
Reference Point Session Dosage Given: 1.8 Gy
Session Number: 18

## 2022-12-09 ENCOUNTER — Ambulatory Visit
Admission: RE | Admit: 2022-12-09 | Discharge: 2022-12-09 | Disposition: A | Discharge status: Home / Self Care | Payer: BC Managed Care – PPO | Source: Ambulatory Visit | Attending: Radiation Oncology | Admitting: Radiation Oncology

## 2022-12-09 ENCOUNTER — Other Ambulatory Visit: Payer: Self-pay

## 2022-12-09 ENCOUNTER — Ambulatory Visit: Payer: BC Managed Care – PPO | Admitting: Family Medicine

## 2022-12-09 DIAGNOSIS — C50211 Malignant neoplasm of upper-inner quadrant of right female breast: Secondary | ICD-10-CM | POA: Diagnosis not present

## 2022-12-09 LAB — RAD ONC ARIA SESSION SUMMARY
Course Elapsed Days: 26
Plan Fractions Treated to Date: 19
Plan Prescribed Dose Per Fraction: 1.8 Gy
Plan Total Fractions Prescribed: 28
Plan Total Prescribed Dose: 50.4 Gy
Reference Point Dosage Given to Date: 34.2 Gy
Reference Point Session Dosage Given: 1.8 Gy
Session Number: 19

## 2022-12-10 ENCOUNTER — Other Ambulatory Visit: Payer: Self-pay

## 2022-12-10 ENCOUNTER — Ambulatory Visit: Payer: BC Managed Care – PPO | Admitting: Rheumatology

## 2022-12-10 ENCOUNTER — Ambulatory Visit
Admission: RE | Admit: 2022-12-10 | Discharge: 2022-12-10 | Disposition: A | Discharge status: Home / Self Care | Payer: BC Managed Care – PPO | Source: Ambulatory Visit | Attending: Radiation Oncology | Admitting: Radiation Oncology

## 2022-12-10 DIAGNOSIS — C50211 Malignant neoplasm of upper-inner quadrant of right female breast: Secondary | ICD-10-CM | POA: Diagnosis not present

## 2022-12-10 LAB — RAD ONC ARIA SESSION SUMMARY
Course Elapsed Days: 27
Plan Fractions Treated to Date: 20
Plan Prescribed Dose Per Fraction: 1.8 Gy
Plan Total Fractions Prescribed: 28
Plan Total Prescribed Dose: 50.4 Gy
Reference Point Dosage Given to Date: 36 Gy
Reference Point Session Dosage Given: 1.8 Gy
Session Number: 20

## 2022-12-11 ENCOUNTER — Other Ambulatory Visit: Payer: Self-pay

## 2022-12-11 ENCOUNTER — Ambulatory Visit
Admission: RE | Admit: 2022-12-11 | Discharge: 2022-12-11 | Disposition: A | Discharge status: Home / Self Care | Payer: BC Managed Care – PPO | Source: Ambulatory Visit | Attending: Radiation Oncology | Admitting: Radiation Oncology

## 2022-12-11 DIAGNOSIS — C50211 Malignant neoplasm of upper-inner quadrant of right female breast: Secondary | ICD-10-CM | POA: Diagnosis not present

## 2022-12-11 LAB — RAD ONC ARIA SESSION SUMMARY
Course Elapsed Days: 28
Plan Fractions Treated to Date: 21
Plan Prescribed Dose Per Fraction: 1.8 Gy
Plan Total Fractions Prescribed: 28
Plan Total Prescribed Dose: 50.4 Gy
Reference Point Dosage Given to Date: 37.8 Gy
Reference Point Session Dosage Given: 1.8 Gy
Session Number: 21

## 2022-12-12 ENCOUNTER — Ambulatory Visit
Admission: RE | Admit: 2022-12-12 | Discharge: 2022-12-12 | Disposition: A | Discharge status: Home / Self Care | Payer: BC Managed Care – PPO | Source: Ambulatory Visit | Attending: Radiation Oncology

## 2022-12-12 ENCOUNTER — Other Ambulatory Visit: Payer: Self-pay

## 2022-12-12 ENCOUNTER — Ambulatory Visit
Admission: RE | Admit: 2022-12-12 | Discharge: 2022-12-12 | Disposition: A | Discharge status: Home / Self Care | Payer: BC Managed Care – PPO | Source: Ambulatory Visit | Attending: Radiation Oncology | Admitting: Radiation Oncology

## 2022-12-12 DIAGNOSIS — C50211 Malignant neoplasm of upper-inner quadrant of right female breast: Secondary | ICD-10-CM | POA: Insufficient documentation

## 2022-12-12 DIAGNOSIS — Z170 Estrogen receptor positive status [ER+]: Secondary | ICD-10-CM | POA: Diagnosis present

## 2022-12-12 LAB — RAD ONC ARIA SESSION SUMMARY
Course Elapsed Days: 29
Plan Fractions Treated to Date: 22
Plan Prescribed Dose Per Fraction: 1.8 Gy
Plan Total Fractions Prescribed: 28
Plan Total Prescribed Dose: 50.4 Gy
Reference Point Dosage Given to Date: 39.6 Gy
Reference Point Session Dosage Given: 1.8 Gy
Session Number: 22

## 2022-12-15 ENCOUNTER — Ambulatory Visit
Admission: RE | Admit: 2022-12-15 | Discharge: 2022-12-15 | Disposition: A | Discharge status: Home / Self Care | Payer: BC Managed Care – PPO | Source: Ambulatory Visit | Attending: Radiation Oncology

## 2022-12-15 ENCOUNTER — Other Ambulatory Visit: Payer: Self-pay

## 2022-12-15 DIAGNOSIS — C50211 Malignant neoplasm of upper-inner quadrant of right female breast: Secondary | ICD-10-CM | POA: Diagnosis not present

## 2022-12-15 LAB — RAD ONC ARIA SESSION SUMMARY
Course Elapsed Days: 32
Plan Fractions Treated to Date: 23
Plan Prescribed Dose Per Fraction: 1.8 Gy
Plan Total Fractions Prescribed: 28
Plan Total Prescribed Dose: 50.4 Gy
Reference Point Dosage Given to Date: 41.4 Gy
Reference Point Session Dosage Given: 1.8 Gy
Session Number: 23

## 2022-12-16 ENCOUNTER — Other Ambulatory Visit: Payer: Self-pay

## 2022-12-16 ENCOUNTER — Ambulatory Visit
Admission: RE | Admit: 2022-12-16 | Discharge: 2022-12-16 | Disposition: A | Discharge status: Home / Self Care | Payer: BC Managed Care – PPO | Source: Ambulatory Visit | Attending: Radiation Oncology

## 2022-12-16 DIAGNOSIS — C50211 Malignant neoplasm of upper-inner quadrant of right female breast: Secondary | ICD-10-CM | POA: Diagnosis not present

## 2022-12-16 LAB — RAD ONC ARIA SESSION SUMMARY
Course Elapsed Days: 33
Plan Fractions Treated to Date: 24
Plan Prescribed Dose Per Fraction: 1.8 Gy
Plan Total Fractions Prescribed: 28
Plan Total Prescribed Dose: 50.4 Gy
Reference Point Dosage Given to Date: 43.2 Gy
Reference Point Session Dosage Given: 1.8 Gy
Session Number: 24

## 2022-12-17 ENCOUNTER — Ambulatory Visit
Admission: RE | Admit: 2022-12-17 | Discharge: 2022-12-17 | Disposition: A | Discharge status: Home / Self Care | Payer: BC Managed Care – PPO | Source: Ambulatory Visit | Attending: Radiation Oncology | Admitting: Radiation Oncology

## 2022-12-17 ENCOUNTER — Other Ambulatory Visit: Payer: Self-pay

## 2022-12-17 DIAGNOSIS — C50211 Malignant neoplasm of upper-inner quadrant of right female breast: Secondary | ICD-10-CM | POA: Diagnosis not present

## 2022-12-17 LAB — RAD ONC ARIA SESSION SUMMARY
Course Elapsed Days: 34
Plan Fractions Treated to Date: 25
Plan Prescribed Dose Per Fraction: 1.8 Gy
Plan Total Fractions Prescribed: 28
Plan Total Prescribed Dose: 50.4 Gy
Reference Point Dosage Given to Date: 45 Gy
Reference Point Session Dosage Given: 1.8 Gy
Session Number: 25

## 2022-12-18 ENCOUNTER — Other Ambulatory Visit: Payer: Self-pay

## 2022-12-18 ENCOUNTER — Ambulatory Visit
Admission: RE | Admit: 2022-12-18 | Discharge: 2022-12-18 | Disposition: A | Discharge status: Home / Self Care | Payer: BC Managed Care – PPO | Source: Ambulatory Visit | Attending: Radiation Oncology | Admitting: Radiation Oncology

## 2022-12-18 DIAGNOSIS — C50211 Malignant neoplasm of upper-inner quadrant of right female breast: Secondary | ICD-10-CM | POA: Diagnosis not present

## 2022-12-18 LAB — RAD ONC ARIA SESSION SUMMARY
Course Elapsed Days: 35
Plan Fractions Treated to Date: 26
Plan Prescribed Dose Per Fraction: 1.8 Gy
Plan Total Fractions Prescribed: 28
Plan Total Prescribed Dose: 50.4 Gy
Reference Point Dosage Given to Date: 46.8 Gy
Reference Point Session Dosage Given: 1.8 Gy
Session Number: 26

## 2022-12-19 ENCOUNTER — Ambulatory Visit
Admission: RE | Admit: 2022-12-19 | Discharge: 2022-12-19 | Disposition: A | Discharge status: Home / Self Care | Payer: BC Managed Care – PPO | Source: Ambulatory Visit | Attending: Radiation Oncology

## 2022-12-19 ENCOUNTER — Other Ambulatory Visit: Payer: Self-pay

## 2022-12-19 ENCOUNTER — Ambulatory Visit: Payer: BC Managed Care – PPO | Admitting: Radiation Oncology

## 2022-12-19 DIAGNOSIS — C50211 Malignant neoplasm of upper-inner quadrant of right female breast: Secondary | ICD-10-CM | POA: Diagnosis not present

## 2022-12-19 LAB — RAD ONC ARIA SESSION SUMMARY
Course Elapsed Days: 36
Plan Fractions Treated to Date: 27
Plan Prescribed Dose Per Fraction: 1.8 Gy
Plan Total Fractions Prescribed: 28
Plan Total Prescribed Dose: 50.4 Gy
Reference Point Dosage Given to Date: 48.6 Gy
Reference Point Session Dosage Given: 1.8 Gy
Session Number: 27

## 2022-12-22 ENCOUNTER — Other Ambulatory Visit: Payer: Self-pay

## 2022-12-22 ENCOUNTER — Ambulatory Visit
Admission: RE | Admit: 2022-12-22 | Discharge: 2022-12-22 | Disposition: A | Discharge status: Home / Self Care | Payer: BC Managed Care – PPO | Source: Ambulatory Visit | Attending: Radiation Oncology

## 2022-12-22 DIAGNOSIS — C50211 Malignant neoplasm of upper-inner quadrant of right female breast: Secondary | ICD-10-CM | POA: Diagnosis not present

## 2022-12-22 LAB — RAD ONC ARIA SESSION SUMMARY
Course Elapsed Days: 39
Plan Fractions Treated to Date: 28
Plan Prescribed Dose Per Fraction: 1.8 Gy
Plan Total Fractions Prescribed: 28
Plan Total Prescribed Dose: 50.4 Gy
Reference Point Dosage Given to Date: 50.4 Gy
Reference Point Session Dosage Given: 1.8 Gy
Session Number: 28

## 2022-12-23 ENCOUNTER — Ambulatory Visit
Admission: RE | Admit: 2022-12-23 | Discharge: 2022-12-23 | Disposition: A | Discharge status: Home / Self Care | Payer: BC Managed Care – PPO | Source: Ambulatory Visit | Attending: Radiation Oncology | Admitting: Radiation Oncology

## 2022-12-23 ENCOUNTER — Other Ambulatory Visit: Payer: Self-pay

## 2022-12-23 DIAGNOSIS — C50211 Malignant neoplasm of upper-inner quadrant of right female breast: Secondary | ICD-10-CM | POA: Diagnosis not present

## 2022-12-23 LAB — RAD ONC ARIA SESSION SUMMARY
Course Elapsed Days: 40
Plan Fractions Treated to Date: 1
Plan Prescribed Dose Per Fraction: 2 Gy
Plan Total Fractions Prescribed: 5
Plan Total Prescribed Dose: 10 Gy
Reference Point Dosage Given to Date: 2 Gy
Reference Point Session Dosage Given: 2 Gy
Session Number: 29

## 2022-12-24 ENCOUNTER — Other Ambulatory Visit: Payer: Self-pay

## 2022-12-24 ENCOUNTER — Ambulatory Visit
Admission: RE | Admit: 2022-12-24 | Discharge: 2022-12-24 | Disposition: A | Discharge status: Home / Self Care | Payer: BC Managed Care – PPO | Source: Ambulatory Visit | Attending: Radiation Oncology

## 2022-12-24 DIAGNOSIS — C50211 Malignant neoplasm of upper-inner quadrant of right female breast: Secondary | ICD-10-CM | POA: Diagnosis not present

## 2022-12-24 LAB — RAD ONC ARIA SESSION SUMMARY
Course Elapsed Days: 41
Plan Fractions Treated to Date: 2
Plan Prescribed Dose Per Fraction: 2 Gy
Plan Total Fractions Prescribed: 5
Plan Total Prescribed Dose: 10 Gy
Reference Point Dosage Given to Date: 4 Gy
Reference Point Session Dosage Given: 2 Gy
Session Number: 30

## 2022-12-25 ENCOUNTER — Ambulatory Visit
Admission: RE | Admit: 2022-12-25 | Discharge: 2022-12-25 | Disposition: A | Discharge status: Home / Self Care | Payer: BC Managed Care – PPO | Source: Ambulatory Visit | Attending: Radiation Oncology | Admitting: Radiation Oncology

## 2022-12-25 ENCOUNTER — Other Ambulatory Visit: Payer: Self-pay

## 2022-12-25 DIAGNOSIS — C50211 Malignant neoplasm of upper-inner quadrant of right female breast: Secondary | ICD-10-CM | POA: Diagnosis not present

## 2022-12-25 LAB — RAD ONC ARIA SESSION SUMMARY
Course Elapsed Days: 42
Plan Fractions Treated to Date: 3
Plan Prescribed Dose Per Fraction: 2 Gy
Plan Total Fractions Prescribed: 5
Plan Total Prescribed Dose: 10 Gy
Reference Point Dosage Given to Date: 6 Gy
Reference Point Session Dosage Given: 2 Gy
Session Number: 31

## 2022-12-26 ENCOUNTER — Other Ambulatory Visit: Payer: Self-pay

## 2022-12-26 ENCOUNTER — Ambulatory Visit
Admission: RE | Admit: 2022-12-26 | Discharge: 2022-12-26 | Disposition: A | Discharge status: Home / Self Care | Payer: BC Managed Care – PPO | Source: Ambulatory Visit | Attending: Radiation Oncology

## 2022-12-26 DIAGNOSIS — C50211 Malignant neoplasm of upper-inner quadrant of right female breast: Secondary | ICD-10-CM | POA: Diagnosis not present

## 2022-12-26 LAB — RAD ONC ARIA SESSION SUMMARY
Course Elapsed Days: 43
Plan Fractions Treated to Date: 4
Plan Prescribed Dose Per Fraction: 2 Gy
Plan Total Fractions Prescribed: 5
Plan Total Prescribed Dose: 10 Gy
Reference Point Dosage Given to Date: 8 Gy
Reference Point Session Dosage Given: 2 Gy
Session Number: 32

## 2022-12-29 ENCOUNTER — Other Ambulatory Visit: Payer: Self-pay

## 2022-12-29 ENCOUNTER — Ambulatory Visit
Admission: RE | Admit: 2022-12-29 | Discharge: 2022-12-29 | Disposition: A | Discharge status: Home / Self Care | Payer: BC Managed Care – PPO | Source: Ambulatory Visit | Attending: Radiation Oncology

## 2022-12-29 DIAGNOSIS — C50211 Malignant neoplasm of upper-inner quadrant of right female breast: Secondary | ICD-10-CM | POA: Diagnosis not present

## 2022-12-29 LAB — RAD ONC ARIA SESSION SUMMARY
Course Elapsed Days: 46
Plan Fractions Treated to Date: 5
Plan Prescribed Dose Per Fraction: 2 Gy
Plan Total Fractions Prescribed: 5
Plan Total Prescribed Dose: 10 Gy
Reference Point Dosage Given to Date: 10 Gy
Reference Point Session Dosage Given: 2 Gy
Session Number: 33

## 2022-12-30 NOTE — Radiation Completion Notes (Addendum)
  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   Intent: Curative     ==========DELIVERED PLANS==========  First Treatment Date: 2022-11-13 - Last Treatment Date: 2022-12-29   Plan Name: Breast_R Site: Breast, Right Technique: 3D Mode: Photon Dose Per Fraction: 1.8 Gy Prescribed Dose (Delivered / Prescribed): 50.4 Gy / 50.4 Gy Prescribed Fxs (Delivered / Prescribed): 28 / 28   Plan Name: Breast_R_Bst Site: Breast, Right Technique: 3D Mode: Photon Dose Per Fraction: 2 Gy Prescribed Dose (Delivered / Prescribed): 10 Gy / 10 Gy Prescribed Fxs (Delivered / Prescribed): 5 / 5     ==========ON TREATMENT VISIT DATES========== 2022-11-14, 2022-11-21, 2022-11-28, 2022-12-05, 2022-12-12, 2022-12-19, 2022-12-26   See weekly On Treatment Notes in Epic for details. The patient tolerated radiation. She developed fatigue and anticipated skin changes in the treatment field. At the conclusion she had dry desquamation in the treatment field.  The patient will receive a call in about one month from the radiation oncology department. She will continue follow up with Dr. Al Pimple as well.      Osker Mason, PAC

## 2023-01-01 ENCOUNTER — Other Ambulatory Visit: Payer: Self-pay | Admitting: Family Medicine

## 2023-01-05 ENCOUNTER — Ambulatory Visit: Payer: BC Managed Care – PPO | Attending: General Surgery

## 2023-01-05 DIAGNOSIS — Z170 Estrogen receptor positive status [ER+]: Secondary | ICD-10-CM | POA: Insufficient documentation

## 2023-01-05 DIAGNOSIS — R293 Abnormal posture: Secondary | ICD-10-CM | POA: Insufficient documentation

## 2023-01-05 DIAGNOSIS — C50211 Malignant neoplasm of upper-inner quadrant of right female breast: Secondary | ICD-10-CM | POA: Insufficient documentation

## 2023-01-12 ENCOUNTER — Other Ambulatory Visit: Payer: Self-pay | Admitting: Family Medicine

## 2023-01-12 ENCOUNTER — Encounter: Payer: Self-pay | Admitting: Hematology and Oncology

## 2023-01-12 DIAGNOSIS — E559 Vitamin D deficiency, unspecified: Secondary | ICD-10-CM

## 2023-01-19 ENCOUNTER — Other Ambulatory Visit: Payer: Self-pay | Admitting: Hematology and Oncology

## 2023-01-19 ENCOUNTER — Inpatient Hospital Stay: Payer: BC Managed Care – PPO | Attending: Hematology and Oncology | Admitting: Hematology and Oncology

## 2023-01-19 ENCOUNTER — Encounter: Payer: Self-pay | Admitting: *Deleted

## 2023-01-19 DIAGNOSIS — C50211 Malignant neoplasm of upper-inner quadrant of right female breast: Secondary | ICD-10-CM

## 2023-01-19 DIAGNOSIS — Z170 Estrogen receptor positive status [ER+]: Secondary | ICD-10-CM | POA: Diagnosis not present

## 2023-01-19 NOTE — Assessment & Plan Note (Signed)
Vanessa Murphy is a 42 year old woman with stage IIb right-sided breast cancer here today for follow-up and evaluation after completing chemo and radiation.  Stage IIb breast cancer: She has completed her neoadjuvant chemotherapy.  She had complete pathologic response.   She had a very tough course with the neoadjuvant chemoimmunotherapy hence we have decided to omit adjuvant immunotherapy especially since she had complete pathologic response. She completed adjuvant radiation. 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  We have today discussed about role of antiestrogen therapy since she had ER 40% weak staining on the initial biopsy but she is very reluctant to do any more treatment given the complication she had.  She however requested [place] to send an in basket message with the name of the drug so she can do her research.  She was encouraged to call us if she decides to try it.  If she decides to not try it, we will see her back in about 6 months.  I have briefly reviewed the mechanism of action of tamoxifen, adverse effects of tamoxifen including risk of DVT/PE.

## 2023-01-19 NOTE — Progress Notes (Signed)
[facility] Cancer Follow up:    Vanessa Moulds, MD [address] [place] [address]   DIAGNOSIS:  Cancer Staging  Malignant neoplasm of upper-inner quadrant of right breast in female, estrogen receptor positive (HCC) Staging form: Breast, AJCC 8th Edition - Clinical stage from 05/07/2022: Stage IIB (cT2, cN0, cM0, G3, ER+, PR-, HER2-) - Unsigned Stage prefix: Initial diagnosis Histologic grading system: 3 grade system Laterality: Right Staged by: Pathologist and managing physician Stage used in treatment planning: Yes National guidelines used in treatment planning: Yes Type of national guideline used in treatment planning: NCCN   SUMMARY OF ONCOLOGIC HISTORY: Oncology History  Malignant 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   04/25/2022 Pathology Results   Right breast needle core biopsy at 3:00 showed high-grade invasive ductal carcinoma.  Prognostic showed ER 40% positive weak staining intensity, PR 0% negative, group 5 HER2 negative and Ki-67 of 80%.   05/22/2022 - 08/13/2022 Chemotherapy   Patient is on Treatment Plan : BREAST Pembrolizumab (200) D1 + Carboplatin (1.5) D1,8,15 + Paclitaxel (80) D1,8,15 q21d X 4 cycles / Pembrolizumab (200) D1 + AC D1 q21d x 4 cycles      Genetic Testing   [facility] Custom Cancer Panel+RNA was Negative. Report date is 05/14/2022.  The Custom Hereditary Cancers Panel offered by [facility] includes sequencing and/or deletion duplication testing of the following 43 genes: APC, ATM, AXIN2, BAP1, BARD1, BMPR1A, BRCA1, BRCA2, BRIP1, CDH1, CDK4, CDKN2A (p14ARF and p16INK4a only), CHEK2, CTNNA1, EPCAM (Deletion/duplication testing only), FH, GREM1 (promoter region duplication testing only), HOXB13, KIT, MBD4, MEN1, MLH1,  MSH2, MSH3, MSH6, MUTYH, NF1, NHTL1, PALB2, PDGFRA, PMS2, POLD1, POLE, PTEN, RAD51C, RAD51D, SMAD4, SMARCA4. STK11, TP53, TSC1, TSC2, and VHL.   10/03/2022 Definitive Surgery   She had right breast lumpectomy which showed benign breast tissue with chronic inflammation, hemosiderin deposit and fibroblastic reaction consistent with biopsy site changes and treatment effects.  No evidence of residual carcinoma.  Right axillary sentinel lymph nodes were all negative without any evidence of residual carcinoma.     CURRENT THERAPY: preparing for surgery  INTERVAL HISTORY:  Vanessa Murphy 42 y.o. female returns for follow-up of her right breast cancer.    History of Present Illness    The patient, with a history of breast cancer, presents for a telephone follow up. She completed adj radiation from 11/13/2022-12/29/2022 She just had a left hip replacement Dec 3rd. She is using a walker. She is still recovering. At this time, she is not interested in pursuing anti estrogen therapy but requests if we can send her an inbasket message so she can read more about tamoxifen.  Rest of the pertinent 10 point ROS reviewed and negative  Patient Active Problem List   Diagnosis Date Noted   Bone infarction of distal tibia, left (HCC) 11/06/2022   Nausea without vomiting 08/21/2022   Port-A-Cath in place 05/22/2022   Genetic testing 05/15/2022   Family history of breast cancer 05/07/2022   Family history of melanoma 05/07/2022   Malignant neoplasm of upper-inner quadrant of right breast in female, estrogen receptor positive (HCC) 05/06/2022   Adjustment disorder with mixed anxiety and depressed mood 03/18/2019   Anxiety 07/12/2018   Abnormal menstruation 07/23/2006   Intractable migraine 08/05/2004   DDD (degenerative disc disease), cervical 08/05/2004  is allergic to contrast media [iodinated contrast media], venlafaxine, amitriptyline, iodine, methocarbamol, and tartrazine.  MEDICAL  HISTORY: Past Medical History:  Diagnosis Date   Anxiety    Cancer (HCC)    Migraines    PONV (postoperative nausea and vomiting)     SURGICAL HISTORY: Past Surgical History:  Procedure Laterality Date   BILATERAL SALPINGECTOMY     BREAST BIOPSY Right 04/25/2022   [place] RT BREAST BX W LOC DEV 1ST LESION IMG BX SPEC US GUIDE 04/25/2022 [facility]   BREAST BIOPSY Left 05/2022   BREAST BIOPSY  10/02/2022   MM RT RADIOACTIVE SEED LOC MAMMO GUIDE 10/02/2022 [facility]   BREAST LUMPECTOMY WITH RADIOACTIVE SEED AND SENTINEL LYMPH NODE BIOPSY Right 10/03/2022   Procedure: RIGHT BREAST LUMPECTOMY WITH RADIOACTIVE SEED AND SENTINEL LYMPH NODE BIOPSY;  Surgeon: Griselda Miner, MD;  Location: [facility];  Service: General;  Laterality: Right;  PEC BLOCK   IR IMAGING GUIDED PORT INSERTION  05/21/2022    SOCIAL HISTORY: Social History   Socioeconomic History   Marital status: Single    Spouse name: Not on file   Number of children: Not on file   Years of education: Not on file   Highest education level: Not on file  Occupational History   Not on file  Tobacco Use   Smoking status: Former    Current packs/day: 0.00    Average packs/day: 0.5 packs/day for 24.2 years (12.1 ttl pk-yrs)    Types: Cigarettes    Start date: 2000    Quit date: 05/07/2022    Years since quitting: 0.7    Passive exposure: Past   Smokeless tobacco: Never   Tobacco comments:    Quit 05/07/2022  Vaping Use   Vaping status: Never Used  Substance and Sexual Activity   Alcohol use: Not Currently   Drug use: Never   Sexual activity: Yes    Birth control/protection: Surgical    Comment: MRI 05-16-22 Left Breast Lump found  Other Topics Concern   Not on file  Social History Narrative   Not on file   Social Determinants of Health   Financial Resource Strain: Low Risk  (11/25/2022)   Received from [facility]   Overall Financial Resource Strain (CARDIA)    Difficulty of Paying  Living Expenses: Not very hard  Food Insecurity: No Food Insecurity (01/13/2023)   Received from [facility]   Hunger Vital Sign    Worried About Running Out of Food in the Last Year: Never true    Ran Out of Food in the Last Year: Never true  Transportation Needs: No Transportation Needs (01/13/2023)   Received from [facility] - Transportation    Lack of Transportation (Medical): No    Lack of Transportation (Non-Medical): No  Physical Activity: Unknown (11/25/2022)   Received [facility]   Exercise Vital Sign    Days of Exercise per Week: 0 days    Minutes of Exercise per Session: Not on file  Stress: No Stress Concern Present (01/13/2023)   Received from [facility] - Occupational Stress Questionnaire    Feeling of Stress : Only a little  Social Connections: Somewhat Isolated (11/25/2022)   Received from [facility]   Social Network    How would you rate your social network (family, work, friends)?: Restricted participation with some degree of social isolation  Intimate Partner Violence: Not At Risk (01/13/2023)   Received  from [facility]   HITS    Over the last 12 months how often did your partner physically hurt you?: Never    Over the last 12 months how often did your partner insult you or talk down to you?: Never    Over the last 12 months how often did your partner threaten you with physical harm?: Never    Over the last 12 months how often did your partner scream or curse at you?: Never    FAMILY HISTORY: Family History  Problem Relation Age of Onset   Migraines Mother    Melanoma Father 6 - 58       dx. again in his early 79s   Breast cancer Paternal Grandmother 69 - 29    PHYSICAL EXAMINATION   There were no vitals filed for this visit.  PE not done, telephone visit.  LABORATORY DATA:  CBC    Component Value Date/Time   WBC 7.4 11/06/2022 1129   RBC 4.41 11/06/2022 1129   HGB 13.0  11/06/2022 1129   HGB 11.9 (L) 09/25/2022 1130   HCT 40.0 11/06/2022 1129   PLT 298.0 11/06/2022 1129   PLT 236 09/25/2022 1130   MCV 90.7 11/06/2022 1129   MCH 32.3 09/25/2022 1130   MCHC 32.5 11/06/2022 1129   RDW 14.9 11/06/2022 1129   LYMPHSABS 2.1 11/06/2022 1129   MONOABS 0.6 11/06/2022 1129   EOSABS 0.1 11/06/2022 1129   BASOSABS 0.0 11/06/2022 1129    CMP     Component Value Date/Time   NA 141 11/06/2022 1129   K 3.7 11/06/2022 1129   CL 103 11/06/2022 1129   CO2 30 11/06/2022 1129   GLUCOSE 94 11/06/2022 1129   BUN 8 11/06/2022 1129   CREATININE 0.69 11/06/2022 1129   CREATININE 0.70 09/25/2022 1130   CALCIUM 10.3 (H) 11/06/2022 1129   CALCIUM 10.4 11/06/2022 1129   PROT 7.3 11/06/2022 1129   ALBUMIN 4.5 11/06/2022 1129   AST 23 11/06/2022 1129   AST 18 09/25/2022 1130   ALT 27 11/06/2022 1129   ALT 23 09/25/2022 1130   ALKPHOS 93 11/06/2022 1129   BILITOT 0.3 11/06/2022 1129   BILITOT 0.3 09/25/2022 1130   GFRNONAA >60 09/25/2022 1130       PENDING LABS:   RADIOGRAPHIC STUDIES:  No results found.   PATHOLOGY:     ASSESSMENT and THERAPY PLAN:   Malignant neoplasm of upper-inner quadrant of right breast in female, estrogen receptor positive (HCC) Vanessa Murphy is a 42 year old woman with stage IIb right-sided breast cancer here today for follow-up and evaluation after completing chemo and radiation.  Stage IIb breast cancer: She has completed her neoadjuvant chemotherapy.  She had complete pathologic response.   She had a very tough course with the neoadjuvant chemoimmunotherapy hence we have decided to omit adjuvant immunotherapy especially since she had complete pathologic response. She completed adjuvant radiation. 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  We have today discussed about role of antiestrogen therapy since she had ER 40% weak staining on the initial biopsy but she is very reluctant  to do any more treatment given the complication she had.  She however requested [place] to send an in basket message with the name of the drug so she can do her research.  She was encouraged to call us if she decides to try it.  If she decides to not try it, we will see her back in  about 6 months.  I have briefly reviewed the mechanism of action of tamoxifen, adverse effects of tamoxifen including risk of DVT/PE.   I connected with  Gelene Payment on 01/19/23 by a telephone application and verified that I am speaking with the correct person using two identifiers.   I discussed the limitations of evaluation and management by telemedicine. The patient expressed understanding and agreed to proceed.   Total encounter time:1 0 minutes*in face-to-face visit time, chart review, lab review, care coordination, order entry, and documentation of the encounter time.   *Total Encounter Time as defined by the Centers for Medicare and Medicaid Services includes, in addition to the face-to-face time of a patient visit (documented in the note above) non-face-to-face time: obtaining and reviewing outside history, ordering and reviewing medications, tests or procedures, care coordination (communications with other health care professionals or caregivers) and documentation in the medical record.

## 2023-01-22 ENCOUNTER — Telehealth: Payer: Self-pay | Admitting: Hematology and Oncology

## 2023-01-22 NOTE — Telephone Encounter (Signed)
Spoke with patient confirming upcoming appointment  

## 2023-01-26 ENCOUNTER — Ambulatory Visit
Admission: RE | Admit: 2023-01-26 | Discharge: 2023-01-26 | Disposition: A | Discharge status: Home / Self Care | Payer: BC Managed Care – PPO | Source: Ambulatory Visit | Attending: Radiation Oncology | Admitting: Radiation Oncology

## 2023-01-26 DIAGNOSIS — Z170 Estrogen receptor positive status [ER+]: Secondary | ICD-10-CM | POA: Insufficient documentation

## 2023-01-26 DIAGNOSIS — C50211 Malignant neoplasm of upper-inner quadrant of right female breast: Secondary | ICD-10-CM | POA: Insufficient documentation

## 2023-01-26 NOTE — Progress Notes (Signed)
  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 (as documented in provider EOT note)  The patient was available for call today.   Symptoms of fatigue have improved since completing therapy.  Symptoms of skin changes have improved since completing therapy.  The patient was encouraged to avoid sun exposure in the area of prior treatment for up to one year following radiation with either sunscreen or by the style of clothing worn in the sun.  The patient has scheduled follow up with her medical oncologist Dr. Al Pimple for ongoing surveillance, and was encouraged to call if she develops concerns or questions regarding radiation.   This concludes the interaction.  Ruel Favors, LPN

## 2023-03-03 ENCOUNTER — Encounter: Payer: Self-pay | Admitting: Hematology and Oncology

## 2023-03-03 ENCOUNTER — Inpatient Hospital Stay: Payer: BC Managed Care – PPO | Attending: Hematology and Oncology | Admitting: Hematology and Oncology

## 2023-03-03 VITALS — BP 121/82 | HR 71 | Temp 98.1°F | Resp 17 | Wt 138.7 lb

## 2023-03-03 DIAGNOSIS — Z170 Estrogen receptor positive status [ER+]: Secondary | ICD-10-CM | POA: Diagnosis not present

## 2023-03-03 DIAGNOSIS — M8580 Other specified disorders of bone density and structure, unspecified site: Secondary | ICD-10-CM | POA: Insufficient documentation

## 2023-03-03 DIAGNOSIS — Z87891 Personal history of nicotine dependence: Secondary | ICD-10-CM | POA: Insufficient documentation

## 2023-03-03 DIAGNOSIS — Z808 Family history of malignant neoplasm of other organs or systems: Secondary | ICD-10-CM | POA: Diagnosis not present

## 2023-03-03 DIAGNOSIS — Z803 Family history of malignant neoplasm of breast: Secondary | ICD-10-CM | POA: Diagnosis not present

## 2023-03-03 DIAGNOSIS — R232 Flushing: Secondary | ICD-10-CM | POA: Insufficient documentation

## 2023-03-03 DIAGNOSIS — C50211 Malignant neoplasm of upper-inner quadrant of right female breast: Secondary | ICD-10-CM | POA: Insufficient documentation

## 2023-03-03 NOTE — Progress Notes (Signed)
[facility] Follow up:    Vanessa Moulds, MD [address] [place] [address]   DIAGNOSIS:  Cancer Staging  Malignant neoplasm of upper-inner quadrant of right breast in female, estrogen receptor positive (HCC) Staging form: Breast, AJCC 8th Edition - Clinical stage from 05/07/2022: Stage IIB (cT2, cN0, cM0, G3, ER+, PR-, HER2-) - Signed by Vanessa Moulds, MD on 03/03/2023 Stage prefix: Initial diagnosis Histologic grading system: 3 grade system Laterality: Right Staged by: Pathologist and managing physician Stage used in treatment planning: Yes National guidelines used in treatment planning: Yes Type of national guideline used in treatment planning: NCCN   SUMMARY OF ONCOLOGIC HISTORY: Oncology History  Malignant 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   04/25/2022 Pathology Results   Right breast needle core biopsy at 3:00 showed high-grade invasive ductal carcinoma.  Prognostic showed ER 40% positive weak staining intensity, PR 0% negative, group 5 HER2 negative and Ki-67 of 80%.   05/07/2022 Cancer Staging   Staging form: Breast, AJCC 8th Edition - Clinical stage from 05/07/2022: Stage IIB (cT2, cN0, cM0, G3, ER+, PR-, HER2-) - Signed by Vanessa Moulds, MD on 03/03/2023 Stage prefix: Initial diagnosis Histologic grading system: 3 grade system Laterality: Right Staged by: Pathologist and managing physician Stage used in treatment planning: Yes National guidelines used in treatment planning: Yes Type of national guideline used in treatment planning: NCCN   05/22/2022 - 08/13/2022 Chemotherapy   Patient is on Treatment Plan : BREAST Pembrolizumab (200) D1 + Carboplatin (1.5) D1,8,15 + Paclitaxel (80) D1,8,15 q21d X 4 cycles /  Pembrolizumab (200) D1 + AC D1 q21d x 4 cycles      Genetic Testing   [facility] Custom Cancer Panel+RNA was Negative. Report date is 05/14/2022.  The Custom Hereditary Cancers Panel offered by [facility] includes sequencing and/or deletion duplication testing of the following 43 genes: APC, ATM, AXIN2, BAP1, BARD1, BMPR1A, BRCA1, BRCA2, BRIP1, CDH1, CDK4, CDKN2A (p14ARF and p16INK4a only), CHEK2, CTNNA1, EPCAM (Deletion/duplication testing only), FH, GREM1 (promoter region duplication testing only), HOXB13, KIT, MBD4, MEN1, MLH1, MSH2, MSH3, MSH6, MUTYH, NF1, NHTL1, PALB2, PDGFRA, PMS2, POLD1, POLE, PTEN, RAD51C, RAD51D, SMAD4, SMARCA4. STK11, TP53, TSC1, TSC2, and VHL.   10/03/2022 Definitive Surgery   She had right breast lumpectomy which showed benign breast tissue with chronic inflammation, hemosiderin deposit and fibroblastic reaction consistent with biopsy site changes and treatment effects.  No evidence of residual carcinoma.  Right axillary sentinel lymph nodes were all negative without any evidence of residual carcinoma.     CURRENT THERAPY: preparing for surgery  INTERVAL HISTORY:  Vanessa Murphy 43 y.o. female returns for follow-up of her right breast cancer.    Discussed the use of AI scribe software for clinical note transcription with the patient, who gave verbal consent to proceed.  History of Present Illness    The patient, with a history of breast cancer and recent hip surgery, presents for a follow-up. She reports feeling 'pretty good' and has been dismissed from physical therapy for rehabilitation after her hip surgery. She notes a missed Sozo screening appointment, which the doctor agrees to reschedule.  The patient reports a 'weird' feeling in her breast, describing it as tight and a little sore. She denies being taught any exercises for the breast by physical therapy. She also  mentions a knot in the breast, but is unsure if it's a recurrence of cancer or a result of  treatment.  The patient also mentions an upcoming ankle surgery, which she is trying to delay to build up her Health Savings Account (HSA). She notes a decrease in swelling and pain in the ankle since her hip surgery, suggesting a change in weight bearing mechanics.  The patient declines the use of tamoxifen, citing a desire to avoid unnecessary side effects. She expresses interest in a new blood test to detect tumor DNA, which the doctor offers to arrange.  The patient also reports ongoing hot flashes and poor sleep, which she hopes will improve once her menstrual cycle resumes.  Rest of the pertinent 10 point ROS reviewed and negative  Patient Active Problem List   Diagnosis Date Noted   Bone infarction of distal tibia, left (HCC) 11/06/2022   Nausea without vomiting 08/21/2022   Port-A-Cath in place 05/22/2022   Genetic testing 05/15/2022   Family history of breast cancer 05/07/2022   Family history of melanoma 05/07/2022   Malignant neoplasm of upper-inner quadrant of right breast in female, estrogen receptor positive (HCC) 05/06/2022   Adjustment disorder with mixed anxiety and depressed mood 03/18/2019   Anxiety 07/12/2018   Abnormal menstruation 07/23/2006   Intractable migraine 08/05/2004   DDD (degenerative disc disease), cervical 08/05/2004    is allergic to contrast media [iodinated contrast media], venlafaxine, amitriptyline, iodine, methocarbamol, and tartrazine.  MEDICAL HISTORY: Past Medical History:  Diagnosis Date   Anxiety    Cancer (HCC)    Migraines    PONV (postoperative nausea and vomiting)     SURGICAL HISTORY: Past Surgical History:  Procedure Laterality Date   BILATERAL SALPINGECTOMY     BREAST BIOPSY Right 04/25/2022   [place] RT BREAST BX W LOC DEV 1ST LESION IMG BX SPEC US GUIDE 04/25/2022 [facility]   BREAST BIOPSY Left 05/2022   BREAST BIOPSY  10/02/2022   MM RT RADIOACTIVE SEED LOC MAMMO GUIDE 10/02/2022 [facility]   BREAST  LUMPECTOMY WITH RADIOACTIVE SEED AND SENTINEL LYMPH NODE BIOPSY Right 10/03/2022   Procedure: RIGHT BREAST LUMPECTOMY WITH RADIOACTIVE SEED AND SENTINEL LYMPH NODE BIOPSY;  Surgeon: Griselda Miner, MD;  Location: [facility];  Service: General;  Laterality: Right;  PEC BLOCK   IR IMAGING GUIDED PORT INSERTION  05/21/2022    SOCIAL HISTORY: Social History   Socioeconomic History   Marital status: Single    Spouse name: Not on file   Number of children: Not on file   Years of education: Not on file   Highest education level: Not on file  Occupational History   Not on file  Tobacco Use   Smoking status: Former    Current packs/day: 0.00    Average packs/day: 0.5 packs/day for 24.2 years (12.1 ttl pk-yrs)    Types: Cigarettes    Start date: 2000    Quit date: 05/07/2022    Years since quitting: 0.8    Passive exposure: Past   Smokeless tobacco: Never   Tobacco comments:    Quit 05/07/2022  Vaping Use   Vaping status: Never Used  Substance and Sexual Activity   Alcohol use: Not Currently   Drug use: Never   Sexual activity: Yes    Birth control/protection: Surgical    Comment: MRI 05-16-22 Left Breast Lump found  Other Topics Concern   Not on file  Social History Narrative   Not on file  Social Drivers of Corporate investment banker Strain: Low Risk  (11/25/2022)   Received from [facility]   Overall Financial Resource Strain (CARDIA)    Difficulty of Paying Living Expenses: Not very hard  Food Insecurity: No Food Insecurity (01/13/2023)   Received from [facility]   Hunger Vital Sign    Worried About Running Out of Food in the Last Year: Never true    Ran Out of Food in the Last Year: Never true  Transportation Needs: No Transportation Needs (01/13/2023)   Received from [facility] - Transportation    Lack of Transportation (Medical): No    Lack of Transportation (Non-Medical): No  Physical Activity: Unknown (11/25/2022)   Received from  [facility]   Exercise Vital Sign    Days of Exercise per Week: 0 days    Minutes of Exercise per Session: Not on file  Stress: No Stress Concern Present (01/13/2023)   Received from [facility] - Occupational Stress Questionnaire    Feeling of Stress : Only a little  Social Connections: Somewhat Isolated (11/25/2022)   Received from [facility]   Social Network    How would you rate your social network (family, work, friends)?: Restricted participation with some degree of social isolation  Intimate Partner Violence: Not At Risk (01/13/2023)   Received from [facility]   HITS    Over the last 12 months how often did your partner physically hurt you?: Never    Over the last 12 months how often did your partner insult you or talk down to you?: Never    Over the last 12 months how often did your partner threaten you with physical harm?: Never    Over the last 12 months how often did your partner scream or curse at you?: Never    FAMILY HISTORY: Family History  Problem Relation Age of Onset   Migraines Mother    Melanoma Father 34 - 53       dx. again in his early 52s   Breast cancer Paternal Grandmother 51 - 43    PHYSICAL EXAMINATION   Vitals:   03/03/23 0930  BP: 121/82  Pulse: 71  Resp: 17  Temp: 98.1 F (36.7 C)  SpO2: 100%    Physical Exam Constitutional:      General: She is not in acute distress.    Appearance: Normal appearance. She is not toxic-appearing.  HENT:     Head: Normocephalic and atraumatic.     Mouth/Throat:     Mouth: Mucous membranes are moist.     Pharynx: Oropharynx is clear. No oropharyngeal exudate or posterior oropharyngeal erythema.  Eyes:     General: No scleral icterus. Cardiovascular:     Rate and Rhythm: Normal rate and regular rhythm.     Pulses: Normal pulses.     Heart sounds: Normal heart sounds.  Pulmonary:     Effort: Pulmonary effort is normal.     Breath sounds: Normal  breath sounds.  Abdominal:     General: Abdomen is flat. Bowel sounds are normal. There is no distension.     Palpations: Abdomen is soft.     Tenderness: There is no abdominal tenderness.  Musculoskeletal:        General: No swelling.     Cervical back: Neck supple.  Lymphadenopathy:     Cervical: No cervical adenopathy.  Skin:    General: Skin is warm and dry.  Findings: No rash.  Neurological:     General: No focal deficit present.     Mental Status: She is alert.  Psychiatric:        Mood and Affect: Mood normal.        Behavior: Behavior normal.     LABORATORY DATA:  CBC    Component Value Date/Time   WBC 7.4 11/06/2022 1129   RBC 4.41 11/06/2022 1129   HGB 13.0 11/06/2022 1129   HGB 11.9 (L) 09/25/2022 1130   HCT 40.0 11/06/2022 1129   PLT 298.0 11/06/2022 1129   PLT 236 09/25/2022 1130   MCV 90.7 11/06/2022 1129   MCH 32.3 09/25/2022 1130   MCHC 32.5 11/06/2022 1129   RDW 14.9 11/06/2022 1129   LYMPHSABS 2.1 11/06/2022 1129   MONOABS 0.6 11/06/2022 1129   EOSABS 0.1 11/06/2022 1129   BASOSABS 0.0 11/06/2022 1129    CMP     Component Value Date/Time   NA 141 11/06/2022 1129   K 3.7 11/06/2022 1129   CL 103 11/06/2022 1129   CO2 30 11/06/2022 1129   GLUCOSE 94 11/06/2022 1129   BUN 8 11/06/2022 1129   CREATININE 0.69 11/06/2022 1129   CREATININE 0.70 09/25/2022 1130   CALCIUM 10.3 (H) 11/06/2022 1129   CALCIUM 10.4 11/06/2022 1129   PROT 7.3 11/06/2022 1129   ALBUMIN 4.5 11/06/2022 1129   AST 23 11/06/2022 1129   AST 18 09/25/2022 1130   ALT 27 11/06/2022 1129   ALT 23 09/25/2022 1130   ALKPHOS 93 11/06/2022 1129   BILITOT 0.3 11/06/2022 1129   BILITOT 0.3 09/25/2022 1130   GFRNONAA >60 09/25/2022 1130       PENDING LABS:   RADIOGRAPHIC STUDIES:  No results found.   PATHOLOGY:     ASSESSMENT and THERAPY PLAN:   Malignant neoplasm of upper-inner quadrant of right breast in female, estrogen receptor positive (HCC) Destiny  is a 43 year old woman with stage IIb right-sided breast cancer here today for follow-up and evaluation after completing chemo and radiation. She had complete pathologic response after neoadjuvant chemoimmunotherapy. She completed adjuvant radiation and declined adj tamoxifen. She had a very complicated course with arthralgias during neoad hence we omitted adj immunotherapy.  [name] reports tightness and a knot in the breast area. Physical examination revealed no abnormalities. The tightness is likely due to post-treatment effects. -Refer to physical therapy for exercises to help with the tightness. -Consider a blood test to detect tumor DNA in the blood for additional monitoring. Gave her information about Guardant Reveal.  Osteopenia -Detected on a recent bone density test. -Encourage weight-bearing exercises to strengthen bones.  Hot Flashes Patient reports ongoing hot flashes and poor sleep. Likely due to hormonal changes post-treatment. -Advise that hot flashes should improve once menstrual cycle resumes.  Missed Sozo Screening Patient missed a Sozo screening appointment due to hip surgery. -Reschedule the missed Sozo screening. Sent a message to valerie from PT.  General Health Maintenance / Followup Plans -Return visit in six months. -Continue with current management of other health issues (hip surgery recovery, ankle issue).      All questions were answered. The patient knows to call the clinic with any problems, questions or concerns. We can certainly see the patient much sooner if necessary.  Total encounter time:30 minutes*in face-to-face visit time, chart review, lab review, care coordination, order entry, and documentation of the encounter time.    *Total Encounter Time as defined by the Centers for Medicare and  Medicaid Services includes, in addition to the face-to-face time of a patient visit (documented in the note above)  non-face-to-face time: obtaining and reviewing outside history, ordering and reviewing medications, tests or procedures, care coordination (communications with other health care professionals or caregivers) and documentation in the medical record.

## 2023-03-03 NOTE — Assessment & Plan Note (Addendum)
Vanessa Murphy is a 43 year old woman with stage IIb right-sided breast cancer here today for follow-up and evaluation after completing chemo and radiation. She had complete pathologic response after neoadjuvant chemoimmunotherapy. She completed adjuvant radiation and declined adj tamoxifen. She had a very complicated course with arthralgias during neoad hence we omitted adj immunotherapy.  [name] reports tightness and a knot in the breast area. Physical examination revealed no abnormalities. The tightness is likely due to post-treatment effects. -Refer to physical therapy for exercises to help with the tightness. -Consider a blood test to detect tumor DNA in the blood for additional monitoring. Gave her information about Guardant Reveal.  Osteopenia -Detected on a recent bone density test. -Encourage weight-bearing exercises to strengthen bones.  Hot Flashes Patient reports ongoing hot flashes and poor sleep. Likely due to hormonal changes post-treatment. -Advise that hot flashes should improve once menstrual cycle resumes.  Missed Sozo Screening Patient missed a Sozo screening appointment due to hip surgery. -Reschedule the missed Sozo screening. Sent a message to Vanessa Murphy from PT.  General Health Maintenance / Followup Plans -Return visit in six months. -Continue with current management of other health issues (hip surgery recovery, ankle issue).

## 2023-03-05 ENCOUNTER — Ambulatory Visit: Payer: Self-pay | Admitting: General Surgery

## 2023-03-11 ENCOUNTER — Telehealth: Payer: Self-pay

## 2023-03-11 NOTE — Telephone Encounter (Signed)
Request for medical records received from [facility].  Spoke with patient to inform her of the request and that an updated release of information was needed to send to Medical Records dept. Copy of ROI faxed to patient per request with instructions on where to send document back via fax or email.

## 2023-03-16 ENCOUNTER — Telehealth: Payer: Self-pay

## 2023-03-16 NOTE — Telephone Encounter (Signed)
Request for medical records received from [facility]. Request and patient's signed ROI faxed to system wide HIM for processing. No further actions needed at this time.

## 2023-03-17 ENCOUNTER — Encounter: Payer: Self-pay | Admitting: Hematology and Oncology

## 2023-04-06 ENCOUNTER — Encounter: Payer: Self-pay | Admitting: Hematology and Oncology

## 2023-04-23 ENCOUNTER — Other Ambulatory Visit: Payer: Self-pay

## 2023-04-23 ENCOUNTER — Encounter (HOSPITAL_BASED_OUTPATIENT_CLINIC_OR_DEPARTMENT_OTHER): Payer: Self-pay | Admitting: General Surgery

## 2023-04-28 ENCOUNTER — Encounter: Payer: Self-pay | Admitting: Hematology and Oncology

## 2023-05-01 ENCOUNTER — Ambulatory Visit (HOSPITAL_BASED_OUTPATIENT_CLINIC_OR_DEPARTMENT_OTHER)
Admission: RE | Admit: 2023-05-01 | Discharge: 2023-05-01 | Disposition: A | Discharge status: Home / Self Care | Payer: BC Managed Care – PPO | Attending: General Surgery | Admitting: General Surgery

## 2023-05-01 ENCOUNTER — Other Ambulatory Visit: Payer: Self-pay

## 2023-05-01 ENCOUNTER — Ambulatory Visit (HOSPITAL_BASED_OUTPATIENT_CLINIC_OR_DEPARTMENT_OTHER): Admitting: Certified Registered"

## 2023-05-01 ENCOUNTER — Encounter (HOSPITAL_BASED_OUTPATIENT_CLINIC_OR_DEPARTMENT_OTHER): Payer: Self-pay | Admitting: General Surgery

## 2023-05-01 ENCOUNTER — Encounter (HOSPITAL_BASED_OUTPATIENT_CLINIC_OR_DEPARTMENT_OTHER)
Admission: RE | Disposition: A | Discharge status: Home / Self Care | Payer: Self-pay | Source: Home / Self Care | Attending: General Surgery

## 2023-05-01 DIAGNOSIS — Z170 Estrogen receptor positive status [ER+]: Secondary | ICD-10-CM | POA: Insufficient documentation

## 2023-05-01 DIAGNOSIS — C50311 Malignant neoplasm of lower-inner quadrant of right female breast: Secondary | ICD-10-CM | POA: Insufficient documentation

## 2023-05-01 DIAGNOSIS — Z87891 Personal history of nicotine dependence: Secondary | ICD-10-CM | POA: Diagnosis not present

## 2023-05-01 DIAGNOSIS — Z5986 Financial insecurity: Secondary | ICD-10-CM | POA: Insufficient documentation

## 2023-05-01 DIAGNOSIS — Z1722 Progesterone receptor negative status: Secondary | ICD-10-CM | POA: Diagnosis not present

## 2023-05-01 DIAGNOSIS — Z01818 Encounter for other preprocedural examination: Secondary | ICD-10-CM

## 2023-05-01 DIAGNOSIS — Z1732 Human epidermal growth factor receptor 2 negative status: Secondary | ICD-10-CM | POA: Insufficient documentation

## 2023-05-01 HISTORY — PX: PORT-A-CATH REMOVAL: SHX5289

## 2023-05-01 LAB — POCT PREGNANCY, URINE: Preg Test, Ur: NEGATIVE

## 2023-05-01 SURGERY — REMOVAL PORT-A-CATH
Anesthesia: Monitor Anesthesia Care | Site: Chest | Laterality: Left

## 2023-05-01 MED ORDER — LIDOCAINE 2% (20 MG/ML) 5 ML SYRINGE
INTRAMUSCULAR | Status: AC
  Filled 2023-05-01: qty 5

## 2023-05-01 MED ORDER — CHLORHEXIDINE GLUCONATE CLOTH 2 % EX PADS
6.0000 | MEDICATED_PAD | Freq: Once | CUTANEOUS | Status: DC
Start: 2023-05-01 — End: 2023-05-01

## 2023-05-01 MED ORDER — ONDANSETRON HCL 4 MG/2ML IJ SOLN
INTRAMUSCULAR | Status: DC | PRN
  Administered 2023-05-01: 4 mg via INTRAVENOUS

## 2023-05-01 MED ORDER — LACTATED RINGERS IV SOLN: INTRAVENOUS | Status: DC | PRN

## 2023-05-01 MED ORDER — ACETAMINOPHEN 500 MG PO TABS
ORAL_TABLET | ORAL | Status: AC
  Filled 2023-05-01: qty 2

## 2023-05-01 MED ORDER — BUPIVACAINE-EPINEPHRINE 0.25% -1:200000 IJ SOLN
INTRAMUSCULAR | Status: DC | PRN
  Administered 2023-05-01: 10 mL

## 2023-05-01 MED ORDER — BUPIVACAINE-EPINEPHRINE (PF) 0.25% -1:200000 IJ SOLN
INTRAMUSCULAR | Status: AC
  Filled 2023-05-01: qty 90

## 2023-05-01 MED ORDER — ONDANSETRON HCL 4 MG/2ML IJ SOLN: 4.0000 mg | Freq: Once | INTRAMUSCULAR | Status: DC | PRN

## 2023-05-01 MED ORDER — PROPOFOL 10 MG/ML IV BOLUS
INTRAVENOUS | Status: AC
  Filled 2023-05-01: qty 20

## 2023-05-01 MED ORDER — ONDANSETRON HCL 4 MG/2ML IJ SOLN
INTRAMUSCULAR | Status: AC
  Filled 2023-05-01: qty 2

## 2023-05-01 MED ORDER — BUPIVACAINE-EPINEPHRINE (PF) 0.5% -1:200000 IJ SOLN
INTRAMUSCULAR | Status: AC
  Filled 2023-05-01: qty 90

## 2023-05-01 MED ORDER — FENTANYL CITRATE (PF) 100 MCG/2ML IJ SOLN
INTRAMUSCULAR | Status: DC | PRN
  Administered 2023-05-01 (×2): 50 ug via INTRAVENOUS

## 2023-05-01 MED ORDER — ACETAMINOPHEN 500 MG PO TABS
1000.0000 mg | ORAL_TABLET | Freq: Once | ORAL | Status: AC
  Administered 2023-05-01: 1000 mg via ORAL

## 2023-05-01 MED ORDER — CHLORHEXIDINE GLUCONATE CLOTH 2 % EX PADS: 6.0000 | MEDICATED_PAD | Freq: Once | CUTANEOUS | Status: DC

## 2023-05-01 MED ORDER — PROPOFOL 500 MG/50ML IV EMUL
INTRAVENOUS | Status: DC | PRN
  Administered 2023-05-01 (×2): 200 ug/kg/min via INTRAVENOUS

## 2023-05-01 MED ORDER — MIDAZOLAM HCL 2 MG/2ML IJ SOLN
INTRAMUSCULAR | Status: AC
  Filled 2023-05-01: qty 2

## 2023-05-01 MED ORDER — MIDAZOLAM HCL 5 MG/5ML IJ SOLN
INTRAMUSCULAR | Status: DC | PRN
  Administered 2023-05-01: 2 mg via INTRAVENOUS

## 2023-05-01 MED ORDER — TRAMADOL HCL 50 MG PO TABS: 50.0000 mg | ORAL_TABLET | Freq: Four times a day (QID) | ORAL | 0 refills | Status: DC | PRN

## 2023-05-01 MED ORDER — AMISULPRIDE (ANTIEMETIC) 5 MG/2ML IV SOLN: 10.0000 mg | Freq: Once | INTRAVENOUS | Status: DC | PRN

## 2023-05-01 MED ORDER — LACTATED RINGERS IV SOLN: INTRAVENOUS | Status: DC

## 2023-05-01 MED ORDER — FENTANYL CITRATE (PF) 100 MCG/2ML IJ SOLN: 25.0000 ug | INTRAMUSCULAR | Status: DC | PRN

## 2023-05-01 MED ORDER — DEXAMETHASONE SODIUM PHOSPHATE 10 MG/ML IJ SOLN
INTRAMUSCULAR | Status: AC
  Filled 2023-05-01: qty 1

## 2023-05-01 MED ORDER — FENTANYL CITRATE (PF) 100 MCG/2ML IJ SOLN
INTRAMUSCULAR | Status: AC
Start: 2023-05-01 — End: ?
  Filled 2023-05-01: qty 2

## 2023-05-01 MED ORDER — EPHEDRINE SULFATE (PRESSORS) 50 MG/ML IJ SOLN
INTRAMUSCULAR | Status: DC | PRN
  Administered 2023-05-01: 10 mg via INTRAVENOUS

## 2023-05-01 SURGICAL SUPPLY — 28 items
BLADE SURG 15 STRL LF DISP TIS (BLADE) ×1 IMPLANT
CHLORAPREP W/TINT 26 (MISCELLANEOUS) ×1 IMPLANT
COVER BACK TABLE 60X90IN (DRAPES) ×1 IMPLANT
COVER MAYO STAND STRL (DRAPES) ×1 IMPLANT
DERMABOND ADVANCED .7 DNX12 (GAUZE/BANDAGES/DRESSINGS) ×1 IMPLANT
DRAPE LAPAROTOMY 100X72 PEDS (DRAPES) ×1 IMPLANT
DRAPE UTILITY XL STRL (DRAPES) ×1 IMPLANT
ELECT COATED BLADE 2.86 ST (ELECTRODE) IMPLANT
ELECT REM PT RETURN 9FT ADLT (ELECTROSURGICAL) IMPLANT
ELECTRODE REM PT RTRN 9FT ADLT (ELECTROSURGICAL) IMPLANT
GLOVE BIO SURGEON STRL SZ7.5 (GLOVE) ×1 IMPLANT
GLOVE BIOGEL PI IND STRL 7.5 (GLOVE) IMPLANT
GLOVE SURG SYN 7.5 E (GLOVE) ×1 IMPLANT
GLOVE SURG SYN 7.5 PF PI (GLOVE) IMPLANT
GOWN STRL REUS W/ TWL LRG LVL3 (GOWN DISPOSABLE) ×2 IMPLANT
GOWN STRL REUS W/ TWL XL LVL3 (GOWN DISPOSABLE) IMPLANT
NDL HYPO 22X1.5 SAFETY MO (MISCELLANEOUS) IMPLANT
NDL HYPO 25X1 1.5 SAFETY (NEEDLE) ×1 IMPLANT
NEEDLE HYPO 22X1.5 SAFETY MO (MISCELLANEOUS) ×1 IMPLANT
NEEDLE HYPO 25X1 1.5 SAFETY (NEEDLE) IMPLANT
PACK BASIN DAY SURGERY FS (CUSTOM PROCEDURE TRAY) ×1 IMPLANT
PENCIL SMOKE EVACUATOR (MISCELLANEOUS) IMPLANT
SLEEVE SCD COMPRESS KNEE MED (STOCKING) IMPLANT
SPIKE FLUID TRANSFER (MISCELLANEOUS) ×1 IMPLANT
SUT MON AB 4-0 PC3 18 (SUTURE) ×1 IMPLANT
SUT VIC AB 3-0 SH 27X BRD (SUTURE) ×1 IMPLANT
SYR CONTROL 10ML LL (SYRINGE) ×1 IMPLANT
TOWEL GREEN STERILE FF (TOWEL DISPOSABLE) ×1 IMPLANT

## 2023-05-01 NOTE — Discharge Instructions (Signed)
  Post Anesthesia Home Care Instructions  Activity: Get plenty of rest for the remainder of the day. A responsible individual must stay with you for 24 hours following the procedure.  For the next 24 hours, DO NOT: -Drive a car -Advertising copywriter -Drink alcoholic beverages -Take any medication unless instructed by your physician -Make any legal decisions or sign important papers.  Meals: Start with liquid foods such as gelatin or soup. Progress to regular foods as tolerated. Avoid greasy, spicy, heavy foods. If nausea and/or vomiting occur, drink only clear liquids until the nausea and/or vomiting subsides. Call your physician if vomiting continues.  Special Instructions/Symptoms: Your throat may feel dry or sore from the anesthesia or the breathing tube placed in your throat during surgery. If this causes discomfort, gargle with warm salt water. The discomfort should disappear within 24 hours.  If you had a scopolamine patch placed behind your ear for the management of post- operative nausea and/or vomiting:  1. The medication in the patch is effective for 72 hours, after which it should be removed.  Wrap patch in a tissue and discard in the trash. Wash hands thoroughly with soap and water. 2. You may remove the patch earlier than 72 hours if you experience unpleasant side effects which may include dry mouth, dizziness or visual disturbances. 3. Avoid touching the patch. Wash your hands with soap and water after contact with the patch.     May have Tylenol today after 1:45 PM

## 2023-05-01 NOTE — Anesthesia Preprocedure Evaluation (Addendum)
 Anesthesia Evaluation  Patient identified by MRN, date of birth, ID band Patient awake    Reviewed: Allergy & Precautions, NPO status , Patient's Chart, lab work & pertinent test results  History of Anesthesia Complications (+) PONV and history of anesthetic complications  Airway Mallampati: II  TM Distance: >3 FB Neck ROM: Full    Dental  (+) Dental Advisory Given, Missing   Pulmonary former smoker   Pulmonary exam normal breath sounds clear to auscultation       Cardiovascular negative cardio ROS Normal cardiovascular exam Rhythm:Regular Rate:Normal     Neuro/Psych  Headaches PSYCHIATRIC DISORDERS Anxiety        GI/Hepatic negative GI ROS, Neg liver ROS,,,  Endo/Other  negative endocrine ROS    Renal/GU negative Renal ROS     Musculoskeletal  (+) Arthritis ,    Abdominal   Peds  Hematology negative hematology ROS (+)   Anesthesia Other Findings right breast cancer  Reproductive/Obstetrics                             Anesthesia Physical Anesthesia Plan  ASA: 2  Anesthesia Plan: MAC   Post-op Pain Management: Tylenol PO (pre-op)*   Induction: Intravenous  PONV Risk Score and Plan: 3 and Midazolam, TIVA, Dexamethasone and Ondansetron  Airway Management Planned: Natural Airway and Simple Face Mask  Additional Equipment:   Intra-op Plan:   Post-operative Plan:   Informed Consent: I have reviewed the patients History and Physical, chart, labs and discussed the procedure including the risks, benefits and alternatives for the proposed anesthesia with the patient or authorized representative who has indicated his/her understanding and acceptance.     Dental advisory given  Plan Discussed with: CRNA  Anesthesia Plan Comments:        Anesthesia Quick Evaluation

## 2023-05-01 NOTE — Anesthesia Postprocedure Evaluation (Signed)
 Anesthesia Post Note  Patient: Vanessa Murphy  Procedure(s) Performed: PORT REMOVAL (Left: Chest)     Patient location during evaluation: PACU Anesthesia Type: MAC Level of consciousness: awake and alert Pain management: pain level controlled Vital Signs Assessment: post-procedure vital signs reviewed and stable Respiratory status: spontaneous breathing, nonlabored ventilation and respiratory function stable Cardiovascular status: stable and blood pressure returned to baseline Postop Assessment: no apparent nausea or vomiting Anesthetic complications: no   No notable events documented.  Last Vitals:  Vitals:   05/01/23 0905 05/01/23 0918  BP: 97/69 118/75  Pulse: 63 77  Resp: 12 16  Temp:  (!) 36.3 C  SpO2: 97% 96%    Last Pain:  Vitals:   05/01/23 0918  TempSrc:   PainSc: 0-No pain                 Collene Schlichter

## 2023-05-01 NOTE — Anesthesia Procedure Notes (Signed)
 Procedure Name: MAC Date/Time: 05/01/2023 8:00 AM  Performed by: Karen Kitchens, CRNAPlacement Confirmation: CO2 detector

## 2023-05-01 NOTE — H&P (Signed)
 MRN: [id number] DOB: Mar 27, 1980 Subjective   Chief Complaint: Post Operative Visit   History of Present Illness: Vanessa Murphy is a 43 y.o. female who is seen today for right breast cancer. The patient is a 43 year old white female who is about 2 weeks status post right breast lumpectomy and sentinel node biopsy for a 2.2 cm cancer in the lower inner quadrant that was ER positive and PR negative and HER2 negative with a Ki-67 of 80%. She underwent neoadjuvant chemotherapy and had a complete pathologic response. There was no residual cancer left in the breast and the lymph nodes were all normal. She tolerated the surgery well. She does report some numbness in the posterior axilla.    Review of Systems: A complete review of systems was obtained from the patient. I have reviewed this information and discussed as appropriate with the patient. See HPI as well for other ROS.  ROS   Medical History: Past Medical History:  Diagnosis Date  Anemia  Anxiety  Arrhythmia   Patient Active Problem List  Diagnosis  Malignant neoplasm of lower-inner quadrant of right breast of female, estrogen receptor positive (CMS/HHS-HCC)   Past Surgical History:  Procedure Laterality Date  LAPAROSCOPIC TUBAL LIGATION  2017  MASTECTOMY PARTIAL / LUMPECTOMY    Allergies  Allergen Reactions  Amitriptyline Other (See Comments)  Iodinated Contrast Media Vomiting  Methocarbamol Itching, Other (See Comments) and Unknown  HIVES   Current Outpatient Medications on File Prior to Visit  Medication Sig Dispense Refill  ergocalciferol, vitamin D2, 1,250 mcg (50,000 unit) capsule Take 50,000 Units by mouth every 7 (seven) days  lidocaine-prilocaine (EMLA) cream Apply to affected area once  OLANZapine (ZYPREXA) 5 MG tablet Take 5 mg by mouth at bedtime  ondansetron (ZOFRAN) 8 MG tablet Take by mouth  predniSONE (DELTASONE) 5 MG tablet  prochlorperazine (COMPAZINE) 10 MG tablet Take 10 mg by mouth every 6  (six) hours as needed  tiZANidine (ZANAFLEX) 2 MG tablet TAKE 1 TABLET(2 MG) BY MOUTH AT BEDTIME AS NEEDED FOR MUSCLE SPASMS  VEOZAH 45 mg Tab Take by mouth  zolpidem (AMBIEN) 5 MG tablet Take 5 mg by mouth at bedtime as needed   No current facility-administered medications on file prior to visit.   Family History  Problem Relation Age of Onset  Skin cancer Father    Social History   Tobacco Use  Smoking Status Some Days  Types: Cigarettes  Smokeless Tobacco Not on file    Social History   Socioeconomic History  Marital status: Single  Tobacco Use  Smoking status: Some Days  Types: Cigarettes  Substance and Sexual Activity  Alcohol use: Yes  Drug use: Never   Social Determinants of Health   Financial Resource Strain: Medium Risk (05/07/2022)  Received from [facility]  Overall Financial Resource Strain (CARDIA)  Difficulty of Paying Living Expenses: Somewhat hard  Food Insecurity: No Food Insecurity (05/07/2022)  Received from [facility]  Hunger Vital Sign  Worried About Running Out of Food in the Last Year: Never true  Ran Out of Food in the Last Year: Never true  Transportation Needs: No Transportation Needs (05/07/2022)  Received from [facility] - Transportation  Lack of Transportation (Medical): No  Lack of Transportation (Non-Medical): No  Stress: Stress Concern Present (05/07/2022)  Received from [facility] - Occupational Stress Questionnaire  Feeling of Stress : Very much   Objective:   Vitals:  PainSc: 0-No pain   There is  no height or weight on file to calculate BMI.  Physical Exam Vitals reviewed.  Constitutional:  General: She is not in acute distress. Appearance: Normal appearance.  HENT:  Head: Normocephalic and atraumatic.  Right Ear: External ear normal.  Left Ear: External ear normal.  Nose: Nose normal.  Mouth/Throat:  Mouth: Mucous membranes are moist.  Pharynx: Oropharynx is  clear.  Eyes:  General: No scleral icterus. Extraocular Movements: Extraocular movements intact.  Conjunctiva/sclera: Conjunctivae normal.  Pupils: Pupils are equal, round, and reactive to light.  Cardiovascular:  Rate and Rhythm: Normal rate and regular rhythm.  Pulses: Normal pulses.  Heart sounds: Normal heart sounds.  Pulmonary:  Effort: Pulmonary effort is normal. No respiratory distress.  Breath sounds: Normal breath sounds.  Abdominal:  General: Bowel sounds are normal.  Palpations: Abdomen is soft.  Tenderness: There is no abdominal tenderness.  Musculoskeletal:  General: No swelling, tenderness or deformity. Normal range of motion.  Cervical back: Normal range of motion and neck supple.  Skin: General: Skin is warm and dry.  Coloration: Skin is not jaundiced.  Neurological:  General: No focal deficit present.  Mental Status: She is alert and oriented to person, place, and time.  Psychiatric:  Mood and Affect: Mood normal.  Behavior: Behavior normal.     Breast: The right breast periareolar and axillary incisions are healing nicely with no sign of infection or seroma  Labs, Imaging and Diagnostic Testing:  Assessment and Plan:   Diagnoses and all orders for this visit:  Malignant neoplasm of lower-inner quadrant of right breast of female, estrogen receptor positive (CMS/HHS-HCC)    The patient is about 2 weeks status post right breast lumpectomy for breast cancer. She tolerated the surgery well. She did have a complete pathologic response. At this point she will follow-up with medical and radiation oncology for adjuvant therapy. I will plan to see her back in about 6 months to check her progress She presents today for her port removal.  I have discussed with her in detail the risks and benefits of the operation as well as some of the technical aspects and she understands and wishes to proceed

## 2023-05-01 NOTE — Interval H&P Note (Signed)
 History and Physical Interval Note:  05/01/2023 7:36 AM  Vanessa Murphy  has presented today for surgery, with the diagnosis of right breast cancer.  The various methods of treatment have been discussed with the patient and family. After consideration of risks, benefits and other options for treatment, the patient has consented to  Procedure(s): PORT REMOVAL (Left) as a surgical intervention.  The patient's history has been reviewed, patient examined, no change in status, stable for surgery.  I have reviewed the patient's chart and labs.  Questions were answered to the patient's satisfaction.     Chevis Pretty III

## 2023-05-01 NOTE — Op Note (Signed)
 05/01/2023  8:16 AM  PATIENT:  Vanessa Murphy  43 y.o. female  PRE-OPERATIVE DIAGNOSIS:  right breast cancer  POST-OPERATIVE DIAGNOSIS:  right breast cancer  PROCEDURE:  Procedure(s): PORT REMOVAL (Left)  SURGEON:  Surgeons and Role:    * Griselda Miner, MD - Primary  PHYSICIAN ASSISTANT:   ASSISTANTS: none   ANESTHESIA:   local and IV sedation  EBL:  15 mL   BLOOD ADMINISTERED:none  DRAINS: none   LOCAL MEDICATIONS USED:  MARCAINE     SPECIMEN:  No Specimen  DISPOSITION OF SPECIMEN:  N/A  COUNTS:  YES  TOURNIQUET:  * No tourniquets in log *  DICTATION: .Dragon Dictation  Consent was obtained the patient was brought to the operating room and placed in the supine position on the operating table.  After adequate IV sedation had been given the patient was right chest area was prepped with ChloraPrep, allowed to dry, and draped in usual sterile manner.  An appropriate timeout was performed.  The area around the port was infiltrated with quarter percent Marcaine until a good field block was created.  A small incision was made with a 15 blade knife through her previous incision.  The incision was carried through the subcutaneous tissue sharply with the 15 blade knife until the capsule surrounding the port was opened.  The port was then gently pushed out of its pocket and with gentle traction was removed from the patient without difficulty.  Pressure was held for several minutes until the area was completely hemostatic.  The tubing tract site was closed with a figure-of-eight 3-0 Vicryl stitch.  The subcutaneous tissue was closed with interrupted 3-0 Vicryl stitches.  The skin was closed with interrupted 4-0 Monocryl subcuticular stitches.  Dermabond dressings were applied.  The patient tolerated the procedure well.  At the end of the case all needle sponge and instrument counts were correct.  The patient was then awakened and taken recovery in stable condition.  PLAN OF CARE:  Discharge to home after PACU  PATIENT DISPOSITION:  PACU - hemodynamically stable.   Delay start of Pharmacological VTE agent (>24hrs) due to surgical blood loss or risk of bleeding: not applicable

## 2023-05-01 NOTE — Transfer of Care (Signed)
 Immediate Anesthesia Transfer of Care Note  Patient: Vanessa Murphy  Procedure(s) Performed: PORT REMOVAL (Left: Chest)  Patient Location: PACU  Anesthesia Type:MAC  Level of Consciousness: awake and alert   Airway & Oxygen Therapy: Patient Spontanous Breathing and Patient connected to face mask oxygen  Post-op Assessment: Report given to RN and Post -op Vital signs reviewed and stable  Post vital signs: Reviewed and stable  Last Vitals:  Vitals Value Taken Time  BP 102/68 05/01/23 0821  Temp 36.2 C 05/01/23 0821  Pulse 75 05/01/23 0823  Resp 17 05/01/23 0823  SpO2 100 % 05/01/23 0823  Vitals shown include unfiled device data.  Last Pain:  Vitals:   05/01/23 0737  TempSrc: Oral  PainSc: 6          Complications: No notable events documented.

## 2023-05-04 ENCOUNTER — Encounter (HOSPITAL_BASED_OUTPATIENT_CLINIC_OR_DEPARTMENT_OTHER): Payer: Self-pay | Admitting: General Surgery

## 2023-08-31 ENCOUNTER — Ambulatory Visit: Payer: BC Managed Care – PPO | Admitting: Hematology and Oncology

## 2023-09-16 ENCOUNTER — Telehealth: Payer: Self-pay

## 2023-09-16 NOTE — Telephone Encounter (Signed)
 Pt verbally confirmed appt for 8/7

## 2023-09-17 ENCOUNTER — Encounter: Payer: Self-pay | Admitting: Hematology and Oncology

## 2023-09-17 ENCOUNTER — Inpatient Hospital Stay

## 2023-09-17 ENCOUNTER — Inpatient Hospital Stay: Attending: Hematology and Oncology | Admitting: Hematology and Oncology

## 2023-09-17 VITALS — BP 117/80 | HR 66 | Temp 98.1°F | Resp 18 | Wt 149.0 lb

## 2023-09-17 DIAGNOSIS — C50211 Malignant neoplasm of upper-inner quadrant of right female breast: Secondary | ICD-10-CM | POA: Diagnosis not present

## 2023-09-17 DIAGNOSIS — Z808 Family history of malignant neoplasm of other organs or systems: Secondary | ICD-10-CM | POA: Insufficient documentation

## 2023-09-17 DIAGNOSIS — Z87891 Personal history of nicotine dependence: Secondary | ICD-10-CM | POA: Diagnosis not present

## 2023-09-17 DIAGNOSIS — Z803 Family history of malignant neoplasm of breast: Secondary | ICD-10-CM | POA: Insufficient documentation

## 2023-09-17 DIAGNOSIS — M8580 Other specified disorders of bone density and structure, unspecified site: Secondary | ICD-10-CM | POA: Insufficient documentation

## 2023-09-17 DIAGNOSIS — Z170 Estrogen receptor positive status [ER+]: Secondary | ICD-10-CM | POA: Diagnosis not present

## 2023-09-17 DIAGNOSIS — R5383 Other fatigue: Secondary | ICD-10-CM | POA: Insufficient documentation

## 2023-09-17 DIAGNOSIS — Z853 Personal history of malignant neoplasm of breast: Secondary | ICD-10-CM | POA: Diagnosis present

## 2023-09-17 DIAGNOSIS — R635 Abnormal weight gain: Secondary | ICD-10-CM | POA: Diagnosis not present

## 2023-09-17 DIAGNOSIS — G4700 Insomnia, unspecified: Secondary | ICD-10-CM | POA: Diagnosis not present

## 2023-09-17 LAB — CBC WITH DIFFERENTIAL/PLATELET
Abs Immature Granulocytes: 0.01 K/uL (ref 0.00–0.07)
Basophils Absolute: 0 K/uL (ref 0.0–0.1)
Basophils Relative: 1 %
Eosinophils Absolute: 0.1 K/uL (ref 0.0–0.5)
Eosinophils Relative: 2 %
HCT: 40 % (ref 36.0–46.0)
Hemoglobin: 13.8 g/dL (ref 12.0–15.0)
Immature Granulocytes: 0 %
Lymphocytes Relative: 27 %
Lymphs Abs: 1.7 K/uL (ref 0.7–4.0)
MCH: 30.4 pg (ref 26.0–34.0)
MCHC: 34.5 g/dL (ref 30.0–36.0)
MCV: 88.1 fL (ref 80.0–100.0)
Monocytes Absolute: 0.5 K/uL (ref 0.1–1.0)
Monocytes Relative: 9 %
Neutro Abs: 3.7 K/uL (ref 1.7–7.7)
Neutrophils Relative %: 61 %
Platelets: 235 K/uL (ref 150–400)
RBC: 4.54 MIL/uL (ref 3.87–5.11)
RDW: 12.5 % (ref 11.5–15.5)
WBC: 6.1 K/uL (ref 4.0–10.5)
nRBC: 0 % (ref 0.0–0.2)

## 2023-09-17 LAB — CMP (CANCER CENTER ONLY)
ALT: 23 U/L (ref 0–44)
AST: 18 U/L (ref 15–41)
Albumin: 4.7 g/dL (ref 3.5–5.0)
Alkaline Phosphatase: 91 U/L (ref 38–126)
Anion gap: 4 — ABNORMAL LOW (ref 5–15)
BUN: 9 mg/dL (ref 6–20)
CO2: 32 mmol/L (ref 22–32)
Calcium: 10 mg/dL (ref 8.9–10.3)
Chloride: 103 mmol/L (ref 98–111)
Creatinine: 0.6 mg/dL (ref 0.44–1.00)
GFR, Estimated: >60 mL/min (ref >60)
Glucose, Bld: 89 mg/dL (ref 70–99)
Potassium: 4.1 mmol/L (ref 3.5–5.1)
Sodium: 139 mmol/L (ref 135–145)
Total Bilirubin: 0.4 mg/dL (ref 0.0–1.2)
Total Protein: 7.2 g/dL (ref 6.5–8.1)

## 2023-09-17 NOTE — Assessment & Plan Note (Signed)
 Vanessa Murphy is a 43 year old woman with stage IIb right-sided breast cancer here today for follow-up and evaluation after completing chemo and radiation. She had complete pathologic response after neoadjuvant chemoimmunotherapy. She completed adjuvant radiation and declined adj tamoxifen. She had a very complicated course with arthralgias during neoad hence we omitted adj immunotherapy.   Assessment and Plan Assessment & Plan Breast cancer, status post chemotherapy and radiation She is interested in Guardant reveal testing every 6 months No concern for recurrence otherwise.  Menopausal symptoms secondary to chemotherapy Experiencing hot flashes and weight gain. Menstruation absent post-chemotherapy. Symptoms improving but persistent. Hydration and dietary management emphasized. - Encourage hydration and dietary management.  Osteopenia Osteopenia confirmed on bone density scan. Discussed importance of weight-bearing exercises and Vitamin D  and calcium supplementation. - Encourage weight-bearing exercises. - Recommend Vitamin D  and calcium supplementation.  Abnormal weight gain Gained 50 pounds. Attempting dietary changes and exercise. Concerns about weight loss program costs. Emphasized diet over exercise for weight management. - Encourage dietary management and portion control. - Discuss potential referral to weight loss clinic if financially feasible.  Fatigue and insomnia Ongoing fatigue and insomnia possibly due to menopausal symptoms and recent treatments. -

## 2023-09-17 NOTE — Progress Notes (Signed)
 [facility] Cancer Follow up:    Loretha Ash, MD [address] [place] [address]   DIAGNOSIS:  Cancer Staging  Malignant neoplasm of upper-inner quadrant of right breast in female, estrogen receptor positive (HCC) Staging form: Breast, AJCC 8th Edition - Clinical stage from 05/07/2022: Stage IIB (cT2, cN0, cM0, G3, ER+, PR-, HER2-) - Signed by Loretha Ash, MD on 03/03/2023 Stage prefix: Initial diagnosis Histologic grading system: 3 grade system Laterality: Right Staged by: Pathologist and managing physician Stage used in treatment planning: Yes National guidelines used in treatment planning: Yes Type of national guideline used in treatment planning: NCCN   SUMMARY OF ONCOLOGIC HISTORY: Oncology History  Malignant 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   04/25/2022 Pathology Results   Right breast needle core biopsy at 3:00 showed high-grade invasive ductal carcinoma.  Prognostic showed ER 40% positive weak staining intensity, PR 0% negative, group 5 HER2 negative and Ki-67 of 80%.   05/07/2022 Cancer Staging   Staging form: Breast, AJCC 8th Edition - Clinical stage from 05/07/2022: Stage IIB (cT2, cN0, cM0, G3, ER+, PR-, HER2-) - Signed by Loretha Ash, MD on 03/03/2023 Stage prefix: Initial diagnosis Histologic grading system: 3 grade system Laterality: Right Staged by: Pathologist and managing physician Stage used in treatment planning: Yes National guidelines used in treatment planning: Yes Type of national guideline used in treatment planning: NCCN   05/22/2022 - 08/13/2022 Chemotherapy   Patient is on Treatment Plan : BREAST Pembrolizumab  (200) D1 + Carboplatin  (1.5) D1,8,15 + Paclitaxel  (80) D1,8,15 q21d X 4 cycles /  Pembrolizumab  (200) D1 + AC D1 q21d x 4 cycles      Genetic Testing   [facility] Custom Cancer Panel+RNA was Negative. Report date is 05/14/2022.  The Custom Hereditary Cancers Panel offered by [facility] includes sequencing and/or deletion duplication testing of the following 43 genes: APC, ATM, AXIN2, BAP1, BARD1, BMPR1A, BRCA1, BRCA2, BRIP1, CDH1, CDK4, CDKN2A (p14ARF and p16INK4a only), CHEK2, CTNNA1, EPCAM (Deletion/duplication testing only), FH, GREM1 (promoter region duplication testing only), HOXB13, KIT, MBD4, MEN1, MLH1, MSH2, MSH3, MSH6, MUTYH, NF1, NHTL1, PALB2, PDGFRA, PMS2, POLD1, POLE, PTEN, RAD51C, RAD51D, SMAD4, SMARCA4. STK11, TP53, TSC1, TSC2, and VHL.   10/03/2022 Definitive Surgery   She had right breast lumpectomy which showed benign breast tissue with chronic inflammation, hemosiderin deposit and fibroblastic reaction consistent with biopsy site changes and treatment effects.  No evidence of residual carcinoma.  Right axillary sentinel lymph nodes were all negative without any evidence of residual carcinoma.     CURRENT THERAPY: preparing for surgery  INTERVAL HISTORY:  Natalija Bollig 43 y.o. female returns for follow-up of her right breast cancer.    Discussed the use of AI scribe software for clinical note transcription with the patient, who gave verbal consent to proceed.  History of Present Illness Oleva Marston is a 43 year old female who presents for follow-up regarding her breast cancer  and insurance issues.  She has ongoing ankle pain that has not been addressed surgically due to financial constraints. She is considering future plans with an orthopedic surgeon when financially feasible.  She recently underwent hip surgery and is currently recovering without complications. There are no issues related to the hip surgery at this time.  She discusses issues related to a port placed by  radiology and the subsequent confusion regarding its removal. She expresses  frustration over the lack of protocol for port removal and the financial burden it has caused, including a $4,000 deductible. She however feels like she wants to continue her care with me, she is satisfied with how well I took care of her.  She experiences ongoing fatigue, poor sleep, and hot flashes, which have slightly improved. She is taking a multivitamin and tries to maintain hydration. She reports a significant weight gain of 50 pounds despite eating healthily and trying to walk more.  She has not resumed menstruation since undergoing chemotherapy, which ended in July of last year. She experiences hot flashes and is concerned about the potential for her menstruation to return.  Her daughter recently graduated with honors and has chosen to join [employer], which has been a source of pride and concern for her due to the separation and the nature of Insurance claims handler.  Rest of the pertinent 10 point ROS reviewed and negative  Patient Active Problem List   Diagnosis Date Noted   Bone infarction of distal tibia, left (HCC) 11/06/2022   Nausea without vomiting 08/21/2022   Port-A-Cath in place 05/22/2022   Genetic testing 05/15/2022   Family history of breast cancer 05/07/2022   Family history of melanoma 05/07/2022   Malignant neoplasm of upper-inner quadrant of right breast in female, estrogen receptor positive (HCC) 05/06/2022   Adjustment disorder with mixed anxiety and depressed mood 03/18/2019   Anxiety 07/12/2018   Abnormal menstruation 07/23/2006   Intractable migraine 08/05/2004   DDD (degenerative disc disease), cervical 08/05/2004    is allergic to contrast media [iodinated contrast media], venlafaxine , amitriptyline, iodine, methocarbamol, and tartrazine.  MEDICAL HISTORY: Past Medical History:  Diagnosis Date   Anxiety    Cancer (HCC)    Migraines    PONV (postoperative nausea and vomiting)     SURGICAL HISTORY: Past Surgical History:  Procedure Laterality Date    BILATERAL SALPINGECTOMY     BREAST BIOPSY Right 04/25/2022   US  RT BREAST BX W LOC DEV 1ST LESION IMG BX SPEC US  GUIDE 04/25/2022 [facility]   BREAST BIOPSY Left 05/2022   BREAST BIOPSY  10/02/2022   MM RT RADIOACTIVE SEED LOC MAMMO GUIDE 10/02/2022 [facility]   BREAST LUMPECTOMY WITH RADIOACTIVE SEED AND SENTINEL LYMPH NODE BIOPSY Right 10/03/2022   Procedure: RIGHT BREAST LUMPECTOMY WITH RADIOACTIVE SEED AND SENTINEL LYMPH NODE BIOPSY;  Surgeon: Curvin Deward MOULD, MD;  Location: [facility];  Service: General;  Laterality: Right;  PEC BLOCK   IR IMAGING GUIDED PORT INSERTION  05/21/2022   PORT-A-CATH REMOVAL Left 05/01/2023   Procedure: PORT REMOVAL;  Surgeon: Curvin Deward MOULD, MD;  Location: [facility];  Service: General;  Laterality: Left;    SOCIAL HISTORY: Social History   Socioeconomic History   Marital status: Single    Spouse name: Not on file   Number of children: Not on file   Years of education: Not on file   Highest education level: Not on file  Occupational History   Not on file  Tobacco Use   Smoking status: Former    Current packs/day: 0.00    Average packs/day: 0.5 packs/day for 24.2 years (12.1 ttl pk-yrs)    Types: Cigarettes    Start date: 2000    Quit date: 05/07/2022    Years since quitting: 1.3    Passive exposure: Past   Smokeless tobacco: Never   Tobacco comments:  Quit 05/07/2022  Vaping Use   Vaping status: Never Used  Substance and Sexual Activity   Alcohol use: Not Currently   Drug use: Never   Sexual activity: Yes    Birth control/protection: Surgical    Comment: MRI 05-16-22 Left Breast Lump found  Other Topics Concern   Not on file  Social History Narrative   Not on file   Social Drivers of Health   Financial Resource Strain: Low Risk  (11/25/2022)   Received from [facility]   Overall Financial Resource Strain (CARDIA)    Difficulty of Paying Living Expenses: Not very hard  Food Insecurity: No  Food Insecurity (01/13/2023)   Received from [facility]   Hunger Vital Sign    Within the past 12 months, you worried that your food would run out before you got the money to buy more.: Never true    Within the past 12 months, the food you bought just didn't last and you didn't have money to get more.: Never true  Transportation Needs: No Transportation Needs (01/13/2023)   Received from [facility] - Transportation    Lack of Transportation (Medical): No    Lack of Transportation (Non-Medical): No  Physical Activity: Unknown (11/25/2022)   Received from [facility]   Exercise Vital Sign    On average, how many days per week do you engage in moderate to strenuous exercise (like a brisk walk)?: 0 days    Minutes of Exercise per Session: Not on file  Stress: No Stress Concern Present (01/13/2023)   Received from [facility] - Occupational Stress Questionnaire    Feeling of Stress : Only a little  Social Connections: Somewhat Isolated (11/25/2022)   Received from [facility]   Social Network    How would you rate your social network (family, work, friends)?: Restricted participation with some degree of social isolation  Intimate Partner Violence: Not At Risk (01/13/2023)   Received from [facility]   HITS    Over the last 12 months how often did your partner physically hurt you?: Never    Over the last 12 months how often did your partner insult you or talk down to you?: Never    Over the last 12 months how often did your partner threaten you with physical harm?: Never    Over the last 12 months how often did your partner scream or curse at you?: Never    FAMILY HISTORY: Family History  Problem Relation Age of Onset   Migraines Mother    Melanoma Father 40 - 33       dx. again in his early 70s   Breast cancer Paternal Grandmother 31 - 10    PHYSICAL EXAMINATION   Onc Performance Status - 09/17/23 0800       KPS  SCALE   KPS % SCORE Normal, no compliants, no evidence of disease         Vitals:   09/17/23 0847  BP: 117/80  Pulse: 66  Resp: 18  Temp: 98.1 F (36.7 C)  SpO2: 100%    Physical Exam Constitutional:      General: She is not in acute distress.    Appearance: Normal appearance. She is not toxic-appearing.  HENT:     Head: Normocephalic and atraumatic.     Mouth/Throat:     Mouth: Mucous membranes are moist.     Pharynx: Oropharynx is clear. No oropharyngeal exudate or posterior  oropharyngeal erythema.  Eyes:     General: No scleral icterus. Cardiovascular:     Rate and Rhythm: Normal rate and regular rhythm.     Pulses: Normal pulses.     Heart sounds: Normal heart sounds.  Pulmonary:     Effort: Pulmonary effort is normal.     Breath sounds: Normal breath sounds.  Chest:     Comments: Bilateral breasts inspected. Right breast post rad changes and surgical changes. No palpable masses. No regional adenopathy Left breast inspected and palpated. No concerns. Abdominal:     General: Abdomen is flat. Bowel sounds are normal. There is no distension.     Palpations: Abdomen is soft.     Tenderness: There is no abdominal tenderness.  Musculoskeletal:        General: No swelling.     Cervical back: Neck supple.  Lymphadenopathy:     Cervical: No cervical adenopathy.  Skin:    General: Skin is warm and dry.     Findings: No rash.  Neurological:     General: No focal deficit present.     Mental Status: She is alert.  Psychiatric:        Mood and Affect: Mood normal.        Behavior: Behavior normal.     LABORATORY DATA:  CBC    Component Value Date/Time   WBC 7.4 11/06/2022 1129   RBC 4.41 11/06/2022 1129   HGB 13.0 11/06/2022 1129   HGB 11.9 (L) 09/25/2022 1130   HCT 40.0 11/06/2022 1129   PLT 298.0 11/06/2022 1129   PLT 236 09/25/2022 1130   MCV 90.7 11/06/2022 1129   MCH 32.3 09/25/2022 1130   MCHC 32.5 11/06/2022 1129   RDW 14.9 11/06/2022 1129    LYMPHSABS 2.1 11/06/2022 1129   MONOABS 0.6 11/06/2022 1129   EOSABS 0.1 11/06/2022 1129   BASOSABS 0.0 11/06/2022 1129    CMP     Component Value Date/Time   NA 141 11/06/2022 1129   K 3.7 11/06/2022 1129   CL 103 11/06/2022 1129   CO2 30 11/06/2022 1129   GLUCOSE 94 11/06/2022 1129   BUN 8 11/06/2022 1129   CREATININE 0.69 11/06/2022 1129   CREATININE 0.70 09/25/2022 1130   CALCIUM 10.3 (H) 11/06/2022 1129   CALCIUM 10.4 11/06/2022 1129   PROT 7.3 11/06/2022 1129   ALBUMIN 4.5 11/06/2022 1129   AST 23 11/06/2022 1129   AST 18 09/25/2022 1130   ALT 27 11/06/2022 1129   ALT 23 09/25/2022 1130   ALKPHOS 93 11/06/2022 1129   BILITOT 0.3 11/06/2022 1129   BILITOT 0.3 09/25/2022 1130   GFRNONAA >60 09/25/2022 1130       PENDING LABS:   RADIOGRAPHIC STUDIES:  No results found.   PATHOLOGY:     ASSESSMENT and THERAPY PLAN:   Malignant neoplasm of upper-inner quadrant of right breast in female, estrogen receptor positive (HCC) Destiny is a 43 year old woman with stage IIb right-sided breast cancer here today for follow-up and evaluation after completing chemo and radiation. She had complete pathologic response after neoadjuvant chemoimmunotherapy. She completed adjuvant radiation and declined adj tamoxifen. She had a very complicated course with arthralgias during neoad hence we omitted adj immunotherapy.   Assessment and Plan Assessment & Plan Breast cancer, status post chemotherapy and radiation She is interested in Guardant reveal testing every 6 months No concern for recurrence otherwise.  Menopausal symptoms secondary to chemotherapy Experiencing hot flashes and weight gain. Menstruation absent post-chemotherapy. Symptoms improving  but persistent. Hydration and dietary management emphasized. - Encourage hydration and dietary management.  Osteopenia Osteopenia confirmed on bone density scan. Discussed importance of weight-bearing exercises and Vitamin D   and calcium supplementation. - Encourage weight-bearing exercises. - Recommend Vitamin D  and calcium supplementation.  Abnormal weight gain Gained 50 pounds. Attempting dietary changes and exercise. Concerns about weight loss program costs. Emphasized diet over exercise for weight management. - Encourage dietary management and portion control. - Discuss potential referral to weight loss clinic if financially feasible.  Fatigue and insomnia Ongoing fatigue and insomnia possibly due to menopausal symptoms and recent treatments. -    All questions were answered. The patient knows to call the clinic with any problems, questions or concerns. We can certainly see the patient much sooner if necessary.  Total encounter time:30 minutes*in face-to-face visit time, chart review, lab review, care coordination, order entry, and documentation of the encounter time.    *Total Encounter Time as defined by the Centers for Medicare and Medicaid Services includes, in addition to the face-to-face time of a patient visit (documented in the note above) non-face-to-face time: obtaining and reviewing outside history, ordering and reviewing medications, tests or procedures, care coordination (communications with other health care professionals or caregivers) and documentation in the medical record.

## 2023-09-24 ENCOUNTER — Encounter: Payer: Self-pay | Admitting: *Deleted

## 2023-09-30 ENCOUNTER — Encounter: Payer: Self-pay | Admitting: Hematology and Oncology

## 2023-10-02 LAB — GUARDANT REVEAL

## 2023-10-05 ENCOUNTER — Ambulatory Visit: Payer: Self-pay | Admitting: Hematology and Oncology

## 2023-11-23 NOTE — Progress Notes (Signed)
 SURVIVORSHIP VISIT:  BRIEF ONCOLOGIC HISTORY:  Oncology History  Malignant 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   04/25/2022 Pathology Results   Right breast needle core biopsy at 3:00 showed high-grade invasive ductal carcinoma.  Prognostic showed ER 40% positive weak staining intensity, PR 0% negative, group 5 HER2 negative and Ki-67 of 80%.   05/07/2022 Cancer Staging   Staging form: Breast, AJCC 8th Edition - Clinical stage from 05/07/2022: Stage IIB (cT2, cN0, cM0, G3, ER+, PR-, HER2-) - Signed by Loretha Ash, MD on 03/03/2023 Stage prefix: Initial diagnosis Histologic grading system: 3 grade system Laterality: Right Staged by: Pathologist and managing physician Stage used in treatment planning: Yes National guidelines used in treatment planning: Yes Type of national guideline used in treatment planning: NCCN   05/22/2022 - 08/13/2022 Chemotherapy   Patient is on Treatment Plan : BREAST Pembrolizumab  (200) D1 + Carboplatin  (1.5) D1,8,15 + Paclitaxel  (80) D1,8,15 q21d X 4 cycles / Pembrolizumab  (200) D1 + AC D1 q21d x 4 cycles      Genetic Testing   [facility] Custom Cancer Panel+RNA was Negative. Report date is 05/14/2022.  The Custom Hereditary Cancers Panel offered by [facility] includes sequencing and/or deletion duplication testing of the following 43 genes: APC, ATM, AXIN2, BAP1, BARD1, BMPR1A, BRCA1, BRCA2, BRIP1, CDH1, CDK4, CDKN2A (p14ARF and p16INK4a only), CHEK2, CTNNA1, EPCAM (Deletion/duplication testing only), FH, GREM1 (promoter region duplication testing only), HOXB13, KIT, MBD4, MEN1, MLH1, MSH2, MSH3, MSH6, MUTYH, NF1, NHTL1, PALB2, PDGFRA, PMS2, POLD1, POLE, PTEN, RAD51C, RAD51D, SMAD4, SMARCA4. STK11, TP53, TSC1, TSC2, and VHL.   10/03/2022 Definitive  Surgery   She had right breast lumpectomy which showed benign breast tissue with chronic inflammation, hemosiderin deposit and fibroblastic reaction consistent with biopsy site changes and treatment effects.  No evidence of residual carcinoma.  Right axillary sentinel lymph nodes were all negative without any evidence of residual carcinoma.   11/13/2022 - 12/29/2022 Radiation Therapy   Plan Name: Breast_R Site: Breast, Right Technique: 3D Mode: Photon Dose Per Fraction: 1.8 Gy Prescribed Dose (Delivered / Prescribed): 50.4 Gy / 50.4 Gy Prescribed Fxs (Delivered / Prescribed): 28 / 28   Plan Name: Breast_R_Bst Site: Breast, Right Technique: 3D Mode: Photon Dose Per Fraction: 2 Gy Prescribed Dose (Delivered / Prescribed): 10 Gy / 10 Gy Prescribed Fxs (Delivered / Prescribed): 5 / 5     INTERVAL HISTORY:  Ms. Fellner to review her survivorship care plan detailing her treatment course for breast cancer, as well as monitoring long-term side effects of that treatment, education regarding health maintenance, screening, and overall wellness and health promotion.     Overall, Ms. Scarberry reports feeling quite well   REVIEW OF SYSTEMS:  Review of Systems - Oncology Breast: Denies any new nodularity, masses, tenderness, nipple changes, or nipple discharge.       PAST MEDICAL/SURGICAL HISTORY:  Past Medical History:  Diagnosis Date   Anxiety    Cancer (HCC)    Migraines    PONV (postoperative nausea and vomiting)    Past Surgical History:  Procedure Laterality Date   BILATERAL SALPINGECTOMY     BREAST BIOPSY Right 04/25/2022   US  RT BREAST BX W LOC DEV 1ST LESION IMG BX SPEC US  GUIDE 04/25/2022 [facility]   BREAST BIOPSY Left 05/2022   BREAST BIOPSY  10/02/2022  MM RT RADIOACTIVE SEED LOC MAMMO GUIDE 10/02/2022 [facility]MO[facility]   BREAST LUMPECTOMY WITH RADIOACTIVE SEED AND SENTINEL LYMPH NODE BIOPSY Right 10/03/2022   Procedure: RIGHT BREAST LUMPECTOMY WITH  RADIOACTIVE SEED AND SENTINEL LYMPH NODE BIOPSY;  Surgeon: Curvin Deward MOULD, MD;  Location: [facility];  Service: General;  Laterality: Right;  PEC BLOCK   IR IMAGING GUIDED PORT INSERTION  05/21/2022   PORT-A-CATH REMOVAL Left 05/01/2023   Procedure: PORT REMOVAL;  Surgeon: Curvin Deward III, MD;  Location: [facility];  Service: General;  Laterality: Left;     ALLERGIES:  Allergies  Allergen Reactions   Contrast Media [Iodinated Contrast Media] Nausea And Vomiting    Skin feeling hot   Venlafaxine  Nausea And Vomiting    Severe nausea and diarrhea   Amitriptyline Other (See Comments)    Confusion, sleeplessness   Iodine Hives   Methocarbamol     swollen eyes and throat   Tartrazine     Other Reaction(s): *Unknown  ? Dye in Doritos, Cheetos and [employer]  Other reaction(s): *Unknown  ? Dye in Doritos, Cheetos and [employer]     CURRENT MEDICATIONS:  Outpatient Encounter Medications as of 11/24/2023  Medication Sig Note   alendronate  (FOSAMAX ) 70 MG tablet Take 1 tablet (70 mg total) by mouth once a week. Take with a full glass of water  on an empty stomach.    fluticasone (FLONASE) 50 MCG/ACT nasal spray Place 2 sprays into both nostrils daily. OTC from [employer] (Patient not taking: Reported on 09/17/2023)    lidocaine  (XYLOCAINE ) 2 % solution Use as directed 15 mLs in the mouth or throat as needed for mouth pain.    magic mouthwash (multi-ingredient) oral suspension Swish and swallow 5ml  4 times daily as needed for throat discomfort    meloxicam  (MOBIC ) 15 MG tablet TAKE 1 TABLET(15 MG) BY MOUTH DAILY    OLANZapine  (ZYPREXA ) 5 MG tablet TAKE 1 TABLET(5 MG) BY MOUTH AT BEDTIME 05/01/2023: Pt states not taking   predniSONE  (DELTASONE ) 10 MG tablet Take 1 tablet (10 mg total) by mouth daily with breakfast. (Patient taking differently: Take 5 mg by mouth daily with breakfast.)    tiZANidine  (ZANAFLEX ) 2 MG tablet TAKE 1 TABLET(2 MG) BY MOUTH AT BEDTIME AS  NEEDED FOR MUSCLE SPASMS    traMADol  (ULTRAM ) 50 MG tablet Take 1 tablet (50 mg total) by mouth every 6 (six) hours as needed.    VEOZAH  45 MG TABS TAKE 1 TABLET(45 MG) BY MOUTH DAILY 05/01/2023: Pt states not taking   Vitamin D , Ergocalciferol , (DRISDOL ) 1.25 MG (50000 UNIT) CAPS capsule TAKE 1 CAPSULE EVERY 7 DAYS    zolpidem  (AMBIEN ) 5 MG tablet Take 1 tablet (5 mg total) by mouth at bedtime as needed for sleep.    No facility-administered encounter medications on file as of 11/24/2023.     ONCOLOGIC FAMILY HISTORY:  Family History  Problem Relation Age of Onset   Migraines Mother    Melanoma Father 45 - 77       dx. again in his early 18s   Breast cancer Paternal Grandmother 80 - 43     SOCIAL HISTORY:  Social History   Socioeconomic History   Marital status: Single    Spouse name: Not on file   Number of children: Not on file   Years of education: Not on file   Highest education level: Not on file  Occupational History   Not on file  Tobacco Use  Smoking status: Former    Current packs/day: 0.00    Average packs/day: 0.5 packs/day for 24.2 years (12.1 ttl pk-yrs)    Types: Cigarettes    Start date: 2000    Quit date: 05/07/2022    Years since quitting: 1.5    Passive exposure: Past   Smokeless tobacco: Never   Tobacco comments:    Quit 05/07/2022  Vaping Use   Vaping status: Never Used  Substance and Sexual Activity   Alcohol use: Not Currently   Drug use: Never   Sexual activity: Yes    Birth control/protection: Surgical    Comment: MRI 05-16-22 Left Breast Lump found  Other Topics Concern   Not on file  Social History Narrative   Not on file   Social Drivers of Health   Financial Resource Strain: Low Risk  (11/25/2022)   Received from [facility]   Overall Financial Resource Strain (CARDIA)    Difficulty of Paying Living Expenses: Not very hard  Food Insecurity: No Food Insecurity (01/13/2023)   Received from [facility]   Hunger Vital Sign     Within the past 12 months, you worried that your food would run out before you got the money to buy more.: Never true    Within the past 12 months, the food you bought just didn't last and you didn't have money to get more.: Never true  Transportation Needs: No Transportation Needs (01/13/2023)   Received from [facility] - Transportation    Lack of Transportation (Medical): No    Lack of Transportation (Non-Medical): No  Physical Activity: Unknown (11/25/2022)   Received from [facility]   Exercise Vital Sign    On average, how many days per week do you engage in moderate to strenuous exercise (like a brisk walk)?: 0 days    Minutes of Exercise per Session: Not on file  Stress: No Stress Concern Present (01/13/2023)   Received from [facility] - Occupational Stress Questionnaire    Feeling of Stress : Only a little  Social Connections: Somewhat Isolated (11/25/2022)   Received from [facility]   Social Network    How would you rate your social network (family, work, friends)?: Restricted participation with some degree of social isolation  Intimate Partner Violence: Not At Risk (01/13/2023)   Received from [facility]   HITS    Over the last 12 months how often did your partner physically hurt you?: Never    Over the last 12 months how often did your partner insult you or talk down to you?: Never    Over the last 12 months how often did your partner threaten you with physical harm?: Never    Over the last 12 months how often did your partner scream or curse at you?: Never     OBSERVATIONS/OBJECTIVE:  There were no vitals taken for this visit. GENERAL: Patient is a well appearing female in no acute distress HEENT:  Sclerae anicteric.  Oropharynx clear and moist. No ulcerations or evidence of oropharyngeal candidiasis. Neck is supple.  NODES:  No cervical, supraclavicular, or axillary lymphadenopathy palpated.  BREAST EXAM:   Deferred. LUNGS:  Clear to auscultation bilaterally.  No wheezes or rhonchi. HEART:  Regular rate and rhythm. No murmur appreciated. ABDOMEN:  Soft, nontender.  Positive, normoactive bowel sounds. No organomegaly palpated. MSK:  No focal spinal tenderness to palpation. Full range of motion bilaterally in the upper extremities. EXTREMITIES:  No  peripheral edema.   SKIN:  Clear with no obvious rashes or skin changes. No nail dyscrasia. NEURO:  Nonfocal. Well oriented.  Appropriate affect.   LABORATORY DATA:  None for this visit.  DIAGNOSTIC IMAGING:  None for this visit.      ASSESSMENT AND PLAN:  Ms.. Somes is a pleasant 43 y.o. female with Stage IIB right breast invasive ductal carcinoma, ER+/PR+/HER2-, diagnosed in ***, treated with lumpectomy, adjuvant radiation therapy, and anti-estrogen therapy with *** beginning in ***.  She presents to the Survivorship Clinic for our initial meeting and routine follow-up post-completion of treatment for breast cancer.    1. Stage *** right/left breast cancer:  Ms. Speckman is continuing to recover from definitive treatment for breast cancer. She will follow-up with her medical oncologist, Dr.  PIERRETTE with history and physical exam per surveillance protocol.  She will continue her anti-estrogen therapy with ***. Thus far, she is tolerating the *** well, with minimal side effects. Her mammogram is due ***; orders placed today.   Today, a comprehensive survivorship care plan and treatment summary was reviewed with the patient today detailing her breast cancer diagnosis, treatment course, potential late/long-term effects of treatment, appropriate follow-up care with recommendations for the future, and patient education resources.  A copy of this summary, along with a letter will be sent to the patient's primary care provider via mail/fax/In Basket message after today's visit.    #. Problem(s) at Visit______________  #. Bone health:  Given Ms.  Boeckman's age/history of breast cancer and her current treatment regimen including anti-estrogen therapy with ***, she is at risk for bone demineralization.  Her last DEXA scan was ***, which showed ***.  In the meantime, she was encouraged to increase her consumption of foods rich in calcium, as well as increase her weight-bearing activities.  She was given education on specific activities to promote bone health.  #. Cancer screening:  Due to Ms. Joshi's history and her age, she should receive screening for skin cancers, colon cancer, and gynecologic cancers.  The information and recommendations are listed on the patient's comprehensive care plan/treatment summary and were reviewed in detail with the patient.    #. Health maintenance and wellness promotion: Ms. Fronek was encouraged to consume 5-7 servings of fruits and vegetables per day. We reviewed the [employer] handout.  She was also encouraged to engage in moderate to vigorous exercise for 30 minutes per day most days of the week.  She was instructed to limit her alcohol consumption and continue to abstain from tobacco use/***was encouraged stop smoking.     #. Support services/counseling: It is not uncommon for this period of the patient's cancer care trajectory to be one of many emotions and stressors.   She was given information regarding our available services and encouraged to contact me with any questions or for help enrolling in any of our support group/programs.    Follow up instructions:    -Return to cancer center ***  -Mammogram due in *** -She is welcome to return back to the Survivorship Clinic at any time; no additional follow-up needed at this time.  -Consider referral back to survivorship as a long-term survivor for continued surveillance  The patient was provided an opportunity to ask questions and all were answered. The patient agreed with the plan and demonstrated an understanding of the instructions.    Total encounter time:*** minutes*in face-to-face visit time, chart review, lab review, care coordination, order entry, and documentation of the encounter time.  Morna Kendall, NP 11/23/23 4:03 PM Medical Oncology and Hematology [facility] [address] [place], [place] [address] Tel. [phone]    Fax. [phone]  *Total Encounter Time as defined by the Centers for Medicare and Medicaid Services includes, in addition to the face-to-face time of a patient visit (documented in the note above) non-face-to-face time: obtaining and reviewing outside history, ordering and reviewing medications, tests or procedures, care coordination (communications with other health care professionals or caregivers) and documentation in the medical record.

## 2023-11-24 ENCOUNTER — Inpatient Hospital Stay: Attending: Hematology and Oncology | Admitting: Adult Health

## 2023-11-24 ENCOUNTER — Encounter: Payer: Self-pay | Admitting: Adult Health

## 2023-11-24 VITALS — BP 110/69 | HR 68 | Temp 98.3°F | Resp 17 | Wt 154.5 lb

## 2023-11-24 DIAGNOSIS — Z9221 Personal history of antineoplastic chemotherapy: Secondary | ICD-10-CM | POA: Diagnosis not present

## 2023-11-24 DIAGNOSIS — C50211 Malignant neoplasm of upper-inner quadrant of right female breast: Secondary | ICD-10-CM | POA: Diagnosis not present

## 2023-11-24 DIAGNOSIS — Z923 Personal history of irradiation: Secondary | ICD-10-CM | POA: Insufficient documentation

## 2023-11-24 DIAGNOSIS — Z1231 Encounter for screening mammogram for malignant neoplasm of breast: Secondary | ICD-10-CM | POA: Diagnosis not present

## 2023-11-24 DIAGNOSIS — Z808 Family history of malignant neoplasm of other organs or systems: Secondary | ICD-10-CM | POA: Insufficient documentation

## 2023-11-24 DIAGNOSIS — Z853 Personal history of malignant neoplasm of breast: Secondary | ICD-10-CM | POA: Insufficient documentation

## 2023-11-24 DIAGNOSIS — Z170 Estrogen receptor positive status [ER+]: Secondary | ICD-10-CM | POA: Diagnosis not present

## 2023-11-24 DIAGNOSIS — Z87891 Personal history of nicotine dependence: Secondary | ICD-10-CM | POA: Insufficient documentation

## 2023-11-24 DIAGNOSIS — Z803 Family history of malignant neoplasm of breast: Secondary | ICD-10-CM | POA: Diagnosis not present

## 2023-11-26 ENCOUNTER — Encounter: Payer: Self-pay | Admitting: Adult Health

## 2023-12-02 ENCOUNTER — Encounter: Payer: Self-pay | Admitting: Adult Health

## 2023-12-03 ENCOUNTER — Other Ambulatory Visit: Payer: Self-pay | Admitting: Adult Health

## 2023-12-03 DIAGNOSIS — C50211 Malignant neoplasm of upper-inner quadrant of right female breast: Secondary | ICD-10-CM

## 2023-12-07 ENCOUNTER — Encounter: Payer: Self-pay | Admitting: Hematology and Oncology

## 2023-12-08 ENCOUNTER — Other Ambulatory Visit: Payer: Self-pay

## 2023-12-08 DIAGNOSIS — Z170 Estrogen receptor positive status [ER+]: Secondary | ICD-10-CM

## 2023-12-10 ENCOUNTER — Ambulatory Visit

## 2023-12-14 ENCOUNTER — Other Ambulatory Visit: Payer: Self-pay | Admitting: Family Medicine

## 2023-12-14 DIAGNOSIS — E559 Vitamin D deficiency, unspecified: Secondary | ICD-10-CM

## 2023-12-25 ENCOUNTER — Ambulatory Visit

## 2023-12-29 ENCOUNTER — Encounter: Payer: Self-pay | Admitting: Adult Health

## 2024-01-20 ENCOUNTER — Ambulatory Visit
Admission: RE | Admit: 2024-01-20 | Discharge: 2024-01-20 | Disposition: A | Discharge status: Home / Self Care | Source: Ambulatory Visit | Attending: Adult Health | Admitting: Adult Health

## 2024-01-20 DIAGNOSIS — C50211 Malignant neoplasm of upper-inner quadrant of right female breast: Secondary | ICD-10-CM

## 2024-03-15 ENCOUNTER — Inpatient Hospital Stay

## 2024-03-15 ENCOUNTER — Inpatient Hospital Stay: Admitting: Hematology and Oncology

## 2024-03-15 VITALS — BP 108/82 | HR 63 | Temp 98.4°F | Resp 17 | Ht 64.0 in | Wt 157.0 lb

## 2024-03-15 DIAGNOSIS — Z170 Estrogen receptor positive status [ER+]: Secondary | ICD-10-CM

## 2024-03-15 DIAGNOSIS — C50211 Malignant neoplasm of upper-inner quadrant of right female breast: Secondary | ICD-10-CM | POA: Diagnosis not present

## 2024-03-15 LAB — CBC WITH DIFFERENTIAL/PLATELET
Abs Immature Granulocytes: 0.01 10*3/uL (ref 0.00–0.07)
Basophils Absolute: 0 10*3/uL (ref 0.0–0.1)
Basophils Relative: 1 %
Eosinophils Absolute: 0.1 10*3/uL (ref 0.0–0.5)
Eosinophils Relative: 2 %
HCT: 38 % (ref 36.0–46.0)
Hemoglobin: 12.9 g/dL (ref 12.0–15.0)
Immature Granulocytes: 0 %
Lymphocytes Relative: 25 %
Lymphs Abs: 1.6 10*3/uL (ref 0.7–4.0)
MCH: 30 pg (ref 26.0–34.0)
MCHC: 33.9 g/dL (ref 30.0–36.0)
MCV: 88.4 fL (ref 80.0–100.0)
Monocytes Absolute: 0.5 10*3/uL (ref 0.1–1.0)
Monocytes Relative: 8 %
Neutro Abs: 4.2 10*3/uL (ref 1.7–7.7)
Neutrophils Relative %: 64 %
Platelets: 237 10*3/uL (ref 150–400)
RBC: 4.3 MIL/uL (ref 3.87–5.11)
RDW: 12.6 % (ref 11.5–15.5)
WBC: 6.4 10*3/uL (ref 4.0–10.5)
nRBC: 0 % (ref 0.0–0.2)

## 2024-09-13 ENCOUNTER — Inpatient Hospital Stay: Admitting: Hematology and Oncology

## 2024-09-13 ENCOUNTER — Inpatient Hospital Stay
